# Patient Record
Sex: Female | Born: 1965 | Race: White | Hispanic: No | Marital: Married
Health system: Southern US, Community
[De-identification: ages and names within clinical notes are randomized; demographics above are authoritative.]

## PROBLEM LIST (undated history)

## (undated) DIAGNOSIS — R251 Tremor, unspecified: Secondary | ICD-10-CM

## (undated) DIAGNOSIS — F99 Mental disorder, not otherwise specified: Secondary | ICD-10-CM

## (undated) DIAGNOSIS — IMO0002 Reserved for concepts with insufficient information to code with codable children: Secondary | ICD-10-CM

## (undated) DIAGNOSIS — G894 Chronic pain syndrome: Secondary | ICD-10-CM

## (undated) DIAGNOSIS — F32A Depression, unspecified: Secondary | ICD-10-CM

## (undated) DIAGNOSIS — Z889 Allergy status to unspecified drugs, medicaments and biological substances status: Secondary | ICD-10-CM

## (undated) DIAGNOSIS — R202 Paresthesia of skin: Secondary | ICD-10-CM

## (undated) DIAGNOSIS — M21612 Bunion of left foot: Secondary | ICD-10-CM

## (undated) DIAGNOSIS — IMO0001 Reserved for inherently not codable concepts without codable children: Secondary | ICD-10-CM

## (undated) DIAGNOSIS — N809 Endometriosis, unspecified: Secondary | ICD-10-CM

## (undated) DIAGNOSIS — F419 Anxiety disorder, unspecified: Secondary | ICD-10-CM

## (undated) DIAGNOSIS — F410 Panic disorder [episodic paroxysmal anxiety] without agoraphobia: Secondary | ICD-10-CM

## (undated) DIAGNOSIS — R87619 Unspecified abnormal cytological findings in specimens from cervix uteri: Secondary | ICD-10-CM

## (undated) DIAGNOSIS — R87629 Unspecified abnormal cytological findings in specimens from vagina: Secondary | ICD-10-CM

## (undated) DIAGNOSIS — K219 Gastro-esophageal reflux disease without esophagitis: Secondary | ICD-10-CM

## (undated) DIAGNOSIS — C801 Malignant (primary) neoplasm, unspecified: Secondary | ICD-10-CM

## (undated) DIAGNOSIS — R200 Anesthesia of skin: Secondary | ICD-10-CM

## (undated) DIAGNOSIS — Z22322 Carrier or suspected carrier of Methicillin resistant Staphylococcus aureus: Secondary | ICD-10-CM

## (undated) DIAGNOSIS — F329 Major depressive disorder, single episode, unspecified: Secondary | ICD-10-CM

## (undated) HISTORY — DX: Endometriosis, unspecified: N80.9

## (undated) HISTORY — DX: Unspecified abnormal cytological findings in specimens from cervix uteri: R87.619

## (undated) HISTORY — DX: Chronic pain syndrome: G89.4

## (undated) HISTORY — DX: Carrier or suspected carrier of methicillin resistant Staphylococcus aureus: Z22.322

## (undated) HISTORY — PX: DILATION AND CURETTAGE OF UTERUS: SHX78

## (undated) HISTORY — PX: ROTATOR CUFF REPAIR: SHX139

## (undated) HISTORY — PX: WISDOM TOOTH EXTRACTION: SHX21

---

## 1997-12-29 ENCOUNTER — Inpatient Hospital Stay (HOSPITAL_COMMUNITY): Admission: AD | Admit: 1997-12-29 | Discharge: 1997-12-31 | Payer: Self-pay | Admitting: Obstetrics & Gynecology

## 1998-04-09 HISTORY — PX: NASAL SINUS SURGERY: SHX719

## 1998-11-04 ENCOUNTER — Other Ambulatory Visit: Admission: RE | Admit: 1998-11-04 | Discharge: 1998-11-04 | Payer: Self-pay | Admitting: *Deleted

## 1998-11-04 ENCOUNTER — Encounter (INDEPENDENT_AMBULATORY_CARE_PROVIDER_SITE_OTHER): Payer: Self-pay | Admitting: Specialist

## 1999-06-08 ENCOUNTER — Other Ambulatory Visit: Admission: RE | Admit: 1999-06-08 | Discharge: 1999-06-08 | Payer: Self-pay | Admitting: Obstetrics & Gynecology

## 1999-07-16 ENCOUNTER — Emergency Department (HOSPITAL_COMMUNITY): Admission: EM | Admit: 1999-07-16 | Discharge: 1999-07-16 | Payer: Self-pay | Admitting: Emergency Medicine

## 1999-10-13 ENCOUNTER — Encounter: Payer: Self-pay | Admitting: Neurosurgery

## 1999-10-13 ENCOUNTER — Ambulatory Visit (HOSPITAL_COMMUNITY): Admission: RE | Admit: 1999-10-13 | Discharge: 1999-10-13 | Payer: Self-pay | Admitting: Neurosurgery

## 1999-12-07 ENCOUNTER — Encounter: Payer: Self-pay | Admitting: Neurosurgery

## 1999-12-07 ENCOUNTER — Ambulatory Visit (HOSPITAL_COMMUNITY): Admission: RE | Admit: 1999-12-07 | Discharge: 1999-12-07 | Payer: Self-pay | Admitting: Neurosurgery

## 1999-12-09 HISTORY — PX: NECK SURGERY: SHX720

## 1999-12-22 ENCOUNTER — Inpatient Hospital Stay (HOSPITAL_COMMUNITY): Admission: RE | Admit: 1999-12-22 | Discharge: 1999-12-23 | Payer: Self-pay | Admitting: Neurosurgery

## 1999-12-22 ENCOUNTER — Encounter: Payer: Self-pay | Admitting: Neurosurgery

## 2000-01-12 ENCOUNTER — Encounter: Admission: RE | Admit: 2000-01-12 | Discharge: 2000-01-12 | Payer: Self-pay | Admitting: Neurosurgery

## 2000-01-12 ENCOUNTER — Encounter: Payer: Self-pay | Admitting: Neurosurgery

## 2000-02-22 ENCOUNTER — Other Ambulatory Visit (HOSPITAL_COMMUNITY): Admission: RE | Admit: 2000-02-22 | Discharge: 2000-03-18 | Payer: Self-pay | Admitting: Psychiatry

## 2000-10-03 ENCOUNTER — Other Ambulatory Visit: Admission: RE | Admit: 2000-10-03 | Discharge: 2000-10-03 | Payer: Self-pay | Admitting: Obstetrics & Gynecology

## 2000-10-07 HISTORY — PX: TUBAL LIGATION: SHX77

## 2000-10-31 ENCOUNTER — Ambulatory Visit (HOSPITAL_COMMUNITY): Admission: RE | Admit: 2000-10-31 | Discharge: 2000-10-31 | Payer: Self-pay | Admitting: Obstetrics and Gynecology

## 2000-10-31 ENCOUNTER — Encounter (INDEPENDENT_AMBULATORY_CARE_PROVIDER_SITE_OTHER): Payer: Self-pay | Admitting: *Deleted

## 2002-03-07 ENCOUNTER — Emergency Department (HOSPITAL_COMMUNITY): Admission: EM | Admit: 2002-03-07 | Discharge: 2002-03-08 | Payer: Self-pay | Admitting: Emergency Medicine

## 2002-07-01 ENCOUNTER — Encounter: Payer: Self-pay | Admitting: Emergency Medicine

## 2002-07-01 ENCOUNTER — Emergency Department (HOSPITAL_COMMUNITY): Admission: EM | Admit: 2002-07-01 | Discharge: 2002-07-01 | Payer: Self-pay | Admitting: Emergency Medicine

## 2002-08-26 ENCOUNTER — Emergency Department (HOSPITAL_COMMUNITY): Admission: EM | Admit: 2002-08-26 | Discharge: 2002-08-26 | Payer: Self-pay

## 2003-06-17 ENCOUNTER — Emergency Department (HOSPITAL_COMMUNITY): Admission: EM | Admit: 2003-06-17 | Discharge: 2003-06-17 | Payer: Self-pay | Admitting: *Deleted

## 2003-07-29 ENCOUNTER — Emergency Department (HOSPITAL_COMMUNITY): Admission: EM | Admit: 2003-07-29 | Discharge: 2003-07-29 | Payer: Self-pay | Admitting: Emergency Medicine

## 2003-09-02 ENCOUNTER — Emergency Department (HOSPITAL_COMMUNITY): Admission: EM | Admit: 2003-09-02 | Discharge: 2003-09-02 | Payer: Self-pay | Admitting: Emergency Medicine

## 2003-09-10 ENCOUNTER — Emergency Department (HOSPITAL_COMMUNITY): Admission: EM | Admit: 2003-09-10 | Discharge: 2003-09-10 | Payer: Self-pay | Admitting: Emergency Medicine

## 2004-06-12 ENCOUNTER — Emergency Department (HOSPITAL_COMMUNITY): Admission: EM | Admit: 2004-06-12 | Discharge: 2004-06-12 | Payer: Self-pay | Admitting: Emergency Medicine

## 2004-07-01 ENCOUNTER — Emergency Department (HOSPITAL_COMMUNITY): Admission: EM | Admit: 2004-07-01 | Discharge: 2004-07-01 | Payer: Self-pay | Admitting: Family Medicine

## 2004-07-08 ENCOUNTER — Inpatient Hospital Stay (HOSPITAL_COMMUNITY): Admission: EM | Admit: 2004-07-08 | Discharge: 2004-07-10 | Payer: Self-pay | Admitting: Psychiatry

## 2004-07-08 ENCOUNTER — Ambulatory Visit: Payer: Self-pay | Admitting: Psychiatry

## 2004-10-14 ENCOUNTER — Emergency Department (HOSPITAL_COMMUNITY): Admission: EM | Admit: 2004-10-14 | Discharge: 2004-10-14 | Payer: Self-pay | Admitting: Family Medicine

## 2005-01-16 ENCOUNTER — Emergency Department (HOSPITAL_COMMUNITY): Admission: EM | Admit: 2005-01-16 | Discharge: 2005-01-17 | Payer: Self-pay | Admitting: Emergency Medicine

## 2005-03-24 ENCOUNTER — Emergency Department (HOSPITAL_COMMUNITY): Admission: EM | Admit: 2005-03-24 | Discharge: 2005-03-24 | Payer: Self-pay | Admitting: Emergency Medicine

## 2005-05-03 ENCOUNTER — Emergency Department (HOSPITAL_COMMUNITY): Admission: EM | Admit: 2005-05-03 | Discharge: 2005-05-03 | Payer: Self-pay | Admitting: Emergency Medicine

## 2005-07-12 ENCOUNTER — Emergency Department (HOSPITAL_COMMUNITY): Admission: EM | Admit: 2005-07-12 | Discharge: 2005-07-12 | Payer: Self-pay | Admitting: *Deleted

## 2005-07-13 ENCOUNTER — Emergency Department (HOSPITAL_COMMUNITY): Admission: EM | Admit: 2005-07-13 | Discharge: 2005-07-13 | Payer: Self-pay | Admitting: Emergency Medicine

## 2005-07-26 ENCOUNTER — Inpatient Hospital Stay (HOSPITAL_COMMUNITY): Admission: EM | Admit: 2005-07-26 | Discharge: 2005-07-28 | Payer: Self-pay | Admitting: Emergency Medicine

## 2005-11-01 ENCOUNTER — Emergency Department (HOSPITAL_COMMUNITY): Admission: EM | Admit: 2005-11-01 | Discharge: 2005-11-02 | Payer: Self-pay | Admitting: Emergency Medicine

## 2005-12-21 ENCOUNTER — Emergency Department (HOSPITAL_COMMUNITY): Admission: EM | Admit: 2005-12-21 | Discharge: 2005-12-21 | Payer: Self-pay | Admitting: Family Medicine

## 2006-01-07 ENCOUNTER — Emergency Department (HOSPITAL_COMMUNITY): Admission: EM | Admit: 2006-01-07 | Discharge: 2006-01-07 | Payer: Self-pay | Admitting: Family Medicine

## 2006-02-16 ENCOUNTER — Inpatient Hospital Stay (HOSPITAL_COMMUNITY): Admission: EM | Admit: 2006-02-16 | Discharge: 2006-02-21 | Payer: Self-pay | Admitting: Emergency Medicine

## 2006-04-17 ENCOUNTER — Emergency Department (HOSPITAL_COMMUNITY): Admission: EM | Admit: 2006-04-17 | Discharge: 2006-04-18 | Payer: Self-pay | Admitting: Emergency Medicine

## 2008-04-11 ENCOUNTER — Emergency Department (HOSPITAL_BASED_OUTPATIENT_CLINIC_OR_DEPARTMENT_OTHER): Admission: EM | Admit: 2008-04-11 | Discharge: 2008-04-11 | Payer: Self-pay | Admitting: Emergency Medicine

## 2008-05-03 ENCOUNTER — Emergency Department (HOSPITAL_BASED_OUTPATIENT_CLINIC_OR_DEPARTMENT_OTHER): Admission: EM | Admit: 2008-05-03 | Discharge: 2008-05-03 | Payer: Self-pay | Admitting: Emergency Medicine

## 2008-05-03 ENCOUNTER — Ambulatory Visit: Payer: Self-pay | Admitting: Diagnostic Radiology

## 2008-05-04 ENCOUNTER — Emergency Department (HOSPITAL_BASED_OUTPATIENT_CLINIC_OR_DEPARTMENT_OTHER): Admission: EM | Admit: 2008-05-04 | Discharge: 2008-05-04 | Payer: Self-pay | Admitting: Emergency Medicine

## 2008-06-09 ENCOUNTER — Emergency Department (HOSPITAL_BASED_OUTPATIENT_CLINIC_OR_DEPARTMENT_OTHER): Admission: EM | Admit: 2008-06-09 | Discharge: 2008-06-09 | Payer: Self-pay | Admitting: Emergency Medicine

## 2008-06-09 ENCOUNTER — Ambulatory Visit: Payer: Self-pay | Admitting: Interventional Radiology

## 2008-09-01 ENCOUNTER — Emergency Department (HOSPITAL_COMMUNITY): Admission: EM | Admit: 2008-09-01 | Discharge: 2008-09-02 | Payer: Self-pay | Admitting: Emergency Medicine

## 2008-12-04 ENCOUNTER — Emergency Department (HOSPITAL_COMMUNITY): Admission: EM | Admit: 2008-12-04 | Discharge: 2008-12-04 | Payer: Self-pay | Admitting: Emergency Medicine

## 2009-01-03 ENCOUNTER — Emergency Department (HOSPITAL_COMMUNITY): Admission: EM | Admit: 2009-01-03 | Discharge: 2009-01-03 | Payer: Self-pay | Admitting: Family Medicine

## 2009-01-29 ENCOUNTER — Emergency Department (HOSPITAL_COMMUNITY): Admission: EM | Admit: 2009-01-29 | Discharge: 2009-01-29 | Payer: Self-pay | Admitting: Emergency Medicine

## 2009-07-21 ENCOUNTER — Ambulatory Visit: Payer: Self-pay | Admitting: Internal Medicine

## 2009-08-24 ENCOUNTER — Ambulatory Visit: Payer: Self-pay | Admitting: Family Medicine

## 2009-08-24 ENCOUNTER — Encounter (INDEPENDENT_AMBULATORY_CARE_PROVIDER_SITE_OTHER): Payer: Self-pay | Admitting: Family Medicine

## 2009-08-24 LAB — CONVERTED CEMR LAB
Cholesterol: 160 mg/dL (ref 0–200)
Eosinophils Absolute: 0.2 10*3/uL (ref 0.0–0.7)
Eosinophils Relative: 2 % (ref 0–5)
Lymphocytes Relative: 27 % (ref 12–46)
MCHC: 32.4 g/dL (ref 30.0–36.0)
Monocytes Absolute: 0.8 10*3/uL (ref 0.1–1.0)
Neutrophils Relative %: 63 % (ref 43–77)
Total CHOL/HDL Ratio: 2.9
Triglycerides: 95 mg/dL (ref <150)
VLDL: 19 mg/dL (ref 0–40)

## 2009-09-30 ENCOUNTER — Ambulatory Visit (HOSPITAL_COMMUNITY): Admission: RE | Admit: 2009-09-30 | Discharge: 2009-09-30 | Payer: Self-pay | Admitting: Family Medicine

## 2009-10-11 ENCOUNTER — Encounter (INDEPENDENT_AMBULATORY_CARE_PROVIDER_SITE_OTHER): Payer: Self-pay | Admitting: Family Medicine

## 2009-10-11 ENCOUNTER — Ambulatory Visit: Payer: Self-pay | Admitting: Internal Medicine

## 2009-10-11 LAB — CONVERTED CEMR LAB
Chlamydia, DNA Probe: NEGATIVE
Hep B Core Total Ab: NEGATIVE

## 2009-10-19 ENCOUNTER — Ambulatory Visit (HOSPITAL_COMMUNITY): Admission: RE | Admit: 2009-10-19 | Discharge: 2009-10-19 | Payer: Self-pay | Admitting: Internal Medicine

## 2010-04-30 ENCOUNTER — Encounter: Payer: Self-pay | Admitting: Family Medicine

## 2010-07-13 LAB — SALICYLATE LEVEL: Salicylate Lvl: 7 mg/dL (ref 2.8–20.0)

## 2010-07-13 LAB — URINE MICROSCOPIC-ADD ON

## 2010-07-13 LAB — RAPID URINE DRUG SCREEN, HOSP PERFORMED: Tetrahydrocannabinol: NOT DETECTED

## 2010-07-13 LAB — BASIC METABOLIC PANEL
BUN: 9 mg/dL (ref 6–23)
Calcium: 8.5 mg/dL (ref 8.4–10.5)
GFR calc Af Amer: >60 mL/min (ref >60)
Glucose, Bld: 81 mg/dL (ref 70–99)
Potassium: 3.3 mEq/L — ABNORMAL LOW (ref 3.5–5.1)
Sodium: 135 mEq/L (ref 135–145)

## 2010-07-13 LAB — URINALYSIS, ROUTINE W REFLEX MICROSCOPIC
Specific Gravity, Urine: 1.012 (ref 1.005–1.030)
Urobilinogen, UA: 0.2 mg/dL (ref 0.0–1.0)
pH: 5.5 (ref 5.0–8.0)

## 2010-07-13 LAB — CBC
MCHC: 33.9 g/dL (ref 30.0–36.0)
RDW: 14.1 % (ref 11.5–15.5)
WBC: 12.8 10*3/uL — ABNORMAL HIGH (ref 4.0–10.5)

## 2010-07-13 LAB — TRICYCLICS SCREEN, URINE: TCA Scrn: NOT DETECTED

## 2010-07-13 LAB — ETHANOL: Alcohol, Ethyl (B): <5 mg/dL (ref 0–10)

## 2010-07-21 ENCOUNTER — Emergency Department (HOSPITAL_BASED_OUTPATIENT_CLINIC_OR_DEPARTMENT_OTHER)
Admission: EM | Admit: 2010-07-21 | Discharge: 2010-07-21 | Disposition: A | Discharge status: Home / Self Care | Payer: Self-pay | Attending: Emergency Medicine | Admitting: Emergency Medicine

## 2010-07-21 DIAGNOSIS — M542 Cervicalgia: Secondary | ICD-10-CM | POA: Insufficient documentation

## 2010-07-24 LAB — BASIC METABOLIC PANEL
BUN: 10 mg/dL (ref 6–23)
CO2: 24 mEq/L (ref 19–32)
GFR calc non Af Amer: >60 mL/min (ref >60)
Glucose, Bld: 77 mg/dL (ref 70–99)
Sodium: 135 mEq/L (ref 135–145)

## 2010-07-24 LAB — URINALYSIS, ROUTINE W REFLEX MICROSCOPIC
Bilirubin Urine: NEGATIVE
Ketones, ur: >80 mg/dL — AB
Nitrite: NEGATIVE
Protein, ur: NEGATIVE mg/dL
Urobilinogen, UA: 0.2 mg/dL (ref 0.0–1.0)

## 2010-07-24 LAB — CBC
HCT: 42.8 % (ref 36.0–46.0)
Hemoglobin: 14.5 g/dL (ref 12.0–15.0)
MCV: 90.3 fL (ref 78.0–100.0)
Platelets: 312 10*3/uL (ref 150–400)
RBC: 4.74 MIL/uL (ref 3.87–5.11)
RDW: 12.9 % (ref 11.5–15.5)

## 2010-07-24 LAB — POCT TOXICOLOGY PANEL: TCA Scrn: POSITIVE

## 2010-07-24 LAB — DIFFERENTIAL
Eosinophils Absolute: 0 10*3/uL (ref 0.0–0.7)
Monocytes Relative: 3 % (ref 3–12)
Neutro Abs: 12.3 10*3/uL — ABNORMAL HIGH (ref 1.7–7.7)
Neutrophils Relative %: 91 % — ABNORMAL HIGH (ref 43–77)

## 2010-07-24 LAB — PREGNANCY, URINE: Preg Test, Ur: NEGATIVE

## 2010-07-24 LAB — SALICYLATE LEVEL: Salicylate Lvl: 2 mg/dL — ABNORMAL LOW (ref 2.8–20.0)

## 2010-08-25 NOTE — H&P (Signed)
Stephanie Gregory, Stephanie Gregory NO.:  [id number]   MEDICAL RECORD NO.:  [id number]          PATIENT TYPE:  IPS   LOCATION:  0508                          FACILITY:  [facility]   PHYSICIAN:  Jeanice Lim, M.D. DATE OF BIRTH:  January 09, 1966   DATE OF ADMISSION:  07/08/2004  DATE OF DISCHARGE:                         PSYCHIATRIC ADMISSION ASSESSMENT   IDENTIFYING INFORMATION:  This is a 45 year old divorced white female.  She  is involuntarily admitted to the services of Dr. Kathrynn Running.   HISTORY OF PRESENT ILLNESS:  She is a 45 year old divorced white female.  Apparently she overdosed on some medications.  She sent several text  messages and left voice mails telling friends and family goodbye.  They also  found goodbye notes.  The patient states that she had been up all night as  she had argued with her boyfriend.  Her mother was called who called the  police.  The police went in and found the patient.  The patient was groggy,  could not be understood.  [place] [employer] brought her to [employer].  Apparently she had been assessed earlier at [facility] and had been sent home and then overdosed.  She states that she did  it to get attention.  She states that she needs to get a job, be consistent.  She has no motivation for life at times.  She says that being here is not  being very helpful as she is not allowed to smoke, and smoking really helps  calm her down.  She states she needs to see her 43 year old.  Her 15-year-  old daughter gets a Radio producer and is ready to leave because she  is tired of the patient's behavior.   PAST PSYCHIATRIC HISTORY:  The patient had one admission previously at [facility]  about 2 years ago for alcohol detox.  She is followed at the [facility].  She is currently under care with Dr. Mila Homer, and apparently keeps her  appointments and takes her medications as prescribed.   SOCIAL HISTORY:  She has had some college.   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  She has had difficulty  maintaining employment since then.  Her older 2 children, her daughter who  is 72 and a 79 year old son, their father has died.  They daughter generally  lives with the patient, and the 43 year old is living with his paternal  grandparents.  Apparently that grandfather recently died just leaving the  patient's ex-mother-in-law.  She also has a 62-year-old son who stays with  his father.   FAMILY HISTORY:  She states her father is an alcoholic and also abuses  drugs.   ALCOHOL AND DRUG HISTORY:  She smokes 1 pack of cigarettes per day.  She has  a sponsor for NA but has not been going.   PRIMARY CARE Michale Emmerich:  She recently had gotten Medicaid, and her primary  care Lanard Arguijo is [facility] out on [address].  She is treated for  migraines there.  CURRENT MEDICATIONS:  1.  Xanax 0.5 mg t.i.d.  2.  Effexor XR 150 mg 2 in the morning.  3.  Gabapentin 300 mg q.i.d.  4.  Wellbutrin XL 150 mg q.a.m.  5.  Remeron 15 mg 1/2 to 1 at h.s.  6.  Ambien CR 12.5 mg at h.s.   MEDICAL PROBLEMS:  Migraines, being treated by [facility].   ALLERGIES:  She denies any drug allergies.   PHYSICAL EXAMINATION:  As per the ER.  Basically her urine drug screen was  positive for benzodiazepine and barbiturates.  Her alcohol level was less  than 5.   MENTAL STATUS EXAM:  Today she is alert and oriented x 3.  She is casually  groomed and dressed, adequately nourished.  Her speech is a normal rate,  rhythm and tone.  Her mood is depressed and anxious.  Her affect is  congruent with being depressed and anxious.  Thought processes are clear,  rational and goal-oriented.  She is requesting discharge so she can get a  job and minimize the rest of the damage.  Judgment and insight are fair.  Concentration and memory are intact.  Intelligence is at least average.  Today she is denying  suicidal or homicidal ideation.  She states that she  did that yesterday because she was feeling sorry for herself and wanted  attention.  She denies auditory or visual hallucinations.   ADMISSION DIAGNOSES:   AXIS I:  1.  Major depressive disorder, recurrent, severe, without psychotic      features.  2.  History for opiates and benzodiazepine addiction.  3.  Panic attacks.   AXIS II:  Deferred.   AXIS III:  1.  Migraine headache.  2.  Insomnia.  3.  Status post degenerative disk disease in neck with surgery.   AXIS IV:  Severe, problems with primary support group.  Patient has had  problems legally in the past with going to multiple doctors, medications,  etc.   AXIS V:  28.   PLAN:  The plan is to admit for safety and stabilization.  She denies any  need to adjust her medications today.  She is requesting discharge.  I told  her she would have to take that up with the attending psychiatrist in the  morning.      MD/MEDQ  D:  07/09/2004  T:  07/09/2004  Job:  [id number]

## 2010-08-25 NOTE — Discharge Summary (Signed)
[name], [name] NO.:  [id number]   MEDICAL RECORD NO.:  [id number]          PATIENT TYPE:  INP   LOCATION:  6714                         FACILITY:  [facility]   PHYSICIAN:  Michaelyn Barter, M.D. DATE OF BIRTH:  09/05/1965   DATE OF ADMISSION:  07/26/2005  DATE OF DISCHARGE:  07/28/2005                                 DISCHARGE SUMMARY   PRIMARY CARE PHYSICIAN:  Unassigned.   DISCHARGE DIAGNOSES:  1.  Suicide attempt.  2.  Acute respiratory failure.  3.  Depression.  4.  Headache.   CONSULTATIONS:  Psychiatry, Dr. Jeanie Sewer.   HISTORY OF PRESENT ILLNESS:  Stephanie Gregory is a 45 year old female who was  brought into the ER department by police after she had an altercation with  her landlord.  Earlier in the day, she took an unspecified amount of tablets  and it was noted that she had become less responsive while in the emergency  department.  The patient's boyfriend stated that the patient had threatened  to kill herself over the few days leading up to this admission.  Therefore,  he signed commitment papers on [name].  It is reported that the patient  had taken Xanax along with some street drugs.   For past medical history, please see that dictated by Dr. Lonia Blood on  [phone].   HOSPITAL COURSE:  Problem 1.  RESPIRATORY FAILURE:  Orders were for the  patient to be admitted into the intensive care unit, however, she was  admitted and went to the medicine floor secondary to respiratory failure.  This was believed to be secondary to a drug overdose.  The day following the  patient's admission into the hospital, she showed no obvious signs of  respiratory compromise.  There was no shortness of breath.  She appeared to  be awake and there were no signs of impending respiratory failure.  She was  provided with oxygen and monitored closely.   Problem 2.  SUICIDE ATTEMPT:  The patient was provided with a one-to-one  sitter and psychiatry was consulted.   Dr. Jeanie Sewer saw the patient and  recommended that the patient seek inpatient treatment due to the patient's  history of multiple relapses and destructive consequences despite outpatient  program/12-step treatment.  However, he stated that the patient had declined  and she was not committable at that time.  He went on to state that she had  recovered from her intoxication.  The patient told him that she would lose  her new job and she needed to go back tomorrow.  However, she agreed to 12-  step groups and she would follow up with her own psychiatric physician at  [facility] on Friday, April 27, for the monitoring of her  Effexor.  His final diagnosis was that the patient suffered from an Axis I  Adjustment disorder with MBEC that appeared to be resolved.  She also  suffered from opioid dependence, and she had a history of depression.   Problem 3.  HEADACHE:  The patient was provided with p.r.n. medication for  this.  Later on April21,2007, the patient signed out AMA.      Michaelyn Barter, M.D.  Electronically Signed     OR/MEDQ  D:  09/06/2005  T:  09/06/2005  Job:  [id number]

## 2010-08-25 NOTE — Discharge Summary (Signed)
NAMENAUREEN, Stephanie Gregory NO.:  [id number]   MEDICAL RECORD NO.:  [id number]          PATIENT TYPE:  IPS   LOCATION:  0508                          [facility]ACILITY:  [facility]   PHYSICIAN:  Geoffery Lyons, M.D.      DATE OF BIRTH:  [birth year]   DATE OF ADMISSION:  07/08/2004  DATE OF DISCHARGE:  07/10/2004                                 DISCHARGE SUMMARY   CHIEF COMPLAINT/HISTORY OF PRESENT ILLNESS:  This was the first admission to  [facility] for this 45 year old divorced white  female involuntarily admitted.  She overdosed on some medications, sent  several text messages, left voice mails telling friends and family goodbye.  There was a final goodbye note.  Apparently she had been up all night, she  had argued with her boyfriend.  Her mother was called who called the police.  The police went in and she was found groggy, could not be understood.  She  was brought to the emergency department.  She apparently had been seen  earlier at [facility], had been sent home and then overdosed.  She  claimed she needed to get attention.  She endorsed that she needed to get a  job and needed to be consistent.  Endorsed she had motivation for life at  times.   PAST PSYCHIATRIC HISTORY:  She had been previously in [facility] 2 years prior to  this admission for alcohol detox.  [facility] up at the [facility].   MEDICAL HISTORY:  Migraines.   MEDICATIONS:  1.  Xanax 0.5 mg three times a day.  2.  Effexor XR 150 mg 2 in the morning.  3.  Neurontin 300 mg four times a day.  4.  Wellbutrin XR 150 mg in the morning.  5.  Remeron 15 mg 1/2 to 1 tablet at night.  6.  Ambien CR 12.5 mg at night.   PHYSICAL EXAMINATION:  Performed and failed to show any acute findings.   LABORATORY WORKUP:  CBC within normal limits.  Blood chemistry, potassium  3.4, BUN 4, SGOT 14, SGPT 11, TSH 1.075.   MENTAL STATUS EXAM:  Reveals a very cooperative female casually dressed,  adequately nourished.  Speech was normal rate, rhythm and tone.  Mood was  depressed and anxious.  Affect was congruent with being depressed and  anxious.  Thought processes were clear, rational and goal oriented.  She was  requesting discharge so she can get a job and minimize the rest of the  damage.  Judgment and insight are fair, no delusions, no hallucinations.  Cognition was well preserved.   ADMISSION DIAGNOSES:   AXIS I:  1.  Major depression, recurrent.  2.  Panic attacks.  3.  Opiate and benzodiazepine abuse, rule out.   AXIS II:  No diagnosis.   AXIS III:  Migraine headaches post degenerative disk disease in neck, status  post surgery.   AXIS IV:  Moderate.   AXIS V:  Upon admission 25, highest in the last year 60.   COURSE IN HOSPITAL:  She was admitted.  She was  started in individual and  group psychotherapy.  She was maintained on the Xanax 0.5 twice a day and at  night.  She was given Wellbutrin XR 150 mg in the morning and Effexor XR 150  mg 2 in the morning, Neurontin 300 mg four times a day.  Ambien CR 12.5 mg  daily, Remeron 15 mg 1/2 to 1 at night and Lidoderm patch.  She required  some Demerol for acute migraine. She was also given some Fioricet.  Upon  admission she settled down.  She endorsed that she was not suicidal and she  felt that she needed to be discharged, and she needed to go home to get a  job to take care of multiple things.  She was going to stay with her  grandmother which was a safe environment for her.  She was also going to  pursue outpatient treatment.  There was a family session with her mother.  She denied any suicidal or homicidal ideations.  There was communication  that she had to regain trust from the family members that she was going to  be able to follow up and stay drug free.  She was committed to abstinence,  going back to NA.   DISCHARGE MEDICATIONS:  1.  Xanax 0.5 twice a day and at night as needed for sleep.  2.  Effexor  XR 150 mg 2 daily.  3.  Neurontin 300 mg 1 four times a day.  4.  Ambien CR 12.5 mg at night.  5.  Remeron 15 mg 1 tablet at night.  6.  Fioricet 2 tablets by mouth every 4 hours as needed for pain.   FOLLOW UP:  She will follow up at the [facility].   DISCHARGE DIAGNOSES:   AXIS I:  1.  Major depression, recurrent.  2.  Anxiety disorder, not otherwise specified.   AXIS II:  No diagnosis.   AXIS III:  1.  Migraine headache.  2.  Degenerative disk disease in the neck status post surgery.   AXIS IV:  Moderate.   AXIS V:  Global assessment of function upon discharge 50.      IL/MEDQ  D:  08/01/2004  T:  08/02/2004  Job:  [id number]

## 2010-08-25 NOTE — Op Note (Signed)
Stephanie Gregory, Stephanie Gregory NO.:  [id number]   MEDICAL RECORD NO.:  [id number]          PATIENT TYPE:  INP   LOCATION:  5037                         FACILITY:  [facility]   PHYSICIAN:  Dionne Ano. Gramig III, M.D.DATE OF BIRTH:  22-Oct-1965   DATE OF PROCEDURE:  02/16/2006  DATE OF DISCHARGE:                                 OPERATIVE REPORT   PREOPERATIVE DIAGNOSIS:  Deep abscess, right hand, with ascending  cellulitis.   POSTOPERATIVE DIAGNOSIS:  Deep abscess, right hand, with ascending  cellulitis.   PROCEDURE:  Incision and drainage, right hand, over the proximal phalanx of  the small finger, with noted deep abscess evacuated.   SURGEON:  Dionne Ano. Amanda Pea, M.D.   ASSISTANT:  None.   COMPLICATIONS:  None.   ANESTHESIA:  General.   TOURNIQUET TIME:  Zero.   INDICATIONS FOR PROCEDURE:  This patient is a 45 year old female, who  presents with a deep abscess  with ascending cellulitis and aggressive  infection features about the small finger, right hand.  I have counseled her  in regard to the risks an benefits of surgery, including risks of infection,  bleeding, anesthesia, damage to normal structures and failure of the surgery  to accomplish its intended goals of alleviating symptoms and restoring  function.  With this in mind, she desires to proceed.  All questions have  been encouraged and answered preoperatively.   OPERATIVE PROCEDURE:  The patient was seen by myself and Anesthesia.  The  operative extremity was marked, identified.  Preoperative risks and benefits  were discussed, and she was taken to the operating suite and underwent a  smooth induction of anesthesia, laid supine, appropriately padded and  prepped and draped in the usual sterile fashion with Betadine Scrub and  Paint about the right upper extremity.  Once this was done, incision was  made over the dorsal aspect of the small finger proximal phalanx region.  Dissection was carried down deeply.   A large abscess was evacuated.  This  was cultured for aerobic and anaerobic culture, and following this, copious  irrigation was applied to the wound, greater than a liter.  Once this was  done, I then packed the wound with Iodoform gauze, placed Xeroform about the  skin, and then placed her in a comfort dressing.  She tolerated this well.  Refill was excellent.  There were no complicating features.  This was an I&D  of a deep abscess.  I will begin  vancomycin, as well as Unasyn.  Will plan to have her admitted for IV  antibiotics, pain management, observation, and will monitor her condition  closely.  It has been a pleasure to participate in her care.  All questions  have been encouraged and answered.           ______________________________  Dionne Ano. Everlene Other, M.D.     Nash Mantis  D:  02/16/2006  T:  02/17/2006  Job:  [id number]

## 2010-08-25 NOTE — Discharge Summary (Signed)
Stephanie Gregory, Stephanie Gregory NO.:  [id number]   MEDICAL RECORD NO.:  [id number]          PATIENT TYPE:  INP   LOCATION:  5037                         FACILITY:  [facility]   PHYSICIAN:  Karie Chimera, P.A.-C.DATE OF BIRTH:  [birth year]   DATE OF ADMISSION:  02/16/2006  DATE OF DISCHARGE:  02/21/2006                               DISCHARGE SUMMARY   ADMITTING DIAGNOSIS:  1. Right hand, small finger deep abscess.  2. History of alcoholism.  3. History of migraines.  4. History of depression.   DISCHARGE DIAGNOSES:  1. Right hand, small finger deep abscess.  2. History of alcoholism.  3. History of migraines.  4. History of depression.  5. Noted findings for MRSA (methicillin-resistant Staphylococcus      aureus).   SURGEON:  Dominica Severin, MD.   CONSULTS:  None.   PROCEDURE:  I&D to the right hand and small finger, with cultures  indicative of methicillin-resistant Staphylococcus aureus.   COMPLICATIONS:  None.   BRIEF HISTORY OF THE PRESENT ILLNESS:  Stephanie Gregory is a 45 year old  female who presented to the [facility] Emergency Room on February 16, 2006 for evaluation of her right hand.  She presented with increasing  pain, swelling and redness over the right hand and small finger,  beginning, per her description, the previous Thursday.  Given her  worsening complaints, her symptoms are giving her worsening pains so she  presented to the emergency room for evaluation.  Was noted to have a  significant abscess requiring operative intervention, as evaluated by  hand and upper extremity surgeon, Dionne Ano. Gramig, MD.  The patient  was noted to have a prior MRSA infection per old hospital record, and  was treated in the past for MRSA.  Patient was noted to have signs of  ascending cellulitis.  Preoperative labs were obtained.  Showed her WBC  was 12.9, H&H stable at 13.4 and 13.2, platelets were 270.  Decision was  made to proceed with operative intervention for  definitive treatment.  Patient was admitted on February 16, 2006.   HOSPITAL COURSE:  Upon her admit on February 16, 2006, the patient did  undergo surgical I&D with intraoperative cultures obtained.  Initial  Gram stain showed gram-positive cocci, and subsequent final cultures did  reveal MRSA, and the patient was prophylactically initially started on  vancomycin per pharmacy dosing and watched carefully.  She underwent  multiple bedside I&Ds and daily wound care for therapy.  The patient did  have a fair amount of pain control difficulties, and later in her  hospital course did relate to [place] that she had a prior history of  narcotic abuse.  Her pain was treated appropriately.  She continued  daily wound care, and her wound did progress nicely without continued  ascending cellulitis.  Patient did have an episode of hematuria while in  the hospital, but states that she had a history of hematochezia while in  the hospital this stay, but she did have a history of hemorrhoid.  There  was no gross blood present, and this resolved after one bowel  movement.  Her vital signs remained stable.  She was afebrile.  She was  intermittently slightly tachycardic.  Vancomycin was continued during  her inpatient admit.  Sensitivities were noted to tetracycline per  outpatient antibiotic regimes.  Diligent wound care was performed  throughout her hospital course, and postoperative day #5 she was doing  very well.  Her wound was stable and no signs of active infection.  She  denied any nausea, vomiting, fevers or chills.  She was walking without  difficulty.  Tolerating p.o.'s without difficulty.  She denied any  recurrent bright red blood per rectum.  She was afebrile.  Chest was  clear to auscultation.  Heart:  S1, S2.  Slightly tachycardic.  Her  abdomen was nontender.  Bowel sounds were positive.  Evaluation of the  wound showed upon her dressing change she had only local erythema.  No  purulence  noted.  No ascending cellulitis.  Range of motion intact.  Sensation, refill was intact.   ASSESSMENT/FINAL DIAGNOSIS:  1. Status post right small finger dorsal hand I&D of a deep abscess      with noted cultures positive for Methicillin-resistant      Staphylococcus aureus.  2. History of alcoholism.  3. History of narcotic abuse.  4. History of depression.   CONDITION ON DISCHARGE:  Improved.   DIET:  Regular.   ACTIVITIES:  1. She will keep the wound clean and dry.  2. Elevate.  3. Change her dressing daily.  4. Wet-to-dry as instructed at bedside.  5. Encouraged to move her fingers frequently.   DISCHARGE MEDICATIONS:  Include:  1. Percocet 5/325 one to two p.o. every four to six hours p.r.n. pain.  2. Robaxin 500 mg 1 p.o. every six hours p.r.n. spasm.  3. Doxycycline 100 mg 1 p.o. b.i.d. x14 days.  4. Mupirocin ointment 0.5 g in each nostril b.i.d. x5 days.   PLAN:  She will follow up with Dr. Amanda Pea Monday, February 25, 2006.  Call [phone] for an appointment or questions or concerns.      Karie Chimera, P.A.-C.     BB/MEDQ  D:  03/28/2006  T:  03/29/2006  Job:  [id number]

## 2010-08-25 NOTE — Op Note (Signed)
[facility]  Patient:    Stephanie Gregory, Stephanie Gregory                       MRN: [id number] Proc. Date: 12/22/99 Adm. Date:  [id number] Attending:  Danella Penton                           Operative Report  PREOPERATIVE DIAGNOSIS:  Chronic C5 radiculopathy secondary to C4-C5 herniated disk ______ right C6-7 right herniated disk.  POSTOPERATIVE DIAGNOSIS: Chronic C5 radiculopathy secondary to C4-C5 herniated disk ______ right C6-7 right herniated disk.  PROCEDURE:  Anterior C4-C5 diskectomy, foraminotomy, decompression of both C5 nerve roots, 7 mm graft, Synthes plate, microscope, Midas Rex.  SURGEON:  Tanya Nones. Jeral Fruit, M.D.  ASSISTANT:  Cristi Loron, M.D.  CLINICAL HISTORY:  The patient has been seen by me for almost four months complaining of history of neck and left upper extremity pain.  Patient has failed conservative treatment.  MRI twice showed herniated disk of the L4-5 left and then ______ C6-C7.  Patient wanted to go ahead with surgery because she was not doing any better despite multiple conservative treatments. Patient knew of the risks such as need for further surgery, infection, CSF leak, damage to vocal cord, damage to the esophagus.  PROCEDURE:  The patient was taken to the OR and the left side of the neck was prepped with Betadine.  Transverse incision was made through the skin, down to the cervical spine.  X-ray showed that we were at the level 5-6.  From then on, we identified the C4-C5 space.  The anterior ligament was opened.  We brought the microscope into the area and we did a total gross diskectomy.  To the left side, we found two fragments of disk with spondylosis.  Decompression bilaterally was done.  Patient had a large epidural vein and hemostasis was done with Gelfoam.  Having good decompression, a hook was introduced into the foramen bilaterally and it was wide open.  From then on, the end plates were drilled with  the Midas Rex and a 7 mm graft was inserted.  A Synthes plate using four screws was done.  Lateral cervical spine showed good position of the graft and the plate.  From then on, the area was irrigated and closed with Vicryl and Steri-Strips. DD:  12/22/99 TD:  12/23/99 Job: [id number]

## 2010-08-25 NOTE — H&P (Signed)
[facility]  Patient:    Stephanie Gregory, Stephanie Gregory                       MRN: [id number] Adm. Date:  [id number] Attending:  Danella Penton                         History and Physical  HISTORY OF PRESENT ILLNESS:  This patient is a lady who had been seen in my office since May 2001 because of two-month history of sudden onset of neck pain down to the left shoulder, which is getting progressively worse.  This lady has failed conservative treatment, including epidural injection.  She had been taking so much pain medication that we were highly concerned about a possibility of addiction.  Nevertheless, she continued to have the persistent pain, with good days and bad days, but lately is not any better.  She denies any pain in the right upper extremity.  She denies any problem with bladder or bowel.  She had two MRIs, and because of the clinical progression, she wants to have her surgery.  PAST MEDICAL HISTORY:  Sinus surgery.  MEDICATIONS:  She takes some medication for depression and some other for headache.  SOCIAL HISTORY:  She smokes a pack, and she has been doing so since she was 45 years old.  Patient does not drink.  She is 5 feet 3 inches and she is 89.  FAMILY HISTORY:  Negative.  REVIEW OF SYSTEMS:  Positive for sinus headache, abdominal pain, history of ulcer, and gastritis.  PHYSICAL EXAMINATION:  GENERAL:  Patient came to my office yesterday, and she was having quite a bit of pain down to the left shoulder.  HEENT:  Normal.  NECK:  She was able to flex, but extension and lateralization produces pain going to the left shoulder.  LUNGS:  Rhonchi bilaterally.  CARDIAC:  Heart sounds normal.  ABDOMEN:  Normal.  EXTREMITIES:  Normal pulses.  NEUROLOGIC:  Mental status normal.  Cranial nerve normal.  Strength is normal except in the left upper extremity, where I can break the left deltoid.  There is some mild weakness of the left  biceps.  Triceps is normal.  Hand grip is normal.  Reflexes symmetrical with decrease of the left biceps.  Coordination and gait normal.  Sensation seems to be normal.  LABORATORY DATA:  The MRI which was repeated twice shows a herniated disk central and to the left at the level 4-5, mostly going to the foramen.  There is an incidental disk at the level right C6-C7.  CLINICAL IMPRESSION: 1. Left C5 radiculopathy, chronic, secondary to C4-C5 herniated disk. 2. Incidental right C6-C7 herniated disk.  RECOMMENDATION:  The patient is being admitted for surgery.  The procedure will be anterior cervical diskectomy, using bone ______ and a plate.  She knows about the risks, such as no improvement of the pain, paralysis, damage to vocal cord, damage to esophagus, damage to the trachea, stroke, need of further surgery.  Also, she knows that this problem will not take care of the incidental right C6-C7.  Patient was given the choice of a second opinion, but she declined. DD:  12/22/99 TD:  12/22/99 Job: [id number]

## 2010-08-25 NOTE — H&P (Signed)
NAMEVERNEAL, WIERS NO.:  [id number]   MEDICAL RECORD NO.:  [id number]          PATIENT TYPE:  INP   LOCATION:  1831                         FACILITY:  [facility]   PHYSICIAN:  Lonia Blood, M.D.       DATE OF BIRTH:  1965-07-14   DATE OF ADMISSION:  07/26/2005  DATE OF DISCHARGE:                                HISTORY & PHYSICAL   PRIMARY CARE PHYSICIAN:  Unassigned.   HISTORY OF PRESENT ILLNESS:  [name] is a 45 year old woman with history  of depression and suicide attempts,  who was brought into the emergency room  by the police after she had an altercation with her landlord. Apparently  earlier in the day the patient took an unspecified amount of tablets and as  she was being evaluated in the emergency room she became less and less  responsive. The patient's boyfriend has related the fact that the patient  has threatened everybody that she will kill herself for the past days, so he  was concerned enough as to sign commitment papers on the Stephanie Gregory. Ms.  Stephanie Gregory apparently took a nonspecified amount of Xanax, maybe up to 7  tablets, as well as some street drugs that we do not know the name or  quantity of. The patient's medications were brought in a bag and they are  Neurontin, trazodone, and Effexor, but the bottles seem to have an adequate  amount of pills inside them. Currently the patient is unresponsive, unable  to provide any history.   PAST MEDICAL HISTORY:  Per the old records include depression with suicidal  tendencies and cervical spine surgery.   SOCIAL HISTORY:  The patient lives with boyfriend. Apparently there is a  history of tobacco use, but no alcohol abuse.   REVIEW OF SYSTEMS:  Unobtainable.   ALLERGIES:  Per the old records. No known drug allergies.   MEDICATIONS AT HOME:  1.  Trazodone 100 mg daily.  2.  Effexor 75 mg daily.  3.  Neurontin 300 mg t.i.d.   PHYSICAL EXAMINATION:  GENERAL: Upon admission, presenting with this  comatose patient that does not appear in any respiratory distress. Currently  she is on 100% facemask, breathing about 15 times a minute. She is extremely  difficult to arouse and she cannot even open her eyes spontaneously and does  not follow commands.  HEENT:  Upon forcefully opening the eyes, they are both in midline, they are  not deviated. The pupils are equal and round. They are about 7 mm and  reactive bilaterally to light. The patient's head appears normocephalic and  atraumatic.  LUNGS: Clear to auscultation bilaterally without rhonchi, wheezes, or  crackles.  HEART: Regular rate and rhythm without murmurs, rubs, or gallops.  ABDOMEN: Soft, does not appear to have any rigidity.  EXTREMITIES: No edema.  NEUROLOGIC: Unobtainable.   LABORATORY VALUES:  Sodium 137, potassium 3.7, chloride 105, BUN 7. PCO2 in  the venous blood is 53, bicarbonate 26, creatinine 1.2, and urine pregnancy  is negative. Urine drug screen is positive for opiates and benzodiazepines  as well  as acetaminophen at a level of 11. Salicylate is less than 4,  alcohol is less than 5.   ASSESSMENT/PLAN:  1.  Acute respiratory failure secondary to medication overdose. For the      moment Stephanie Gregory is able to sustain her oxygenation and she seems to      have intact respiratory rate. She has received numerous doses of Narcan,      but she has not received any flumazenil. At this point in time my plan      is to admit the patient to the intensive care unit and give her one dose      of flumazenil, repeat an arterial blood gas to assure that she does not      have extremely elevated levels of PCO2. If I do find that this patient      has severe respiratory acidosis I will proceed straight ahead with      orotracheal intubation and mechanical ventilation until she clears her      overdose. I will also obtain another acetaminophen level and urine drug      screen will be done for tricyclics. Of note, the patient's  EKG does not      show any QT prolongation.  2.  Depression. [name] definitely will need a psychiatric evaluation      after this event and she most likely will require some inpatient      psychiatric treatment.      Lonia Blood, M.D.  Electronically Signed     SL/MEDQ  D:  07/26/2005  T:  07/26/2005  Job:  [id number]

## 2010-08-25 NOTE — Op Note (Signed)
[facility]  Patient:    Stephanie Gregory                         MRN: [id number] Proc. Date: 10/31/00 Attending:  Miguel Aschoff, M.D.                           Operative Report  PREOPERATIVE DIAGNOSES:       1. Desired sterilization.                               2. Elective termination of pregnancy.  POSTOPERATIVE DIAGNOSES:      1. Desired sterilization.                               2. Elective termination of pregnancy.  PROCEDURE:                    1. Laparoscopy with tubal sterilization using cautery division.                               2. Suction curettage.  SURGEON:                      Miguel Aschoff, M.D.  ANESTHESIA:                   General.  COMPLICATIONS:                None.  JUSTIFICATION:                The patient is a 45 year old white female approximately six to seven weeks gestation who desires an elective termination of pregnancy.  In addition, she desires permanent sterilization. The risks and benefits of the procedure have been discussed with the patient. Informed consent has been obtained.  She presents now to undergo suction curettage and laparoscopic tubal sterilization.  DESCRIPTION OF PROCEDURE:     The patient was taken to the operating room and placed in a supine position and general anesthesia was administered without difficulty.  She was then placed in the dorsal lithotomy position, prepped and draped in the usual sterile fashion.  The bladder were catheterized.  A speculum was placed in the vaginal vault.  The anterior cervical lip was grasped by the tenaculum and then using serial Pratt dilators, the endocervical canal was dilated until a #25 Pratt dilator could be passed. Then using a #8 suction curette, the contents of the uterus were evacuated without difficulty.  Following the suction curettage, sharp curettage was carried out with only a small amount of additional tissue being obtained and then a final pass  was made with the suction curette.  Once the cavity was felt to be empty, the single-tooth tenaculum was removed and a Hulka tenaculum was applied.  At this point, attention was directed to the umbilicus where a small infraumbilical incision was made.  A Verres needle was then inserted and the abdomen was insufflated with three liters of CO2.  Following the insufflation, a trocar ______ was placed followed by the laparoscope itself.  Then under direct visualization, a 5-mm suprapubic port was established.  The uterus appeared to be normal size and shape at this point.  The anterior bladder  appeared to ______ as unremarkable.  The round ligaments were unremarkable with a 2- to 3-mm ______ .  The fimbria were normal bilaterally.  ______ were also noted to be within normal limits.  No adhesions were noted.  The cul-de-sac was inspected and was unremarkable.  Intestinal surfaces appeared to be within normal limits as was the liver.  At this point, bipolar cautery forceps were introduced and a portion of each tube was identified and cauterized for approximately 3 cm in the midline.  After the cauterization, scissors were used to divide the tubes.  This was done with good hemostasis and good separation.  At this point with no other abnormalities being noted, the procedure was completed.  The CO2 was allowed to escape.  All instruments were removed and then the small incisions were closed using a subcuticular 4-0 Vicryl.  The estimated blood loss for both procedures was less than 50 cc. The patient tolerated the procedure well.  The patient is to be discharged home.  Medications for home include Toradol 10 mg every four hours as needed for pain, doxycycline 100 mg twice a day x three days.  The patient is to call if there are any problems such as fever, pain, or heavy bleeding.  The patients blood type is O positive.  She will be seen back in four weeks for a follow-up examination and was  instructed to place nothing in the vagina for two weeks. DD:  10/31/00 TD:  10/31/00 Job: [id number]

## 2011-02-26 ENCOUNTER — Other Ambulatory Visit (HOSPITAL_COMMUNITY): Payer: Self-pay | Admitting: Family Medicine

## 2011-02-26 DIAGNOSIS — R140 Abdominal distension (gaseous): Secondary | ICD-10-CM

## 2011-02-26 DIAGNOSIS — J3489 Other specified disorders of nose and nasal sinuses: Secondary | ICD-10-CM

## 2011-03-02 ENCOUNTER — Inpatient Hospital Stay (HOSPITAL_COMMUNITY): Admission: RE | Admit: 2011-03-02 | Payer: Self-pay | Source: Ambulatory Visit

## 2011-03-05 ENCOUNTER — Ambulatory Visit (HOSPITAL_COMMUNITY)
Admission: RE | Admit: 2011-03-05 | Discharge: 2011-03-05 | Disposition: A | Discharge status: Home / Self Care | Payer: Self-pay | Source: Ambulatory Visit | Attending: Family Medicine | Admitting: Family Medicine

## 2011-03-05 ENCOUNTER — Other Ambulatory Visit (HOSPITAL_COMMUNITY): Payer: Self-pay | Admitting: Family Medicine

## 2011-03-05 DIAGNOSIS — R109 Unspecified abdominal pain: Secondary | ICD-10-CM | POA: Insufficient documentation

## 2011-03-05 DIAGNOSIS — N83209 Unspecified ovarian cyst, unspecified side: Secondary | ICD-10-CM | POA: Insufficient documentation

## 2011-03-05 DIAGNOSIS — R079 Chest pain, unspecified: Secondary | ICD-10-CM | POA: Insufficient documentation

## 2011-03-05 DIAGNOSIS — R141 Gas pain: Secondary | ICD-10-CM | POA: Insufficient documentation

## 2011-03-05 DIAGNOSIS — R140 Abdominal distension (gaseous): Secondary | ICD-10-CM

## 2011-03-05 DIAGNOSIS — R52 Pain, unspecified: Secondary | ICD-10-CM

## 2011-03-05 DIAGNOSIS — R142 Eructation: Secondary | ICD-10-CM | POA: Insufficient documentation

## 2011-03-05 DIAGNOSIS — R143 Flatulence: Secondary | ICD-10-CM | POA: Insufficient documentation

## 2011-03-06 ENCOUNTER — Ambulatory Visit (HOSPITAL_COMMUNITY)
Admission: RE | Admit: 2011-03-06 | Discharge: 2011-03-06 | Disposition: A | Discharge status: Home / Self Care | Payer: Self-pay | Source: Ambulatory Visit | Attending: Family Medicine | Admitting: Family Medicine

## 2011-03-06 ENCOUNTER — Other Ambulatory Visit (HOSPITAL_COMMUNITY): Payer: Self-pay | Admitting: Family Medicine

## 2011-03-06 DIAGNOSIS — J3489 Other specified disorders of nose and nasal sinuses: Secondary | ICD-10-CM

## 2011-03-06 DIAGNOSIS — R51 Headache: Secondary | ICD-10-CM | POA: Insufficient documentation

## 2011-03-07 ENCOUNTER — Other Ambulatory Visit (HOSPITAL_COMMUNITY): Payer: Self-pay

## 2011-06-02 ENCOUNTER — Emergency Department (HOSPITAL_BASED_OUTPATIENT_CLINIC_OR_DEPARTMENT_OTHER)
Admission: EM | Admit: 2011-06-02 | Discharge: 2011-06-02 | Disposition: A | Discharge status: Home / Self Care | Payer: Self-pay | Attending: Emergency Medicine | Admitting: Emergency Medicine

## 2011-06-02 ENCOUNTER — Encounter (HOSPITAL_BASED_OUTPATIENT_CLINIC_OR_DEPARTMENT_OTHER): Payer: Self-pay | Admitting: Emergency Medicine

## 2011-06-02 DIAGNOSIS — K219 Gastro-esophageal reflux disease without esophagitis: Secondary | ICD-10-CM | POA: Insufficient documentation

## 2011-06-02 DIAGNOSIS — F419 Anxiety disorder, unspecified: Secondary | ICD-10-CM

## 2011-06-02 DIAGNOSIS — F411 Generalized anxiety disorder: Secondary | ICD-10-CM | POA: Insufficient documentation

## 2011-06-02 HISTORY — DX: Reserved for concepts with insufficient information to code with codable children: IMO0002

## 2011-06-02 HISTORY — DX: Anxiety disorder, unspecified: F41.9

## 2011-06-02 HISTORY — DX: Gastro-esophageal reflux disease without esophagitis: K21.9

## 2011-06-02 MED ORDER — LORAZEPAM 1 MG PO TABS
1.0000 mg | ORAL_TABLET | Freq: Once | ORAL | Status: AC
  Administered 2011-06-02: 1 mg via ORAL
  Filled 2011-06-02: qty 1

## 2011-06-02 MED ORDER — CITALOPRAM HYDROBROMIDE 10 MG PO TABS: 10.0000 mg | ORAL_TABLET | Freq: Every day | ORAL | Status: DC

## 2011-06-02 MED ORDER — ALPRAZOLAM 1 MG PO TABS: 1.0000 mg | ORAL_TABLET | Freq: Every evening | ORAL | Status: DC | PRN

## 2011-06-02 NOTE — ED Provider Notes (Signed)
History     CSN: [id number]  Arrival date & time 06/02/11  1057   First MD Initiated Contact with Patient 06/02/11 1137      Chief Complaint  Patient presents with  . Anxiety    (Consider location/radiation/quality/duration/timing/severity/associated sxs/prior treatment) HPI Comments: Patient presents with symptoms of anxiety and panic attack.  Patient notes that she watched her husband she himself in the head approximately 2 years ago and since that time she's had increasing panic attacks and anxiety.  Her roommates son also committed suicide several months ago and that has also been an increased stressor.  Patient denies any suicidal or homicidal thoughts but while she was driving today had onset of an anxiety attack.  She felt her heart racing and had the sensation that she was dying.  No significant shortness of breath.  Patient does appear anxious in the room.  She is currently not taking any anti-anxiety medications but previously had been on Xanax.  Patient is attempting to try and followup with [facility] in with a new primary care physician but states that they're telling her they are not taking new patients until April and May.  Patient is a 46 y.o. female presenting with anxiety. The history is provided by the patient. No language interpreter was used.  Anxiety This is a recurrent problem. The current episode started less than 1 hour ago. The problem occurs every several days. The problem has been gradually worsening. Pertinent negatives include no chest pain, no abdominal pain, no headaches and no shortness of breath.    Past Medical History  Diagnosis Date  . Anxiety   . GERD (gastroesophageal reflux disease)   . Migraine   . DDD (degenerative disc disease)     Past Surgical History  Procedure Date  . Neck surgery     History reviewed. No pertinent family history.  History  Substance Use Topics  . Smoking status: Current Everyday Smoker  . Smokeless tobacco: Not on  file  . Alcohol Use: No    OB History    Grav Para Term Preterm Abortions TAB SAB Ect Mult Living                  Review of Systems  Constitutional: Negative.  Negative for fever and chills.  HENT: Negative.   Eyes: Negative.  Negative for discharge and redness.  Respiratory: Negative.  Negative for cough and shortness of breath.   Cardiovascular: Negative.  Negative for chest pain.  Gastrointestinal: Negative.  Negative for nausea, vomiting, abdominal pain and diarrhea.  Genitourinary: Negative.  Negative for dysuria and vaginal discharge.  Musculoskeletal: Negative.  Negative for back pain.  Skin: Negative.  Negative for color change and rash.  Neurological: Negative.  Negative for syncope and headaches.  Hematological: Negative.  Negative for adenopathy.  Psychiatric/Behavioral: Negative for suicidal ideas and confusion. The patient is nervous/anxious.   All other systems reviewed and are negative.    Allergies  Review of patient's allergies indicates no known allergies.  Home Medications   Current Outpatient Rx  Name Route Sig Dispense Refill  . NEURONTIN PO Oral Take by mouth.    Marland Kitchen PRILOSEC PO Oral Take by mouth.    . TOPAMAX PO Oral Take by mouth.    . TRAZODONE HCL PO Oral Take by mouth.    . ALPRAZOLAM 1 MG PO TABS Oral Take 1 tablet (1 mg total) by mouth at bedtime as needed for sleep. 20 tablet 0  . CITALOPRAM HYDROBROMIDE 10  MG PO TABS Oral Take 1 tablet (10 mg total) by mouth daily. 30 tablet 2    BP 138/81  Pulse 87  Temp(Src) 97.9 F (36.6 C) (Oral)  Resp 20  SpO2 100%  Physical Exam  Nursing note and vitals reviewed. Constitutional: She is oriented to person, place, and time. She appears well-developed and well-nourished.  Non-toxic appearance. She does not have a sickly appearance.  HENT:  Head: Normocephalic and atraumatic.  Eyes: Conjunctivae, EOM and lids are normal. Pupils are equal, round, and reactive to light. No scleral icterus.  Neck:  Trachea normal and normal range of motion. Neck supple.  Cardiovascular: Normal rate, regular rhythm and normal heart sounds.   Pulmonary/Chest: Effort normal and breath sounds normal.  Abdominal: Soft. Normal appearance. There is no tenderness. There is no rebound, no guarding and no CVA tenderness.  Musculoskeletal: Normal range of motion.  Neurological: She is alert and oriented to person, place, and time. She has normal strength.  Skin: Skin is warm, dry and intact. No rash noted.  Psychiatric: Her behavior is normal. Judgment and thought content normal.       Patient appears anxious but otherwise the appropriate    ED Course  Procedures (including critical care time)  Labs Reviewed - No data to display No results found.   No diagnosis found.    MDM  Patient with likely generalized anxiety disorder and acute panic attack today.  I had a discussion about starting the patient on Celexa and she states she's been on that as well as Paxil and other SSRIs and they have not worked for her in the past.  Therefore the patient would prefer not to start on Celexa at this time.  I will write her a small prescription of Xanax to assist her with when necessary symptoms and I have urged the patient to continue with attempting to followup with [facility] in a new primary care physician which she is arranging for April and May when those places are accepting new patients.  Patient is now much calmer after having received her initial dose of Ativan as well.  She is not showing any signs of suicidal or homicidal thoughts at this time.        Nat Christen, MD 06/02/11 [phone]

## 2011-06-02 NOTE — ED Notes (Signed)
Pt reports having panic attacks x 2 days (no precipitating event, but anxiety problems began 2 yrs ago when her husband committed suicide in front of her)

## 2011-06-02 NOTE — Discharge Instructions (Signed)

## 2011-06-03 ENCOUNTER — Encounter (HOSPITAL_COMMUNITY): Payer: Self-pay | Admitting: *Deleted

## 2011-06-03 ENCOUNTER — Emergency Department (HOSPITAL_COMMUNITY)
Admission: EM | Admit: 2011-06-03 | Discharge: 2011-06-04 | Disposition: A | Discharge status: Other Acute Inpatient Hospital | Payer: Self-pay | Attending: Surgery | Admitting: Surgery

## 2011-06-03 DIAGNOSIS — T50902A Poisoning by unspecified drugs, medicaments and biological substances, intentional self-harm, initial encounter: Secondary | ICD-10-CM | POA: Insufficient documentation

## 2011-06-03 DIAGNOSIS — R45851 Suicidal ideations: Secondary | ICD-10-CM | POA: Insufficient documentation

## 2011-06-03 DIAGNOSIS — T50901A Poisoning by unspecified drugs, medicaments and biological substances, accidental (unintentional), initial encounter: Secondary | ICD-10-CM | POA: Insufficient documentation

## 2011-06-03 DIAGNOSIS — R4789 Other speech disturbances: Secondary | ICD-10-CM | POA: Insufficient documentation

## 2011-06-03 LAB — CBC
HCT: 38.6 % (ref 36.0–46.0)
MCH: 29.9 pg (ref 26.0–34.0)
MCV: 86.7 fL (ref 78.0–100.0)
Platelets: 242 10*3/uL (ref 150–400)
RDW: 13.4 % (ref 11.5–15.5)
WBC: 19.4 10*3/uL — ABNORMAL HIGH (ref 4.0–10.5)

## 2011-06-03 LAB — BASIC METABOLIC PANEL
BUN: 9 mg/dL (ref 6–23)
CO2: 23 mEq/L (ref 19–32)
Calcium: 8.5 mg/dL (ref 8.4–10.5)
Chloride: 104 mEq/L (ref 96–112)
Creatinine, Ser: 0.86 mg/dL (ref 0.50–1.10)
GFR calc Af Amer: >90 mL/min (ref >90)

## 2011-06-03 NOTE — ED Notes (Addendum)
Patient c/o sudden onset anxiety attack at approx 1130 this AM. Patient reports hx of husband and more recently friend committing suicide. Patient states she was helping clean out her friend's house and just started to feel overwhelmed. Denies SI/HI currently, but reports thinking about sometimes. Reports having her adult children around as her support system. Currently, patient c/o palpitations. Respirations even and unlabored and patient has eyes closed and resting by the end of assessment.

## 2011-06-03 NOTE — ED Notes (Addendum)
Pt in c/o anxiety, states she has a history of anxiety and started to have a panic attack while driving today, pt denies having medication to take for anxiety, computer show pt was written for xanax yesterday, pt confused when asked about medication, pt speech is slurred, denies taking medication

## 2011-06-03 NOTE — ED Provider Notes (Signed)
History     CSN: [id number]  Arrival date & time 06/03/11  2023   First MD Initiated Contact with Patient 06/03/11 2205      Chief Complaint  Patient presents with  . Anxiety    (Consider location/radiation/quality/duration/timing/severity/associated sxs/prior treatment) The history is limited by the absence of a caregiver.    Pt to ED with complaints of panic attack. Pt was seen yesterday and prescribed Xanax. Today she presents complaining of anxiety but appears to be oversedated. She denies any other medications and says that she has only take 2 mg Xanax and also "maybe took her Trazodone". Pt is arrousible with verbal stimuli but falls asleep mid conversation. It does not appear that patient was given a psych consult at her last visit. Pt does express feelings of feeling hopeless and recurrent anxiety as she expresses not having a steady place to stay. Does admit to some suicidal thoughts but denies plan. Denies being homicidal.   Past Medical History  Diagnosis Date  . Anxiety   . GERD (gastroesophageal reflux disease)   . Migraine   . DDD (degenerative disc disease)     Past Surgical History  Procedure Date  . Neck surgery     History reviewed. No pertinent family history.  History  Substance Use Topics  . Smoking status: Current Everyday Smoker  . Smokeless tobacco: Not on file  . Alcohol Use: No    OB History    Grav Para Term Preterm Abortions TAB SAB Ect Mult Living                  Review of Systems  Unable to perform ROS   Allergies  Review of patient's allergies indicates no known allergies.  Home Medications   Current Outpatient Rx  Name Route Sig Dispense Refill  . ALPRAZOLAM 1 MG PO TABS Oral Take 1 tablet (1 mg total) by mouth at bedtime as needed for sleep. 20 tablet 0  . NEURONTIN PO Oral Take by mouth.    Marland Kitchen PRILOSEC PO Oral Take by mouth.    . TOPAMAX PO Oral Take by mouth.    . TRAZODONE HCL PO Oral Take by mouth.      BP 107/66   Pulse 87  Temp(Src) 98.5 F (36.9 C) (Oral)  Resp 14  SpO2 96%  Physical Exam  Nursing note and vitals reviewed. Constitutional: She appears well-developed and well-nourished. No distress.  HENT:  Head: Normocephalic and atraumatic.  Eyes: Pupils are equal, round, and reactive to light.  Neck: Trachea normal, normal range of motion and full passive range of motion without pain. Neck supple.  Cardiovascular: Normal rate, regular rhythm and normal pulses.   Pulmonary/Chest: Effort normal and breath sounds normal. No apnea. No respiratory distress. Chest wall is not dull to percussion. She exhibits no crepitus, no edema, no deformity and no retraction.  Abdominal: Soft. Normal appearance.  Musculoskeletal: Normal range of motion.  Neurological: She is alert. She has normal strength.  Skin: Skin is warm, dry and intact. She is not diaphoretic.  Psychiatric: Her speech is slurred (pt appears sedated). Cognition and memory are normal.    ED Course  Procedures (including critical care time)  Labs Reviewed  CBC - Abnormal; Notable for the following:    WBC 19.4 (*)    All other components within normal limits  BASIC METABOLIC PANEL - Abnormal; Notable for the following:    Potassium 3.2 (*)    GFR calc non Af Amer 82 (*)  All other components within normal limits  URINE RAPID DRUG SCREEN (HOSP PERFORMED) - Abnormal; Notable for the following:    Benzodiazepines POSITIVE (*)    All other components within normal limits  ETHANOL   No results found.   1. Overdose       MDM  Pt is currently acting sedated but is easily arrousible to verbal stimuli, appropriate thought content however she dozes off during sentences.   I have spoken with Idalia Needle from [facility] who has agreed to consult on patient once patient less sedated.        Dorthula Matas, PA 06/04/11 0102  Medical screening examination/treatment/procedure(s) were conducted as a shared visit with non-physician  practitioner(s) and myself.  I personally evaluated the patient during the encounter. 2 AM patient is awake and complains of difficulty sleeping.  [facility] team evaluated and plan admit for suicidal ideation, questionable overdose Xanax. She is medically cleared for psych disposition. She is voluntary at this time.   Sunnie Nielsen, MD 06/04/11 [phone]

## 2011-06-04 ENCOUNTER — Inpatient Hospital Stay (HOSPITAL_COMMUNITY)
Admission: AD | Admit: 2011-06-04 | Discharge: 2011-06-07 | DRG: 897 | Disposition: A | Discharge status: Home / Self Care | Payer: PRIVATE HEALTH INSURANCE | Source: Ambulatory Visit | Attending: Psychiatry | Admitting: Psychiatry

## 2011-06-04 ENCOUNTER — Encounter (HOSPITAL_COMMUNITY): Payer: Self-pay | Admitting: Emergency Medicine

## 2011-06-04 ENCOUNTER — Encounter (HOSPITAL_COMMUNITY): Payer: Self-pay | Admitting: *Deleted

## 2011-06-04 DIAGNOSIS — F1320 Sedative, hypnotic or anxiolytic dependence, uncomplicated: Principal | ICD-10-CM

## 2011-06-04 DIAGNOSIS — F1310 Sedative, hypnotic or anxiolytic abuse, uncomplicated: Secondary | ICD-10-CM

## 2011-06-04 DIAGNOSIS — K219 Gastro-esophageal reflux disease without esophagitis: Secondary | ICD-10-CM

## 2011-06-04 DIAGNOSIS — F329 Major depressive disorder, single episode, unspecified: Secondary | ICD-10-CM

## 2011-06-04 DIAGNOSIS — F411 Generalized anxiety disorder: Secondary | ICD-10-CM

## 2011-06-04 DIAGNOSIS — G43909 Migraine, unspecified, not intractable, without status migrainosus: Secondary | ICD-10-CM

## 2011-06-04 DIAGNOSIS — IMO0002 Reserved for concepts with insufficient information to code with codable children: Secondary | ICD-10-CM

## 2011-06-04 DIAGNOSIS — F17200 Nicotine dependence, unspecified, uncomplicated: Secondary | ICD-10-CM

## 2011-06-04 DIAGNOSIS — Z79899 Other long term (current) drug therapy: Secondary | ICD-10-CM

## 2011-06-04 LAB — RAPID URINE DRUG SCREEN, HOSP PERFORMED
Amphetamines: NOT DETECTED
Opiates: NOT DETECTED
Tetrahydrocannabinol: NOT DETECTED

## 2011-06-04 MED ORDER — ZOLPIDEM TARTRATE 5 MG PO TABS: 5.0000 mg | ORAL_TABLET | Freq: Every evening | ORAL | Status: DC | PRN

## 2011-06-04 MED ORDER — ALUM & MAG HYDROXIDE-SIMETH 200-200-20 MG/5ML PO SUSP: 30.0000 mL | ORAL | Status: DC | PRN

## 2011-06-04 MED ORDER — ACETAMINOPHEN 325 MG PO TABS
650.0000 mg | ORAL_TABLET | Freq: Once | ORAL | Status: AC
  Administered 2011-06-04: 650 mg via ORAL
  Filled 2011-06-04: qty 2

## 2011-06-04 MED ORDER — ACETAMINOPHEN 325 MG PO TABS
650.0000 mg | ORAL_TABLET | ORAL | Status: DC | PRN
  Filled 2011-06-04: qty 1

## 2011-06-04 MED ORDER — SODIUM CHLORIDE 0.9 % IV BOLUS (SEPSIS)
500.0000 mL | Freq: Once | INTRAVENOUS | Status: AC
  Administered 2011-06-04: 500 mL via INTRAVENOUS

## 2011-06-04 MED ORDER — HYDROXYZINE HCL 25 MG PO TABS
25.0000 mg | ORAL_TABLET | Freq: Four times a day (QID) | ORAL | Status: DC | PRN
  Administered 2011-06-05 – 2011-06-06 (×3): 25 mg via ORAL

## 2011-06-04 MED ORDER — NICOTINE 21 MG/24HR TD PT24
21.0000 mg | MEDICATED_PATCH | Freq: Every day | TRANSDERMAL | Status: DC
  Administered 2011-06-04: 21 mg via TRANSDERMAL
  Filled 2011-06-04: qty 1

## 2011-06-04 MED ORDER — HYDROXYZINE PAMOATE 25 MG PO CAPS: 25.0000 mg | ORAL_CAPSULE | Freq: Every evening | ORAL | Status: DC | PRN

## 2011-06-04 MED ORDER — PANTOPRAZOLE SODIUM 40 MG PO TBEC
40.0000 mg | DELAYED_RELEASE_TABLET | Freq: Every day | ORAL | Status: DC
  Administered 2011-06-04: 40 mg via ORAL
  Filled 2011-06-04: qty 1

## 2011-06-04 MED ORDER — ONDANSETRON HCL 4 MG PO TABS: 4.0000 mg | ORAL_TABLET | Freq: Three times a day (TID) | ORAL | Status: DC | PRN

## 2011-06-04 MED ORDER — TOPIRAMATE 25 MG PO TABS
25.0000 mg | ORAL_TABLET | Freq: Every day | ORAL | Status: DC
  Administered 2011-06-04: 25 mg via ORAL
  Filled 2011-06-04: qty 1

## 2011-06-04 MED ORDER — ACETAMINOPHEN 325 MG PO TABS
650.0000 mg | ORAL_TABLET | Freq: Four times a day (QID) | ORAL | Status: DC | PRN
  Administered 2011-06-05 (×2): 650 mg via ORAL

## 2011-06-04 MED ORDER — NICOTINE 21 MG/24HR TD PT24
21.0000 mg | MEDICATED_PATCH | Freq: Every day | TRANSDERMAL | Status: DC
  Administered 2011-06-05 – 2011-06-07 (×3): 21 mg via TRANSDERMAL
  Filled 2011-06-04 (×4): qty 1

## 2011-06-04 MED ORDER — IBUPROFEN 600 MG PO TABS
600.0000 mg | ORAL_TABLET | Freq: Three times a day (TID) | ORAL | Status: DC | PRN
  Administered 2011-06-04: 600 mg via ORAL
  Filled 2011-06-04: qty 1
  Filled 2011-06-04: qty 3

## 2011-06-04 MED ORDER — MAGNESIUM HYDROXIDE 400 MG/5ML PO SUSP: 30.0000 mL | Freq: Every day | ORAL | Status: DC | PRN

## 2011-06-04 MED ORDER — TRAZODONE HCL 100 MG PO TABS: 100.0000 mg | ORAL_TABLET | Freq: Every evening | ORAL | Status: DC | PRN
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
## 2011-06-04 NOTE — Tx Team (Signed)
Initial Interdisciplinary Treatment Plan  PATIENT STRENGTHS: (choose at least two) Communication skills General fund of knowledge Motivation for treatment/growth Physical Health Supportive family/friends  PATIENT STRESSORS: Legal issue Substance abuse   PROBLEM LIST: Problem List/Patient Goals Date to be addressed Date deferred Reason deferred Estimated date of resolution  Substance Abuse: benzos 06/04/11     Risk for suicide 06/04/11                                                DISCHARGE CRITERIA:  Ability to meet basic life and health needs Improved stabilization in mood, thinking, and/or behavior Motivation to continue treatment in a less acute level of care Verbal commitment to aftercare and medication compliance Withdrawal symptoms are absent or subacute and managed without 24-hour nursing intervention  PRELIMINARY DISCHARGE PLAN: Attend aftercare/continuing care group Attend 12-step recovery group Outpatient therapy Placement in alternative living arrangements  PATIENT/FAMIILY INVOLVEMENT: This treatment plan has been presented to and reviewed with the patient, Stephanie Gregory.  The patient and family have been given the opportunity to ask questions and make suggestions.  Dyke Brackett 06/04/2011, 10:35 PM

## 2011-06-04 NOTE — ED Notes (Signed)
Pt departs facility with staff and transporting staff en route for admission to [facility]. Pt safe and in stable condition.

## 2011-06-04 NOTE — Progress Notes (Signed)
Patient ID: Stephanie Gregory, female   DOB: 1965/06/26, 46 y.o.   MRN: [id number] Voluntary. Patient requesting detox from benzos, Xanax. Patient reports using off/on for past 2 months. States taking 18 Xanax along with 10 klonopin on Sunday. Does not state this is suicide attempt, states taking this because she was stressed. After taking medications, patient states that she regretted making this decision. Was brought to ED by a friend. Patient was living at [facility] and was told that she needed to get help, detox before she was to come back. Patient plans to discharge to friend's, Stephanie Gregory, home. Patient has history of MRSA in 2009. On skin assessment: blood draw right AC, scar on neck from surgery, scratches on fingers, tattoos: upper middle back, lower back. Patient currently on probation for breaking and entering and credit card fraud 2 years ago, will be off probation on May 1st. Patient denies SI/HI/AV upon admission.  Emergency Contact: Stephanie Gregory (friend) [phone] (cell-main) or [phone]

## 2011-06-04 NOTE — ED Notes (Signed)
Pt has been accepted to [facility] by Lynann Bologna NP to Dr. Koren Shiver. EDP notified and is in agreement with disposition. RN made aware to call report. All support paper work has been completed and faxed to [facility] for review. No further needs identified at this time.

## 2011-06-04 NOTE — ED Notes (Signed)
Report called to RN at [facility]. Pt to be sent at 2100 per instruction of AC. Pt verb understanding, no complaints, no distress.

## 2011-06-04 NOTE — ED Notes (Signed)
Pt now questioning her neurontin & effexor; informed pt will check on it; called pharmacy & have attempted to reach pharm tech, awaiting return call.

## 2011-06-04 NOTE — ED Notes (Signed)
Patient wanded; placed in blue paper pants and scrub top. Patient belongings locked in cabinet in room 23 and one large [employer] bag under nurses' station.

## 2011-06-04 NOTE — ED Notes (Signed)
Pt now @ desk & requesting ibuprofen, informed pt will check if time, pt verbalized understanding.

## 2011-06-04 NOTE — ED Provider Notes (Signed)
Pt resting, easily aroused. No resp dep. No new c/o. Act placement pending.   Suzi Roots, MD 06/04/11 [phone]

## 2011-06-05 DIAGNOSIS — F1310 Sedative, hypnotic or anxiolytic abuse, uncomplicated: Secondary | ICD-10-CM | POA: Diagnosis present

## 2011-06-05 DIAGNOSIS — F1910 Other psychoactive substance abuse, uncomplicated: Secondary | ICD-10-CM

## 2011-06-05 LAB — COMPREHENSIVE METABOLIC PANEL
AST: 8 U/L (ref 0–37)
Albumin: 3.4 g/dL — ABNORMAL LOW (ref 3.5–5.2)
Calcium: 9 mg/dL (ref 8.4–10.5)
Creatinine, Ser: 0.71 mg/dL (ref 0.50–1.10)
Total Protein: 6.9 g/dL (ref 6.0–8.3)

## 2011-06-05 LAB — CBC
MCH: 29.6 pg (ref 26.0–34.0)
MCV: 86.6 fL (ref 78.0–100.0)
Platelets: 232 10*3/uL (ref 150–400)
RDW: 13.6 % (ref 11.5–15.5)

## 2011-06-05 MED ORDER — TOPIRAMATE 25 MG PO TABS
50.0000 mg | ORAL_TABLET | Freq: Every day | ORAL | Status: DC
  Administered 2011-06-05 – 2011-06-07 (×3): 50 mg via ORAL
  Filled 2011-06-05 (×3): qty 2
  Filled 2011-06-05: qty 6
  Filled 2011-06-05: qty 2

## 2011-06-05 MED ORDER — CHLORDIAZEPOXIDE HCL 25 MG PO CAPS: 25.0000 mg | ORAL_CAPSULE | ORAL | Status: DC

## 2011-06-05 MED ORDER — VENLAFAXINE HCL 75 MG PO TABS: 300.0000 mg | ORAL_TABLET | Freq: Every day | ORAL | Status: DC

## 2011-06-05 MED ORDER — VENLAFAXINE HCL ER 150 MG PO CP24
300.0000 mg | ORAL_CAPSULE | Freq: Every day | ORAL | Status: DC
  Administered 2011-06-05 – 2011-06-07 (×3): 300 mg via ORAL
  Filled 2011-06-05 (×5): qty 2
  Filled 2011-06-05: qty 14

## 2011-06-05 MED ORDER — TRAZODONE HCL 100 MG PO TABS
300.0000 mg | ORAL_TABLET | Freq: Every day | ORAL | Status: DC
  Administered 2011-06-05 – 2011-06-06 (×2): 300 mg via ORAL
  Filled 2011-06-05 (×3): qty 3
  Filled 2011-06-05: qty 21

## 2011-06-05 MED ORDER — GABAPENTIN 600 MG PO TABS
300.0000 mg | ORAL_TABLET | Freq: Four times a day (QID) | ORAL | Status: DC
  Administered 2011-06-05 – 2011-06-06 (×5): 300 mg via ORAL
  Filled 2011-06-05 (×12): qty 0.5

## 2011-06-05 MED ORDER — CHLORDIAZEPOXIDE HCL 25 MG PO CAPS
25.0000 mg | ORAL_CAPSULE | Freq: Three times a day (TID) | ORAL | Status: DC
  Administered 2011-06-07 (×2): 25 mg via ORAL
  Filled 2011-06-05 (×2): qty 1

## 2011-06-05 MED ORDER — CHLORDIAZEPOXIDE HCL 25 MG PO CAPS
25.0000 mg | ORAL_CAPSULE | Freq: Four times a day (QID) | ORAL | Status: AC
  Administered 2011-06-05 – 2011-06-06 (×6): 25 mg via ORAL
  Filled 2011-06-05 (×6): qty 1

## 2011-06-05 MED ORDER — ADULT MULTIVITAMIN W/MINERALS CH
1.0000 | ORAL_TABLET | Freq: Every day | ORAL | Status: DC
  Administered 2011-06-05 – 2011-06-07 (×3): 1 via ORAL
  Filled 2011-06-05 (×3): qty 1

## 2011-06-05 MED ORDER — VENLAFAXINE HCL ER 150 MG PO CP24
300.0000 mg | ORAL_CAPSULE | Freq: Every day | ORAL | Status: DC
  Filled 2011-06-05 (×2): qty 2

## 2011-06-05 MED ORDER — CHLORDIAZEPOXIDE HCL 25 MG PO CAPS: 25.0000 mg | ORAL_CAPSULE | Freq: Every day | ORAL | Status: DC

## 2011-06-05 NOTE — Progress Notes (Signed)
Pt states she slept poor last night and needs medication to help her asleep. Appetite is poor, energy level is low, focus is improving. Pt rates her depression as a 9, hopelessness as a 6. Pt c/o pain, nausea and headaches from detox. Pt denies SI/HI. Pt rates her pain at a 9. Pt is working on a goal of staying off drugs. Pt requesting that she be put back on Effexor, neurontin, topamax, pt restarted on those drugs today.
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
## 2011-06-05 NOTE — Progress Notes (Signed)
[facility] Group Notes:  (Counselor/Nursing/MHT/Case Management/Adjunct)  06/05/2011 1:22 PM  Type of Therapy:  Group Therapy  Participation Level:  Active  Participation Quality:  Attentive and Sharing  Affect:  Depressed  Cognitive:  Alert and Oriented  Insight:  Good  Engagement in Group:  Good  Modes of Intervention:  Clarification, Socialization and Support  Summary of Progress/Problems:  Inga shared with group that she was admitted in order to detox from xanax "this time"; patient confirmed that she had issues with multiple addictive substances over the last several years.  Elese also shared that she has felt like she has been in a "lifeless state, void of all energy and light; and I hate it here; enough to accept there is no drug I can safely use"   [name] 06/05/2011, 1:22 PM

## 2011-06-05 NOTE — [facility] Suicide Risk Assessment (Signed)
Suicide Risk Assessment  Admission Assessment     Demographic factors:  See chart.   Current Mental Status:  Patient seen and evaluated. Chart reviewed. Patient stated that her mood was "not good". Her affect was mood congruent and anxious.  Sig w/d s/s noted. She denied any current thoughts of self injurious behavior, suicidal ideation or homicidal ideation. There were no auditory or visual hallucinations, paranoia, delusional thought processes, or mania noted.  Thought process was linear and goal directed.  No psychomotor agitation or retardation was noted. Speech was normal rate, tone and volume. Eye contact was good. Judgment and insight are fair.  Patient has been up and engaged on the unit.  No acute safety concerns reported from team.  Pt denied hx SIB, hx OD several yrs ago per her report.    Loss Factors:  Loss Factors: Legal issues;Decrease in vocational status (on probation)  Historical Factors:  Historical Factors: Family history of mental illness or substance abuse;Impulsivity (dad-etoh); Hx opioid dependence; Hx residential Tx  Risk Reduction Factors:  Risk Reduction Factors: Responsible for children under 18 years of age;Sense of responsibility to family;Living with another person, especially a relative;Positive social support;Positive therapeutic relationship; spirituality  CLINICAL FACTORS: Benzodiazepine Dependence & W/D; Opioid Dependence, in reported remission; Migraines; SIMD  COGNITIVE FEATURES THAT CONTRIBUTE TO RISK: none.  SUICIDE RISK: Pt viewed as a chronic moderate increased risk of harm to self in light of her past hx and risk factors.  No acute safety concerns on the unit.  Pt contracting for safety and in need of crisis stabilization & Tx.  PLAN OF CARE: Pt admitted for crisis stabilization, detox and treatment.  Please see orders.   Medications reviewed with pt and medication education provided.  Will continue q15 minute checks per unit protocol.  No clinical  indication for one on one level of observation at this time.  Pt contracting for safety.  Mental health treatment, medication management and continued sobriety will mitigate against the increased risk of harm to self and/or others.  Discussed the importance of recovery with pt, as well as, tools to move forward in a healthy & safe manner.  Pt agreeable with the plan.  Discussed with the team.   Lupe Carney 06/05/2011, 3:15 PM

## 2011-06-05 NOTE — Progress Notes (Signed)
Patient resting quietly with eyes closed. Respirations even and unlabored. No distress noted. Q 15 minute check continues to maintain safety   

## 2011-06-05 NOTE — Discharge Planning (Signed)
Makaley came to AM group at my insistence,  But was pulled out for H&P.  When I caught up with her this PM, she stated she will likely go stay with a friend in [place] at D/C.  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  Plans to re-commit herself to regular Bible study and church attendance as her sobriety plan.

## 2011-06-05 NOTE — Progress Notes (Signed)
Recreation Therapy Notes  06/05/2011         Time: 1415      Group Topic/Focus: The focus of this group is on discussing various styles of communication and communicating assertively using 'I' (feeling) statements.   Participation Level: Active  Participation Quality: Attentive  Affect: Appropriate  Cognitive: Oriented   Additional Comments: None.   Stephanie Gregory 06/05/2011 3:44 PM

## 2011-06-06 LAB — URINALYSIS, ROUTINE W REFLEX MICROSCOPIC
Glucose, UA: NEGATIVE mg/dL
Hgb urine dipstick: NEGATIVE
Specific Gravity, Urine: 1.009 (ref 1.005–1.030)
Urobilinogen, UA: 0.2 mg/dL (ref 0.0–1.0)

## 2011-06-06 MED ORDER — POTASSIUM CHLORIDE CRYS ER 20 MEQ PO TBCR
20.0000 meq | EXTENDED_RELEASE_TABLET | Freq: Two times a day (BID) | ORAL | Status: DC
  Administered 2011-06-06 – 2011-06-07 (×3): 20 meq via ORAL
  Filled 2011-06-06 (×7): qty 1

## 2011-06-06 MED ORDER — IBUPROFEN 600 MG PO TABS
600.0000 mg | ORAL_TABLET | Freq: Four times a day (QID) | ORAL | Status: DC | PRN
  Administered 2011-06-06 (×2): 600 mg via ORAL
  Filled 2011-06-06 (×2): qty 1

## 2011-06-06 MED ORDER — GABAPENTIN 300 MG PO CAPS
300.0000 mg | ORAL_CAPSULE | Freq: Four times a day (QID) | ORAL | Status: DC
  Administered 2011-06-06 – 2011-06-07 (×4): 300 mg via ORAL
  Filled 2011-06-06: qty 1
  Filled 2011-06-06 (×2): qty 28
  Filled 2011-06-06: qty 1
  Filled 2011-06-06: qty 28
  Filled 2011-06-06 (×2): qty 1
  Filled 2011-06-06: qty 28

## 2011-06-06 NOTE — Progress Notes (Addendum)
[facility] Group Notes:  (Counselor/Nursing/MHT/Case Management/Adjunct)  06/06/2011 3:15 PM  Type of Therapy:  Group Therapy at 11AM  Participation Level:  Minimal  Participation Quality:  Attentive  Affect:  Depressed and Flat  Cognitive:  Oriented  Insight:  Limited  Engagement in Group:  Limited  Engagement in Therapy:  Limited  Modes of Intervention:  Clarification, Socialization and Support  Summary of Progress/Problems: Stephanie Gregory was attentive yet left group room at several points, perhaps in effort to avoid being part of discussion   Stephanie Gregory 06/06/2011, 3:15 PM  [facility] Group Notes:  (Counselor/Nursing/MHT/Case Management/Adjunct)  06/06/2011 3:17 PM  Type of Therapy:  Group Therapy at 1:15  Participation Level:  Minimal  Participation Quality:  Attentive and Sharing  Affect:  Anxious  Cognitive:  Oriented  Insight:  Limited  Engagement in Group:  Limited  Engagement in Therapy:  Limited  Modes of Intervention:  Activity, Socialization and Support  Summary of Progress/Problems:  Patient left group to speak with nurse yet did rejoin to share that she is proud of her children.  Stephanie Gregory participated in group activity on mindfulness yet at one point was so restless she needed to walk around day room during session.    Stephanie Gregory 06/06/2011, 3:17 PM

## 2011-06-06 NOTE — Treatment Plan (Signed)
Interdisciplinary Treatment Plan Update (Adult)  Date: 06/06/2011  Time Reviewed: 10:00 AM   Progress in Treatment: Attending groups: Yes Participating in groups: Yes Taking medication as prescribed: Yes Tolerating medication: Yes   Family/Significant other contact made: Yes by counselor for suicide prevention information  Patient understands diagnosis:  Yes  As evidenced by asking for help with benzo withdrawal Discussing patient identified problems/goals with staff:  Yes  See below Medical problems stabilized or resolved:  Yes Denies suicidal/homicidal ideation: Yes  In tx team Issues/concerns per patient self-inventory:  None noted Other:  New problem(s) identified: N/A  Reason for Continuation of Hospitalization: Medication stabilization Withdrawal symptoms  Interventions implemented related to continuation of hospitalization: Librium taper  Encourage group attendance and participation  Additional comments:  Estimated length of stay: 2-3 days  Discharge Plan:  Likely [place] stay [place] friend in [place] goal(s): N/A  Review of initial/current patient goals per problem list:   1.  Goal(s): safely detox from benzos  Met:  No  Target date:3/1  As evidenced ZO:[id number] in CIWA score from current to 0  2.  Goal (s):Identify comprehensive sobriety plan  Met:  No  Target date:3/1  As evidenced by:So far, [name] is saying she will attend church regularly and possibly NA mtgs.  She will call [employer] today to finalize plans with them  3.  Goal(s):  Met:  No  Target date:  As evidenced by:  4.  Goal(s):  Met:  No  Target date:  As evidenced by:  Attendees: Patient: Stephanie Gregory  06/06/2011 10:00 AM  Family:     Physician:  Lupe Carney 06/06/2011 10:00 AM   Nursing: Carolynn Comment   06/06/2011 10:00 AM   Case Manager:  Richelle Ito, LCSW 06/06/2011 10:00 AM   Counselor:  Ronda Fairly, LCSWA 06/06/2011 10:00 AM   Other:     Other:       Other:     Other:      Scribe for Treatment Team:   Daryel Gerald B, 06/06/2011 10:00 AM

## 2011-06-06 NOTE — Progress Notes (Signed)
Pt up and out in milieu this shift.  Reports good sleep,improving appetite, and normal energy level. Rates depression and hopelessness at 6 but denies SI and contracts for safety.  C/O HA on pt inventory but did not report to RN at am med pass. Support offered for ongoing withdrawal symptoms of chilling,tremors, and cravings.  15' checks cont for safety.

## 2011-06-06 NOTE — Progress Notes (Addendum)
Pt attended morning group. Good participation. Reports better sleep last night. States that she plans to regularly attend church and possibly attend NA meetings. She discussed her ambivalence about attending NA meetings based upon things she has heard about them, but is willing to go and form her own opinion. Pt states that at this time she will not return to the [employer] [facility] as a resident and plans to call the director with the news today. Pt will follow up with [facility] for meds and therapy on March 15th.

## 2011-06-06 NOTE — Progress Notes (Signed)
Patient interacting well with staff and peers. Denied any withdrawal symptoms. Her mood and affect still flat and depressed and seem to be somewhat anxious. Received all her medications without problem. Denied SI/Hi and denied hallucinations. Patient encouraged and supported. Q 15 minute check continues to maintain safety.

## 2011-06-06 NOTE — Progress Notes (Signed)
[facility] MD Progress Note  06/06/2011 10:39 AM  S/O: Patient seen and evaluated in team. Chart reviewed. Patient stated that her mood was "better". Her affect was mood congruent and euthymic. She denied any current thoughts of self injurious behavior, suicidal ideation or homicidal ideation. There were no auditory or visual hallucinations, paranoia, delusional thought processes, or mania noted.  Thought process was linear and goal directed.  No psychomotor agitation or retardation was noted. Speech was normal rate, tone and volume. Eye contact was good. Judgment and insight are fair.  Patient has been up and engaged on the unit.  No acute safety concerns reported from team.  Continued w/d noted, see VS.  Sleep:  Number of Hours: 5.75    Vital Signs:Blood pressure 88/66, pulse 120, temperature 97.6 F (36.4 C), temperature source Oral, resp. rate 16, height 5\' 3"  (1.6 m), weight 47.628 kg (105 lb), last menstrual period 05/14/2011.  Current Medications:    . chlordiazePOXIDE  25 mg Oral QID   Followed by  . chlordiazePOXIDE  25 mg Oral TID   Followed by  . chlordiazePOXIDE  25 mg Oral BH-qamhs   Followed by  . chlordiazePOXIDE  25 mg Oral Daily  . gabapentin  300 mg Oral QID  . mulitivitamin with minerals  1 tablet Oral Daily  . nicotine  21 mg Transdermal Q0600  . potassium chloride  20 mEq Oral BID  . topiramate  50 mg Oral Daily  . traZODone  300 mg Oral QHS  . venlafaxine  300 mg Oral Q breakfast  . DISCONTD: gabapentin  300 mg Oral QID    Lab Results:  Results for orders placed during the hospital encounter of 06/04/11 (from the past 48 hour(s))  CBC     Status: Abnormal   Collection Time   06/05/11  8:00 PM      Component Value Range Comment   WBC 13.9 (*) 4.0 - 10.5 (K/uL)    RBC 4.26  3.87 - 5.11 (MIL/uL)    Hemoglobin 12.6  12.0 - 15.0 (g/dL)    HCT 16.1  09.6 - 04.5 (%)    MCV 86.6  78.0 - 100.0 (fL)    MCH 29.6  26.0 - 34.0 (pg)    MCHC 34.1  30.0 - 36.0 (g/dL)    RDW  40.9  81.1 - 91.4 (%)    Platelets 232  150 - 400 (K/uL)   COMPREHENSIVE METABOLIC PANEL     Status: Abnormal   Collection Time   06/05/11  8:00 PM      Component Value Range Comment   Sodium 136  135 - 145 (mEq/L)    Potassium 2.9 (*) 3.5 - 5.1 (mEq/L)    Chloride 103  96 - 112 (mEq/L)    CO2 23  19 - 32 (mEq/L)    Glucose, Bld 101 (*) 70 - 99 (mg/dL)    BUN 7  6 - 23 (mg/dL)    Creatinine, Ser 7.82  0.50 - 1.10 (mg/dL)    Calcium 9.0  8.4 - 10.5 (mg/dL)    Total Protein 6.9  6.0 - 8.3 (g/dL)    Albumin 3.4 (*) 3.5 - 5.2 (g/dL)    AST 8  0 - 37 (U/L)    ALT 7  0 - 35 (U/L)    Alkaline Phosphatase 78  39 - 117 (U/L)    Total Bilirubin 0.2 (*) 0.3 - 1.2 (mg/dL)    GFR calc non Af Amer >90  >90 (mL/min)  GFR calc Af Amer >90  >90 (mL/min)   URINALYSIS, ROUTINE W REFLEX MICROSCOPIC     Status: Normal   Collection Time   06/05/11  8:49 PM      Component Value Range Comment   Color, Urine YELLOW  YELLOW     APPearance CLEAR  CLEAR     Specific Gravity, Urine 1.009  1.005 - 1.030     pH 6.5  5.0 - 8.0     Glucose, UA NEGATIVE  NEGATIVE (mg/dL)    Hgb urine dipstick NEGATIVE  NEGATIVE     Bilirubin Urine NEGATIVE  NEGATIVE     Ketones, ur NEGATIVE  NEGATIVE (mg/dL)    Protein, ur NEGATIVE  NEGATIVE (mg/dL)    Urobilinogen, UA 0.2  0.0 - 1.0 (mg/dL)    Nitrite NEGATIVE  NEGATIVE     Leukocytes, UA NEGATIVE  NEGATIVE  MICROSCOPIC NOT DONE ON URINES WITH NEGATIVE PROTEIN, BLOOD, LEUKOCYTES, NITRITE, OR GLUCOSE <1000 mg/dL.    Physical Findings: CIWA:  CIWA-Ar Total: 7   A/P: Treatment goals and medication management reviewed with patient.  Continued w/d noted. Hypokalemia.  Continue current treatment plan and medications with addition of Ibuprofen and KDur.  See labs and orders. No SEs reported at current dosages. Pt agreeable with plan.  Discussed with team.   Stephanie Gregory 06/06/2011, 10:39 AM

## 2011-06-07 DIAGNOSIS — F1310 Sedative, hypnotic or anxiolytic abuse, uncomplicated: Secondary | ICD-10-CM

## 2011-06-07 LAB — BASIC METABOLIC PANEL
CO2: 25 mEq/L (ref 19–32)
GFR calc non Af Amer: >90 mL/min (ref >90)
Glucose, Bld: 91 mg/dL (ref 70–99)
Potassium: 3.3 mEq/L — ABNORMAL LOW (ref 3.5–5.1)
Sodium: 142 mEq/L (ref 135–145)

## 2011-06-07 MED ORDER — TOPIRAMATE 25 MG PO TABS: 50.0000 mg | ORAL_TABLET | Freq: Every day | ORAL | Status: DC

## 2011-06-07 MED ORDER — TRAZODONE HCL 100 MG PO TABS: 300.0000 mg | ORAL_TABLET | Freq: Every day | ORAL | Status: DC

## 2011-06-07 MED ORDER — OMEPRAZOLE 20 MG PO CPDR: 20.0000 mg | DELAYED_RELEASE_CAPSULE | Freq: Every day | ORAL | Status: DC

## 2011-06-07 MED ORDER — VENLAFAXINE HCL ER 150 MG PO CP24: 300.0000 mg | ORAL_CAPSULE | Freq: Every day | ORAL | Status: DC

## 2011-06-07 MED ORDER — GABAPENTIN 300 MG PO CAPS: 300.0000 mg | ORAL_CAPSULE | Freq: Four times a day (QID) | ORAL | Status: DC

## 2011-06-07 NOTE — Progress Notes (Signed)
Lying quietly in bed with eyes closed.  Q 15 min safety checks in progress to maintain safety.

## 2011-06-07 NOTE — Progress Notes (Signed)
Pt discharged at this time, pt denies SI/HI, discharge instructions provided and pt verbalized understanding, prescription and supply of medications provided, all belongings returned

## 2011-06-07 NOTE — Progress Notes (Signed)
[facility] Group Notes:  (Counselor/Nursing/MHT/Case Management/Adjunct)  06/07/2011 3:41 PM  Type of Therapy:  Group Therapy at 11  Participation Level:  Active  Participation Quality:  Appropriate  Affect:  Appropriate  Cognitive:  Alert and Oriented  Insight:  Good  Engagement in Group:  Good  Engagement in Therapy:  Good  Modes of Intervention:  Clarification, Socialization and Support  Summary of Progress/Problems:  Patient contributed to discussion on a balanced life and shared that her life has been mostly out of balance due to "my tendency to try to convince everyone that I am okay, that they don't have to worry about me, meantime 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 [employer] when she returns   [name] 06/07/2011, 3:41 PM  [facility] Group Notes:  (Counselor/Nursing/MHT/Case Management/Adjunct)  06/07/2011 3:49 PM  Type of Therapy:  Group Therapy  Participation Level:  Minimal  Participation Quality:  Sharing  Affect:  Flat and distant  Cognitive:  Oriented yet not present  Insight:  Good  Engagement in Group:  Limited  Engagement in Therapy:  Limited  Modes of Intervention:  Activity and Clarification  Summary of Progress/Problems:patient shared that she" felt immobile when things were out of balance, broken and unable to move. When I think of balance I think of the world in harmony, being at the coast watching the sun rise; what could be more balanced?"  Patient seemed distant yet very intentional when she spoke and others responded to her   [name] 06/07/2011, 3:49 PM

## 2011-06-07 NOTE — Progress Notes (Signed)
06/07/2011         Time: 1415      Group Topic/Focus: The focus of this group is on enhancing patients' problem solving skills, which involves identifying the problem, brainstorming solutions and choosing and trying a solution.   Participation Level: Minimal  Participation Quality: Redirectable  Affect: Appropriate  Cognitive: Oriented  Additional Comments: Patient focused on discharge, engaging in side conversations with a female peer much of group. When redirected patient was able to participate appropriately with the rest of her peers.   Joyanne Eddinger 06/07/2011 3:53 PM

## 2011-06-07 NOTE — Treatment Plan (Signed)
Interdisciplinary Treatment Plan Update (Adult)  Date: 06/07/2011  Time Reviewed: 9:54 AM   Progress in Treatment: Attending groups: Yes Participating in groups: Yes Taking medication as prescribed: Yes Tolerating medication: Yes   Family/Significant other contact made:   Patient understands diagnosis:  Yes Discussing patient identified problems/goals with staff:  Yes Medical problems stabilized or resolved:  Yes Denies suicidal/homicidal ideation: Yes  In tx team Issues/concerns per patient self-inventory:  All positive comments Other:  New problem(s) identified: N/A  Reason for Continuation of Hospitalization: Other; describe D/C today  Interventions implemented related to continuation of hospitalization:   Additional comments:  Estimated length of stay:D/C today  Discharge Plan:Go stay with friend,  Screening for admission at [facility] on Monday  New goal(s): N/A  Review of initial/current patient goals per problem list:   1.  Goal(s): Safely detox from benzos  Met:  Yes  Target date:2/28  As evidenced by:No withdrawal symptoms  2.  Goal (s):  Met:  Yes  Target date:  As evidenced by:  3.  Goal(s):  Met:  Yes  Target date:  As evidenced by:  4.  Goal(s):  Met:  Yes  Target date:  As evidenced by:  Attendees: Patient: Stephanie Gregory  06/07/2011 9:54 AM  Family:     Physician:  Lupe Carney 06/07/2011 9:54 AM   Nursing: Carolynn Comment   06/07/2011 9:54 AM   Case Manager:  Richelle Ito, LCSW 06/07/2011 9:54 AM   Counselor:  Ronda Fairly, LCSWA 06/07/2011 9:54 AM   Other:     Other:     Other:     Other:      Scribe for Treatment Team:   Ida Rogue, 06/07/2011 9:54 AM

## 2011-06-07 NOTE — Progress Notes (Signed)
Pt states she slept fair/denies SI/HI/pt to be discharged today.

## 2011-06-07 NOTE — [facility] Suicide Risk Assessment (Signed)
Suicide Risk Assessment  Discharge Assessment      Demographic factors:  See chart.   Current Mental Status: Patient seen and evaluated. Chart reviewed. Patient stated that her mood was "good". Slept "really good" last night.  Looking forward to going to [facility] on the 4th and will be staying with a friend until that time.  Her affect was mood congruent and euthymic. She denied any current thoughts of self injurious behavior, suicidal ideation or homicidal ideation. There were no auditory or visual hallucinations, paranoia, delusional thought processes, or mania noted.  Thought process was linear and goal directed.  No psychomotor agitation or retardation was noted. Speech was normal rate, tone and volume. Eye contact was good. Judgment and insight are fair.  Patient has been up and engaged on the unit.  No acute safety concerns reported from team.  No sig withdrawal s/s noted at this time.  VS:  Filed Vitals:   06/07/11 0611  BP: 101/69  Pulse: 96  Temp:   Resp:      Loss Factors:  Loss Factors: Legal issues;Decrease in vocational status (on probation)  Historical Factors:  Historical Factors: Family history of mental illness or substance abuse;Impulsivity (dad-etoh); Hx opioid dependence; Hx residential Tx  Risk Reduction Factors:  Risk Reduction Factors: Responsible for children under 64 years of age;Sense of responsibility to family;Living with another person, especially a relative;Positive social support;Positive therapeutic relationship; spirituality  CLINICAL FACTORS: Benzodiazepine Dependence; Opioid Dependence, in reported remission; Migraines; SIMD  COGNITIVE FEATURES THAT CONTRIBUTE TO RISK: none.  SUICIDE RISK: Pt viewed as a chronic moderate increased risk of harm to self in light of her past hx and risk factors.  No acute safety concerns since on the unit.  Pt contracting for safety and requesting discharge.  PLAN OF CARE: Pt stable for and requesting discharge. Pt  contracting for safety and does not currently meet [facility] involuntary commitment criteria for continued hospitalization.  Mental health treatment, medication management and continued sobriety will mitigate against the increased risk of harm to self and/or others.  Discussed the importance of recovery further with pt, as well as, tools to move forward in a healthy & safe manner.  Pt agreeable with the plan.  Discussed with the team.  Please see orders, follow up plans per team and full discharge summary completed by physician extender.   Lupe Carney 06/07/2011, 11:29 AM

## 2011-06-07 NOTE — Progress Notes (Signed)
[facility] Adult Inpatient Family/Significant Other Suicide Prevention Education  Suicide Prevention Education:  Education Completed; Stephanie Gregory, family friend, at [phone] has been identified by the patient as the family member/significant other with whom the patient will be residing, and identified as the person(s) who will aid the patient in the event of a mental health crisis (suicidal ideations/suicide attempt).  With written consent from the patient, the family member/significant other has been provided the following suicide prevention education, prior to the and/or following the discharge of the patient.  The suicide prevention education provided includes the following:  Suicide risk factors  Suicide prevention and interventions  National Suicide Hotline telephone number  [facility] assessment telephone number  [facility] Emergency Assistance 911  [facility] and/or Residential Mobile Crisis Unit telephone number  Request made of family/significant other to:  Remove weapons (e.g., guns, rifles, knives), all items previously/currently identified as safety concern.    Remove drugs/medications (over-the-counter, prescriptions, illicit drugs), all items previously/currently identified as a safety concern.  Stephanie Gregory reports he will secure his narcotic medications and cash on hand in lock box in his home.   The family member/significant other verbalizes understanding of the suicide prevention education information provided.  The family member/significant other agrees to remove the items of safety concern listed above.  [name] 06/07/2011, 12:44 PM

## 2011-06-07 NOTE — Progress Notes (Signed)
[facility] Case Management Discharge Plan:  Will you be returning to the same living situation after discharge: No. At discharge, do you have transportation home?:Yes,  friend Do you have the ability to pay for your medications:Yes,  mental health  Interagency Information:     Release of information consent forms completed and in the chart;  Patient's signature needed at discharge.  Patient to Follow up at:  Follow-up Information    Follow up with [facility] on 06/21/2011. (9:15)    Contact information:   [address] [address] [phone]      Follow up with [facility] on 06/11/2011. (8:00AM sharp fro screening for admission)    Contact information:   [address] [facility]  [phone]         Patient denies SI/HI:   Yes,  yes    Safety Planning and Suicide Prevention discussed:  Yes,  yes  Barrier to discharge identified:No.  Summary and Recommendations:   Stephanie Gregory 06/07/2011, 10:57 AM

## 2011-06-12 NOTE — Discharge Summary (Signed)
Physician Discharge Summary Note  Patient:  Stephanie Gregory is an 46 y.o., female MRN:  [id number] DOB:  1966/01/07 Patient phone:  [phone] (home)  Patient address:   [address] [place] [address],   Date of Admission:  06/04/2011 Date of Discharge: 06/07/2011  Discharge Diagnoses: Principal Problem:  *Benzodiazepine abuse   Axis Diagnosis:   AXIS I:  Benzodiazepine dependence; Major Depression AXIS II:  deferred AXIS III:  Migraine Headaches; Hypokalemia, repleted Past Medical History  Diagnosis Date  . Anxiety   . GERD (gastroesophageal reflux disease)   . Migraine   . DDD (degenerative disc disease)     has metal plate   AXIS IV:  deferred AXIS V:  61-70 mild symptoms  Level of Care:  OP  Hospital Course:  Shawntee presented requesting detox from benzodiazepines. Reported that she had been using 3-6 mg of alprazolam daily and then taking additional benzodiazepines off the street, sometimes up to 21 doses in the week of varying types of doses of benzodiazepines. She was feeling that her anxiety and panic were increasing and benzodiazepines were not helping and she wanted detox. She also had been staying at [facility] and they were encouraging her to live a clean life, abstinent from substances.  She was admitted for detox unit and detoxed with Librium protocol. She tolerated detox well. Gabapentin was continued to help with anxiety and her home medication of venlafaxine was also continued. She was given potassium supplements to replete a low potassium. Her detox was uneventful she remained in full contact with reality, and denied suicidal thoughts; she was ready for outpatient treatment by February 28.  Consults:  None  Significant Diagnostic Studies:  Urine Drug Screen positive for benzodiazepines.  Discharge Vitals:   Blood pressure 101/69, pulse 96, temperature 98.2 F (36.8 C), temperature source Oral, resp. rate 16, height 5\' 3"  (1.6 m), weight  47.628 kg (105 lb), last menstrual period 05/14/2011.  Mental Status Exam: See Mental Status Examination and Suicide Risk Assessment completed by Attending Physician prior to discharge.  Discharge destination:  Home  Is patient on multiple antipsychotic therapies at discharge:  No   Has Patient had three or more failed trials of antipsychotic monotherapy by history:  No  Recommended Plan for Multiple Antipsychotic Therapies: N/A   Medication List  As of 06/12/2011 12:02 PM   STOP taking these medications         ALPRAZolam 1 MG tablet         TAKE these medications      Indication    gabapentin 300 MG capsule   Commonly known as: NEURONTIN   Take 1 capsule (300 mg total) by mouth 4 (four) times daily. For anxiety and mood stability.       omeprazole 20 MG capsule   Commonly known as: PRILOSEC   Take 1 capsule (20 mg total) by mouth daily. For acid reflux.       rizatriptan 10 MG disintegrating tablet   Commonly known as: MAXALT-MLT   Take 10 mg by mouth as needed. For migraines. May repeat in 2 hours if needed       topiramate 25 MG tablet   Commonly known as: TOPAMAX   Take 2 tablets (50 mg total) by mouth daily. For migraine prevention.       traZODone 100 MG tablet   Commonly known as: DESYREL   Take 3 tablets (300 mg total) by mouth at bedtime. For sleep.  venlafaxine 150 MG 24 hr capsule   Commonly known as: EFFEXOR-XR   Take 2 capsules (300 mg total) by mouth daily. For depression and anxiety.            Follow-up Information    Follow up with [facility] on 06/21/2011. (9:15)    [address]:   [address] [place] [address] [phone]      Follow up with [facility] on 06/11/2011. (8:00AM sharp fro screening for admission)    Contact information:   [address]  [facility]  [phone]         Follow-up recommendations:  Activity:  unrestricted, diety regular    Signed: Engelbert Sevin A 06/12/2011, 12:02 PM

## 2011-06-13 NOTE — Progress Notes (Signed)
Patient Discharge Instructions:  Psychiatric Admission Assessment Note Faxed,  06/12/2011 Discharge Summary Note Faxed,   06/12/2011 After Visit Summary (AVS) Faxed,  06/12/2011 Face Sheet Faxed, 06/12/2011 Faxed to the Next Level Care provider:  06/12/2011  Faxed to [facility] @ [phone] Fax unable to transmit after multiple attempt to [facility], sent via certified mail to : [address]     [place], [place], 06/13/2011, 12:21 PM

## 2011-08-04 ENCOUNTER — Encounter (HOSPITAL_COMMUNITY): Payer: Self-pay | Admitting: *Deleted

## 2011-08-04 ENCOUNTER — Emergency Department (HOSPITAL_COMMUNITY)
Admission: EM | Admit: 2011-08-04 | Discharge: 2011-08-04 | Disposition: A | Discharge status: Home / Self Care | Payer: Self-pay | Attending: Emergency Medicine | Admitting: Emergency Medicine

## 2011-08-04 DIAGNOSIS — T148XXA Other injury of unspecified body region, initial encounter: Secondary | ICD-10-CM | POA: Insufficient documentation

## 2011-08-04 DIAGNOSIS — Z79899 Other long term (current) drug therapy: Secondary | ICD-10-CM | POA: Insufficient documentation

## 2011-08-04 DIAGNOSIS — J329 Chronic sinusitis, unspecified: Secondary | ICD-10-CM | POA: Insufficient documentation

## 2011-08-04 DIAGNOSIS — IMO0002 Reserved for concepts with insufficient information to code with codable children: Secondary | ICD-10-CM | POA: Insufficient documentation

## 2011-08-04 DIAGNOSIS — X58XXXA Exposure to other specified factors, initial encounter: Secondary | ICD-10-CM | POA: Insufficient documentation

## 2011-08-04 DIAGNOSIS — K219 Gastro-esophageal reflux disease without esophagitis: Secondary | ICD-10-CM | POA: Insufficient documentation

## 2011-08-04 DIAGNOSIS — R51 Headache: Secondary | ICD-10-CM

## 2011-08-04 DIAGNOSIS — F411 Generalized anxiety disorder: Secondary | ICD-10-CM | POA: Insufficient documentation

## 2011-08-04 MED ORDER — DEXAMETHASONE SODIUM PHOSPHATE 10 MG/ML IJ SOLN
10.0000 mg | Freq: Once | INTRAMUSCULAR | Status: AC
  Administered 2011-08-04: 10 mg via INTRAMUSCULAR
  Filled 2011-08-04: qty 1

## 2011-08-04 MED ORDER — DIPHENHYDRAMINE HCL 50 MG/ML IJ SOLN
25.0000 mg | Freq: Once | INTRAMUSCULAR | Status: AC
  Administered 2011-08-04: 25 mg via INTRAMUSCULAR
  Filled 2011-08-04: qty 1

## 2011-08-04 MED ORDER — AMOXICILLIN 500 MG PO CAPS: 500.0000 mg | ORAL_CAPSULE | Freq: Three times a day (TID) | ORAL | Status: AC

## 2011-08-04 MED ORDER — CARISOPRODOL 350 MG PO TABS: 350.0000 mg | ORAL_TABLET | Freq: Three times a day (TID) | ORAL | Status: AC

## 2011-08-04 MED ORDER — METOCLOPRAMIDE HCL 5 MG/ML IJ SOLN
10.0000 mg | Freq: Once | INTRAMUSCULAR | Status: AC
  Administered 2011-08-04: 10 mg via INTRAMUSCULAR
  Filled 2011-08-04: qty 2

## 2011-08-04 NOTE — ED Provider Notes (Signed)
History     CSN: [id number]  Arrival date & time 08/04/11  0002   First MD Initiated Contact with Patient 08/04/11 0051      Chief Complaint  Patient presents with  . Headache    HPI  History provided by the patient. Patient is a 46 year old female with history of migraine headaches, anxiety and degenerative disc disease who presents with complaints of general headache for the last 3 days. Patient also complains of pain in left shoulder and neck area with concerns for slight swelling. She denies any injury or strenuous activity. She states she has noticed some soreness and swelling to the area off-and-on for several weeks. She also complains of slight bilateral low back and hip pains. She has been taking over-the-counter pain medications without significant relief. Patient also states that when she takes Tylenol symptoms seem worse. Patient reports slight nausea with headache symptoms were at their most. She denies any episodes of vomiting. She denies any fever, chills, sweats. Patient also mentions that she may have a sinus infection. She reports history of sinus surgeries and occasionally has chronic sinus infections. She reports green mucus discharge from her right nostril. She also reports pain and pressure to the right side of the face. She states this could be adding to her headache symptoms. She has no other complaints   Past Medical History  Diagnosis Date  . Anxiety   . GERD (gastroesophageal reflux disease)   . Migraine   . DDD (degenerative disc disease)     has metal plate    Past Surgical History  Procedure Date  . Neck surgery   . Abdominal hysterectomy 1999    No family history on file.  History  Substance Use Topics  . Smoking status: Current Everyday Smoker -- 1.0 packs/day for 20 years    Types: Cigarettes  . Smokeless tobacco: Never Used  . Alcohol Use: No    OB History    Grav Para Term Preterm Abortions TAB SAB Ect Mult Living                   Review of Systems  Constitutional: Negative for fever and chills.  HENT: Positive for neck pain.   Eyes: Positive for photophobia.  Respiratory: Negative for shortness of breath.   Cardiovascular: Negative for chest pain.  Gastrointestinal: Negative for nausea and vomiting.  Musculoskeletal: Negative for back pain and joint swelling.  Neurological: Positive for headaches. Negative for dizziness, weakness, light-headedness and numbness.    Allergies  Review of patient's allergies indicates no known allergies.  Home Medications   Current Outpatient Rx  Name Route Sig Dispense Refill  . ASPIRIN-ACETAMINOPHEN-CAFFEINE 250-250-65 MG PO TABS Oral Take 1 tablet by mouth every 6 (six) hours as needed. For pain    . GOODY HEADACHE PO Oral Take by mouth 2 (two) times daily as needed. For pain    . GABAPENTIN 300 MG PO CAPS Oral Take 1 capsule (300 mg total) by mouth 4 (four) times daily. For anxiety and mood stability. 120 capsule 0  . IBUPROFEN 200 MG PO TABS Oral Take 400 mg by mouth every 8 (eight) hours as needed. For pain    . NAPROXEN SODIUM 220 MG PO TABS Oral Take 220 mg by mouth 2 (two) times daily with a meal.    . OMEPRAZOLE 20 MG PO CPDR Oral Take 1 capsule (20 mg total) by mouth daily. For acid reflux.    Marland Kitchen PSEUDOEPHEDRINE HCL 30 MG PO TABS  Oral Take 30 mg by mouth every 6 (six) hours as needed. For congestion    . RIZATRIPTAN BENZOATE 10 MG PO TBDP Oral Take 10 mg by mouth as needed. For migraines. May repeat in 2 hours if needed    . TOPIRAMATE 25 MG PO TABS Oral Take 2 tablets (50 mg total) by mouth daily. For migraine prevention.    . TRAZODONE HCL 100 MG PO TABS Oral Take 3 tablets (300 mg total) by mouth at bedtime. For sleep.    . VENLAFAXINE HCL ER 150 MG PO CP24 Oral Take 2 capsules (300 mg total) by mouth daily. For depression and anxiety.      BP 130/80  Pulse 77  Temp(Src) 97.8 F (36.6 C) (Oral)  Resp 18  SpO2 100%  Physical Exam  Nursing note and vitals  reviewed. Constitutional: She is oriented to person, place, and time. She appears well-developed and well-nourished. No distress.  HENT:  Head: Normocephalic and atraumatic.  Eyes: Conjunctivae and EOM are normal. Pupils are equal, round, and reactive to light.  Neck: Normal range of motion. Neck supple. No thyromegaly present.  Cardiovascular: Normal rate and regular rhythm.   Pulmonary/Chest: Effort normal and breath sounds normal. No stridor. No respiratory distress. She has no wheezes. She has no rales.  Abdominal: Soft.  Musculoskeletal:       Mild swelling of left trapezius area tenderness to palpation. No bruising over the skin. Total strength with shoulder shrug. No other deformities. No masses. Normal distal sensations and pulses and bilateral hands.  Neurological: She is alert and oriented to person, place, and time. She has normal strength. No cranial nerve deficit or sensory deficit. Coordination and gait normal.  Skin: Skin is warm and dry. No rash noted.  Psychiatric: She has a normal mood and affect. Her behavior is normal.    ED Course  Procedures       1. Headache   2. Sinusitis   3. Muscle strain       MDM  1:45 AM patient seen and evaluated. Patient no acute distress.        Angus Seller, [place] 08/04/11 [phone]

## 2011-08-04 NOTE — ED Notes (Signed)
The pt has had a migraine headache for3 days.  She also has pain in her back and neck and she feels bad

## 2011-08-04 NOTE — ED Notes (Signed)
Pt to ED c/o chronic lower back and L neck pain and migraine.  Pt states percocet usually helps, but [facility] and she does not have an appointment until June.

## 2011-08-04 NOTE — ED Provider Notes (Signed)
Medical screening examination/treatment/procedure(s) were performed by non-physician practitioner and as supervising physician I was immediately available for consultation/collaboration.  Rahkim Rabalais K Makeila Yamaguchi-Rasch, MD 08/04/11 0641 

## 2011-08-04 NOTE — Discharge Instructions (Signed)
Please followup with your primary care providers for your next appointment as scheduled. Take the medications you have been prescribed as instructed for the full length of time. Return to emergency room for any worsening symptoms, fever, chills, persistent nausea vomiting.   General Headache, Without Cause A general headache has no specific cause. These headaches are not life-threatening. They will not lead to other types of headaches. HOME CARE   Make and keep follow-up visits with your doctor.   Only take medicine as told by your doctor.   Try to relax, get a massage, or use your thoughts to control your body (biofeedback).   Apply cold or heat to the head and neck. Apply 3 or 4 times a day or as needed.  Finding out the results of your test Ask when your test results will be ready. Make sure you get your test results. GET HELP RIGHT AWAY IF:   You have problems with medicine.   Your medicine does not help relieve pain.   Your headache changes or becomes worse.   You feel sick to your stomach (nauseous) or throw up (vomit).   You have a temperature by mouth above 102 F (38.9 C), not controlled by medicine.   Your have a stiff neck.   You have vision loss.   You have muscle weakness.   You lose control of your muscles.   You lose balance or have trouble walking.   You feel like you are going to pass out (faint).  MAKE SURE YOU:   Understand these instructions.   Will watch this condition.   Will get help right away if you are not doing well or get worse.  Document Released: 01/03/2008 Document Revised: 03/15/2011 Document Reviewed: 01/03/2008 [facility] Patient Information 2012 [place], [place].   Muscle Strain A muscle strain (pulled muscle) happens when a muscle is over-stretched. Recovery usually takes 5 to 6 weeks.  HOME CARE   Put ice on the injured area.   Put ice in a plastic bag.   Place a towel between your skin and the bag.   Leave the ice on for  15 to 20 minutes at a time, every hour for the first 2 days.   Do not use the muscle for several days or until your doctor says you can. Do not use the muscle if you have pain.   Wrap the injured area with an elastic bandage for comfort. Do not put it on too tightly.   Only take medicine as told by your doctor.   Warm up before exercise. This helps prevent muscle strains.  GET HELP RIGHT AWAY IF:  There is increased pain or puffiness (swelling) in the affected area. MAKE SURE YOU:   Understand these instructions.   Will watch your condition.   Will get help right away if you are not doing well or get worse.  Document Released: 01/03/2008 Document Revised: 03/15/2011 Document Reviewed: 01/03/2008 [facility] Patient Information 2012 [place], [place].   Sinusitis Sinuses are air pockets within the bones of your face. The growth of bacteria within a sinus leads to infection. The infection prevents the sinuses from draining. This infection is called sinusitis. SYMPTOMS  There will be different areas of pain depending on which sinuses have become infected.  The maxillary sinuses often produce pain beneath the eyes.   Frontal sinusitis may cause pain in the middle of the forehead and above the eyes.  Other problems (symptoms) include:  Toothaches.   Colored, pus-like (purulent) drainage  from the nose.   Swelling, warmth, and tenderness over the sinus areas may be signs of infection.  TREATMENT  Sinusitis is most often determined by an exam.X-rays may be taken. If x-rays have been taken, make sure you obtain your results or find out how you are to obtain them. Your caregiver may give you medications (antibiotics). These are medications that will help kill the bacteria causing the infection. You may also be given a medication (decongestant) that helps to reduce sinus swelling.  HOME CARE INSTRUCTIONS   Only take over-the-counter or prescription medicines for pain, discomfort, or fever as  directed by your caregiver.   Drink extra fluids. Fluids help thin the mucus so your sinuses can drain more easily.   Applying either moist heat or ice packs to the sinus areas may help relieve discomfort.   Use saline nasal sprays to help moisten your sinuses. The sprays can be found at your local drugstore.  SEEK IMMEDIATE MEDICAL CARE IF:  You have a fever.   You have increasing pain, severe headaches, or toothache.   You have nausea, vomiting, or drowsiness.   You develop unusual swelling around the face or trouble seeing.  MAKE SURE YOU:   Understand these instructions.   Will watch your condition.   Will get help right away if you are not doing well or get worse.  Document Released: 03/26/2005 Document Revised: 03/15/2011 Document Reviewed: 10/23/2006 [facility] Patient Information 2012 [place], [place].   RESOURCE GUIDE  Dental Problems  Patients with Medicaid: [facility] [phone] [address]                                           [phone] [address] Phone:  [phone]                                                  Phone:  [phone]  If unable to pay or uninsured, contact:  [facility] or [facility]. to become qualified for the adult dental clinic.  Chronic Pain Problems Contact [facility]  [phone] Patients need to be referred by their primary care doctor.  Insufficient Money for Medicine Contact [employer] Way:  call "211" or [facility] 480-212-5441.  No Primary Care Doctor Call [facility]  [phone] Other agencies that provide inexpensive medical care    [facility]  [phone]    [facility]  [phone]    [facility]  [phone]    [facility]  [phone]    [facility]  781-734-1750    [facility]  [phone]  Psychological Services [facility]  [phone] [facility]  [phone] [facility]   [phone] (emergency services [phone])  Substance Abuse Resources [facility]  ([phone] [facility] (206)696-5417 [employer] 2171861966 Floydene Flock [phone] Residential & Outpatient Substance Abuse Program  6132756057  Abuse/Neglect [facility] Child Abuse Hotline 432-412-5888 [facility] Child Abuse Hotline 765-758-3506 (After Hours)  Emergency Shelter [facility] Ministries 405-525-1000  Maternity Homes Room  at the [place] [facility] [place] 262-207-2480 [facility] [phone]  MRSA Hotline #:   (319) 842-9822    [facility] Resources  [facility]     [employer]                          [facility] Dept. [address] [place]                       [address]      [address]  Blondell Reveal Phone:  [phone]                                   Phone:  [phone]                 Phone:  [phone]  [facility] Phone:  [phone]  [facility] Child Abuse Hotline [phone] (After Hours)

## 2011-08-04 NOTE — ED Notes (Signed)
Rx x 2, pt voiced understanding to f/u with [facility] as scheduled

## 2012-06-02 ENCOUNTER — Encounter (HOSPITAL_COMMUNITY): Payer: Self-pay | Admitting: Emergency Medicine

## 2012-06-02 ENCOUNTER — Emergency Department (INDEPENDENT_AMBULATORY_CARE_PROVIDER_SITE_OTHER)
Admission: EM | Admit: 2012-06-02 | Discharge: 2012-06-02 | Disposition: A | Discharge status: Home / Self Care | Payer: Self-pay | Source: Home / Self Care | Attending: Emergency Medicine | Admitting: Emergency Medicine

## 2012-06-02 DIAGNOSIS — G8929 Other chronic pain: Secondary | ICD-10-CM

## 2012-06-02 DIAGNOSIS — M549 Dorsalgia, unspecified: Secondary | ICD-10-CM

## 2012-06-02 DIAGNOSIS — M542 Cervicalgia: Secondary | ICD-10-CM

## 2012-06-02 DIAGNOSIS — M545 Low back pain: Secondary | ICD-10-CM

## 2012-06-02 LAB — POCT URINALYSIS DIP (DEVICE)
Bilirubin Urine: NEGATIVE
Glucose, UA: NEGATIVE mg/dL
Ketones, ur: NEGATIVE mg/dL
Specific Gravity, Urine: <=1.005 (ref 1.005–1.030)
Urobilinogen, UA: 0.2 mg/dL (ref 0.0–1.0)

## 2012-06-02 MED ORDER — MELOXICAM 7.5 MG PO TABS: 7.5000 mg | ORAL_TABLET | Freq: Every day | ORAL | Status: DC

## 2012-06-02 NOTE — ED Notes (Addendum)
Pt is here c/o lower back and upper back/neck pain x 1.5 week Used to go to [facility]; unable to find new PCP Hx of neck surg/metal plate and chronic back pain Reports being allergic to Flexeril and Robaxin (tongue swells up) Reports pain being constant and increases w/activity; valium helps ease the pain Denies: any new inj/trauma to site Currently see "[facility]"; on Effexor, Neurotin and Topomax  She is alert w/no signs of acute distress.

## 2012-06-02 NOTE — ED Provider Notes (Signed)
History     CSN: [id number]  Arrival date & time 06/02/12  1019   First MD Initiated Contact with Patient 06/02/12 1043      Chief Complaint  Patient presents with  . Back Pain    (Consider location/radiation/quality/duration/timing/severity/associated sxs/prior treatment) HPI Comments: Patient presents to urgent care today describing that she's been having a flareup of his lower back and neck pain for the last few days. She has been taking ibuprofen and Septra and migraine and Aleve but it doesn't work for her. She describes no recent trauma injury or variation in her physical activities. Patient describes that her pain is on both sides of her lower back radiates somewhat upwards. As well as predominantly on the left upper portion of her left shoulder ( points to the trapezium region), denies any weakness of her lower extremities, paresthesias, changes in urinary or bowel habits. No systemic symptoms such as fevers, unintentional weight loss abdominal pain or changes in appetite. Patient is mainly concerned about pain management, former patient of [facility], she has recently traveled to [place] where she is prescribed Percocet and Valium for her back pain. He is requesting this medicines for her back pain today.  Patient is a 47 y.o. female presenting with back pain. The history is provided by the patient.  Back Pain Location:  Lumbar spine Quality:  Aching Radiates to:  Does not radiate Pain severity:  Moderate Pain is:  Same all the time Timing:  Constant Progression:  Unchanged Chronicity:  Recurrent Context: not emotional stress   Worsened by:  Movement (Patient has taken Motrin, Aleve taking trazodone nothing works for her back pain and neck pain) Associated symptoms: no abdominal pain, no fever, no leg pain, no numbness, no paresthesias, no tingling and no weakness     Past Medical History  Diagnosis Date  . Anxiety   . GERD (gastroesophageal reflux disease)   . Migraine    . DDD (degenerative disc disease)     has metal plate    Past Surgical History  Procedure Laterality Date  . Neck surgery    . Tubal ligation      History reviewed. No pertinent family history.  History  Substance Use Topics  . Smoking status: Current Every Day Smoker -- 1.00 packs/day for 20 years    Types: Cigarettes  . Smokeless tobacco: Never Used  . Alcohol Use: No    OB History   Grav Para Term Preterm Abortions TAB SAB Ect Mult Living                  Review of Systems  Constitutional: Negative for fever, chills, diaphoresis, activity change and appetite change.  Gastrointestinal: Negative for abdominal pain and anal bleeding.  Musculoskeletal: Positive for back pain. Negative for myalgias, joint swelling and arthralgias.  Neurological: Negative for tingling, weakness, numbness and paresthesias.    Allergies  Review of patient's allergies indicates no known allergies.  Home Medications   Current Outpatient Rx  Name  Route  Sig  Dispense  Refill  . gabapentin (NEURONTIN) 300 MG capsule   Oral   Take 1 capsule (300 mg total) by mouth 4 (four) times daily. For anxiety and mood stability.   120 capsule   0   . rizatriptan (MAXALT-MLT) 10 MG disintegrating tablet   Oral   Take 10 mg by mouth as needed. For migraines. May repeat in 2 hours if needed         . venlafaxine (EFFEXOR-XR) 150 MG  24 hr capsule   Oral   Take 2 capsules (300 mg total) by mouth daily. For depression and anxiety.         Marland Kitchen aspirin-acetaminophen-caffeine (EXCEDRIN MIGRAINE) 250-250-65 MG per tablet   Oral   Take 1 tablet by mouth every 6 (six) hours as needed. For pain         . Aspirin-Acetaminophen-Caffeine (GOODY HEADACHE PO)   Oral   Take by mouth 2 (two) times daily as needed. For pain         . ibuprofen (ADVIL,MOTRIN) 200 MG tablet   Oral   Take 400 mg by mouth every 8 (eight) hours as needed. For pain         . naproxen sodium (ANAPROX) 220 MG tablet    Oral   Take 220 mg by mouth 2 (two) times daily with a meal.         . omeprazole (PRILOSEC) 20 MG capsule   Oral   Take 1 capsule (20 mg total) by mouth daily. For acid reflux.         . pseudoephedrine (SUDAFED) 30 MG tablet   Oral   Take 30 mg by mouth every 6 (six) hours as needed. For congestion         . topiramate (TOPAMAX) 25 MG tablet   Oral   Take 2 tablets (50 mg total) by mouth daily. For migraine prevention.         . traZODone (DESYREL) 100 MG tablet   Oral   Take 3 tablets (300 mg total) by mouth at bedtime. For sleep.           BP 120/74  Pulse 73  Temp(Src) 98 F (36.7 C) (Oral)  Resp 18  SpO2 99%  LMP 05/19/2012  Physical Exam  Nursing note and vitals reviewed. Constitutional: She appears well-developed and well-nourished.  Musculoskeletal: She exhibits tenderness.       Cervical back: She exhibits tenderness, pain and spasm. She exhibits no bony tenderness, no swelling, no edema, no deformity, no laceration and normal pulse.       Back:  Neurological: She is alert.  Skin: Skin is warm. No erythema.    ED Course  Procedures (including critical care time)  Labs Reviewed  POCT URINALYSIS DIP (DEVICE) - Abnormal; Notable for the following:    Hgb urine dipstick SMALL (*)    All other components within normal limits   No results found.   No diagnosis found.    MDM  None trauma / injury Exacerbation Chronic neck and lumbar pain-( without symptoms or signs of neurological or vascular deficits)  have discussed the alternative of using meloxicam for pain control. She has not tried that in the past. Was offered a Toradol shot as patient is not accompanied by anybody. She has been given resources in. At the adult Center perception area to inquire about her eligibility criteria to be established there is a patient. ( Former [facility] patient)       Jimmie Molly, MD 06/02/12 [phone]

## 2013-05-04 ENCOUNTER — Other Ambulatory Visit: Payer: Self-pay | Admitting: Obstetrics & Gynecology

## 2013-05-29 ENCOUNTER — Encounter (HOSPITAL_COMMUNITY): Payer: Self-pay | Admitting: Pharmacist

## 2013-06-12 ENCOUNTER — Encounter (HOSPITAL_COMMUNITY): Payer: Self-pay

## 2013-06-12 ENCOUNTER — Encounter (HOSPITAL_COMMUNITY)
Admission: RE | Admit: 2013-06-12 | Discharge: 2013-06-12 | Disposition: A | Discharge status: Home / Self Care | Payer: BC Managed Care – PPO | Source: Ambulatory Visit | Attending: Obstetrics & Gynecology | Admitting: Obstetrics & Gynecology

## 2013-06-12 DIAGNOSIS — Z01812 Encounter for preprocedural laboratory examination: Secondary | ICD-10-CM | POA: Insufficient documentation

## 2013-06-12 LAB — CBC
HEMATOCRIT: 39.2 % (ref 36.0–46.0)
HEMOGLOBIN: 13.1 g/dL (ref 12.0–15.0)
MCH: 29.6 pg (ref 26.0–34.0)
MCHC: 33.4 g/dL (ref 30.0–36.0)
MCV: 88.7 fL (ref 78.0–100.0)
Platelets: 275 10*3/uL (ref 150–400)
RBC: 4.42 MIL/uL (ref 3.87–5.11)
RDW: 14.3 % (ref 11.5–15.5)
WBC: 13.9 10*3/uL — ABNORMAL HIGH (ref 4.0–10.5)

## 2013-06-12 NOTE — Patient Instructions (Signed)
[place]  06/12/2013   Your procedure is scheduled on:  06/16/13  Enter through the Main Entrance of [facility] up the phone at the desk and dial [phone].   Call this number if you have problems the morning of surgery: [phone]   Remember:   Do not eat food:After Midnight.  Do not drink clear liquids: After Midnight.  Take these medicines the morning of surgery with A SIP OF WATER: Effexor, Topamax   Do not wear jewelry, make-up or nail polish.  Do not wear lotions, powders, or perfumes. You may wear deodorant.  Do not shave 48 hours prior to surgery.  Do not bring valuables to the hospital.  [facility] is not   responsible for any belongings or valuables brought to the hospital.  Contacts, dentures or bridgework may not be worn into surgery.  Leave suitcase in the car. After surgery it may be brought to your room.  For patients admitted to the hospital, checkout time is 11:00 AM the day of              discharge.   Patients discharged the day of surgery will not be allowed to drive             home.  Name and phone number of your driver: NA  Special Instructions:      Please read over the following fact sheets that you were given:   Surgical Site Infection Prevention

## 2013-06-16 ENCOUNTER — Ambulatory Visit (HOSPITAL_COMMUNITY)
Admission: RE | Admit: 2013-06-16 | Discharge: 2013-06-16 | Disposition: A | Discharge status: Home / Self Care | Payer: BC Managed Care – PPO | Source: Ambulatory Visit | Attending: Obstetrics & Gynecology | Admitting: Obstetrics & Gynecology

## 2013-06-16 ENCOUNTER — Encounter (HOSPITAL_COMMUNITY)
Admission: RE | Disposition: A | Discharge status: Home / Self Care | Payer: Self-pay | Source: Ambulatory Visit | Attending: Obstetrics & Gynecology

## 2013-06-16 ENCOUNTER — Encounter (HOSPITAL_COMMUNITY): Payer: BC Managed Care – PPO | Admitting: Anesthesiology

## 2013-06-16 ENCOUNTER — Ambulatory Visit (HOSPITAL_COMMUNITY): Payer: BC Managed Care – PPO | Admitting: Anesthesiology

## 2013-06-16 ENCOUNTER — Encounter (HOSPITAL_COMMUNITY): Payer: Self-pay | Admitting: Obstetrics & Gynecology

## 2013-06-16 DIAGNOSIS — N840 Polyp of corpus uteri: Secondary | ICD-10-CM | POA: Insufficient documentation

## 2013-06-16 DIAGNOSIS — N802 Endometriosis of fallopian tube: Secondary | ICD-10-CM | POA: Insufficient documentation

## 2013-06-16 DIAGNOSIS — F17200 Nicotine dependence, unspecified, uncomplicated: Secondary | ICD-10-CM | POA: Insufficient documentation

## 2013-06-16 DIAGNOSIS — N946 Dysmenorrhea, unspecified: Secondary | ICD-10-CM | POA: Insufficient documentation

## 2013-06-16 DIAGNOSIS — N831 Corpus luteum cyst of ovary, unspecified side: Secondary | ICD-10-CM | POA: Insufficient documentation

## 2013-06-16 DIAGNOSIS — K219 Gastro-esophageal reflux disease without esophagitis: Secondary | ICD-10-CM | POA: Insufficient documentation

## 2013-06-16 DIAGNOSIS — D279 Benign neoplasm of unspecified ovary: Secondary | ICD-10-CM | POA: Insufficient documentation

## 2013-06-16 DIAGNOSIS — N80209 Endometriosis of unspecified fallopian tube, unspecified depth: Secondary | ICD-10-CM | POA: Insufficient documentation

## 2013-06-16 DIAGNOSIS — N938 Other specified abnormal uterine and vaginal bleeding: Secondary | ICD-10-CM | POA: Insufficient documentation

## 2013-06-16 DIAGNOSIS — N949 Unspecified condition associated with female genital organs and menstrual cycle: Secondary | ICD-10-CM | POA: Insufficient documentation

## 2013-06-16 HISTORY — DX: Unspecified abnormal cytological findings in specimens from vagina: R87.629

## 2013-06-16 HISTORY — PX: DILATATION & CURRETTAGE/HYSTEROSCOPY WITH RESECTOCOPE: SHX5572

## 2013-06-16 HISTORY — PX: LAPAROSCOPY: SHX197

## 2013-06-16 HISTORY — DX: Mental disorder, not otherwise specified: F99

## 2013-06-16 LAB — SURGICAL PCR SCREEN
MRSA, PCR: POSITIVE — AB
STAPHYLOCOCCUS AUREUS: POSITIVE — AB

## 2013-06-16 LAB — PREGNANCY, URINE: Preg Test, Ur: NEGATIVE

## 2013-06-16 SURGERY — DILATATION & CURETTAGE/HYSTEROSCOPY WITH RESECTOCOPE
Anesthesia: General | Site: Vagina

## 2013-06-16 MED ORDER — ONDANSETRON HCL 4 MG/2ML IJ SOLN
INTRAMUSCULAR | Status: DC | PRN
  Administered 2013-06-16: 4 mg via INTRAVENOUS

## 2013-06-16 MED ORDER — GLYCOPYRROLATE 0.2 MG/ML IJ SOLN
INTRAMUSCULAR | Status: AC
  Filled 2013-06-16: qty 4

## 2013-06-16 MED ORDER — MUPIROCIN 2 % EX OINT
TOPICAL_OINTMENT | CUTANEOUS | Status: AC
  Filled 2013-06-16: qty 22

## 2013-06-16 MED ORDER — PROPOFOL 10 MG/ML IV BOLUS
INTRAVENOUS | Status: DC | PRN
  Administered 2013-06-16: 150 mg via INTRAVENOUS

## 2013-06-16 MED ORDER — SILVER NITRATE-POT NITRATE 75-25 % EX MISC
CUTANEOUS | Status: AC
Start: 2013-06-16 — End: 2013-06-16
  Filled 2013-06-16: qty 1

## 2013-06-16 MED ORDER — ACETAMINOPHEN 160 MG/5ML PO SOLN
ORAL | Status: AC
  Filled 2013-06-16: qty 20.3

## 2013-06-16 MED ORDER — LACTATED RINGERS IV SOLN: INTRAVENOUS | Status: DC

## 2013-06-16 MED ORDER — ONDANSETRON HCL 4 MG/2ML IJ SOLN
INTRAMUSCULAR | Status: AC
  Filled 2013-06-16: qty 2

## 2013-06-16 MED ORDER — LIDOCAINE HCL 1 % IJ SOLN
INTRAMUSCULAR | Status: DC | PRN
  Administered 2013-06-16: 10 mL

## 2013-06-16 MED ORDER — MIDAZOLAM HCL 5 MG/5ML IJ SOLN
INTRAMUSCULAR | Status: DC | PRN
  Administered 2013-06-16: 2 mg via INTRAVENOUS

## 2013-06-16 MED ORDER — MIDAZOLAM HCL 2 MG/2ML IJ SOLN
INTRAMUSCULAR | Status: AC
  Filled 2013-06-16: qty 2

## 2013-06-16 MED ORDER — GLYCOPYRROLATE 0.2 MG/ML IJ SOLN
INTRAMUSCULAR | Status: DC | PRN
  Administered 2013-06-16: 0.6 mg via INTRAVENOUS

## 2013-06-16 MED ORDER — ACETAMINOPHEN 160 MG/5ML PO SOLN: 975.0000 mg | Freq: Once | ORAL | Status: DC

## 2013-06-16 MED ORDER — FENTANYL CITRATE 0.05 MG/ML IJ SOLN
INTRAMUSCULAR | Status: AC
  Filled 2013-06-16: qty 2

## 2013-06-16 MED ORDER — BUPIVACAINE HCL 0.25 % IJ SOLN
INTRAMUSCULAR | Status: DC | PRN
  Administered 2013-06-16: 20 mL

## 2013-06-16 MED ORDER — LIDOCAINE HCL (CARDIAC) 20 MG/ML IV SOLN
INTRAVENOUS | Status: AC
  Filled 2013-06-16: qty 5

## 2013-06-16 MED ORDER — FENTANYL CITRATE 0.05 MG/ML IJ SOLN
25.0000 ug | INTRAMUSCULAR | Status: DC | PRN
  Administered 2013-06-16 (×2): 50 ug via INTRAVENOUS

## 2013-06-16 MED ORDER — KETOROLAC TROMETHAMINE 30 MG/ML IJ SOLN: 15.0000 mg | Freq: Once | INTRAMUSCULAR | Status: DC | PRN

## 2013-06-16 MED ORDER — MUPIROCIN 2 % EX OINT
TOPICAL_OINTMENT | Freq: Two times a day (BID) | CUTANEOUS | Status: DC
  Administered 2013-06-16: 10:00:00 via NASAL

## 2013-06-16 MED ORDER — OXYCODONE-ACETAMINOPHEN 5-325 MG PO TABS
ORAL_TABLET | ORAL | Status: AC
  Filled 2013-06-16: qty 1

## 2013-06-16 MED ORDER — ROCURONIUM BROMIDE 100 MG/10ML IV SOLN
INTRAVENOUS | Status: DC | PRN
Start: 2013-06-16 — End: 2013-06-16
  Administered 2013-06-16: 30 mg via INTRAVENOUS
  Administered 2013-06-16: 20 mg via INTRAVENOUS

## 2013-06-16 MED ORDER — ACETAMINOPHEN 160 MG/5ML PO SOLN
ORAL | Status: AC
  Filled 2013-06-16: qty 40.6

## 2013-06-16 MED ORDER — PROPOFOL 10 MG/ML IV EMUL
INTRAVENOUS | Status: AC
  Filled 2013-06-16: qty 20

## 2013-06-16 MED ORDER — NEOSTIGMINE METHYLSULFATE 1 MG/ML IJ SOLN
INTRAMUSCULAR | Status: AC
  Filled 2013-06-16: qty 1

## 2013-06-16 MED ORDER — FENTANYL CITRATE 0.05 MG/ML IJ SOLN
INTRAMUSCULAR | Status: AC
  Filled 2013-06-16: qty 5

## 2013-06-16 MED ORDER — NEOSTIGMINE METHYLSULFATE 1 MG/ML IJ SOLN
INTRAMUSCULAR | Status: DC | PRN
  Administered 2013-06-16: 4 mg via INTRAVENOUS

## 2013-06-16 MED ORDER — KETOROLAC TROMETHAMINE 30 MG/ML IJ SOLN
INTRAMUSCULAR | Status: AC
  Filled 2013-06-16: qty 1

## 2013-06-16 MED ORDER — DEXAMETHASONE SODIUM PHOSPHATE 10 MG/ML IJ SOLN
INTRAMUSCULAR | Status: AC
  Filled 2013-06-16: qty 1

## 2013-06-16 MED ORDER — MEPERIDINE HCL 25 MG/ML IJ SOLN: 6.2500 mg | INTRAMUSCULAR | Status: DC | PRN

## 2013-06-16 MED ORDER — LIDOCAINE HCL 1 % IJ SOLN
INTRAMUSCULAR | Status: AC
  Filled 2013-06-16: qty 20

## 2013-06-16 MED ORDER — LACTATED RINGERS IV SOLN
INTRAVENOUS | Status: DC
  Administered 2013-06-16 (×3): via INTRAVENOUS

## 2013-06-16 MED ORDER — HEPARIN SODIUM (PORCINE) 5000 UNIT/ML IJ SOLN
INTRAMUSCULAR | Status: AC
  Filled 2013-06-16: qty 1

## 2013-06-16 MED ORDER — BUPIVACAINE HCL (PF) 0.25 % IJ SOLN
INTRAMUSCULAR | Status: AC
  Filled 2013-06-16: qty 30

## 2013-06-16 MED ORDER — DEXAMETHASONE SODIUM PHOSPHATE 10 MG/ML IJ SOLN
INTRAMUSCULAR | Status: DC | PRN
  Administered 2013-06-16: 10 mg via INTRAVENOUS

## 2013-06-16 MED ORDER — METOCLOPRAMIDE HCL 5 MG/ML IJ SOLN: 10.0000 mg | Freq: Once | INTRAMUSCULAR | Status: DC | PRN

## 2013-06-16 MED ORDER — FENTANYL CITRATE 0.05 MG/ML IJ SOLN
INTRAMUSCULAR | Status: DC | PRN
  Administered 2013-06-16 (×3): 50 ug via INTRAVENOUS
  Administered 2013-06-16: 100 ug via INTRAVENOUS
  Administered 2013-06-16 (×2): 50 ug via INTRAVENOUS

## 2013-06-16 MED ORDER — KETOROLAC TROMETHAMINE 30 MG/ML IJ SOLN
INTRAMUSCULAR | Status: DC | PRN
  Administered 2013-06-16: 30 mg via INTRAVENOUS

## 2013-06-16 MED ORDER — ACETAMINOPHEN 160 MG/5ML PO SOLN: 975.0000 mg | Freq: Four times a day (QID) | ORAL | Status: DC | PRN

## 2013-06-16 MED ORDER — METHYLENE BLUE 1 % INJ SOLN
INTRAMUSCULAR | Status: AC
  Filled 2013-06-16: qty 10

## 2013-06-16 MED ORDER — LACTATED RINGERS IR SOLN
Status: DC | PRN
  Administered 2013-06-16: 1500 mL

## 2013-06-16 MED ORDER — OXYCODONE-ACETAMINOPHEN 5-325 MG PO TABS
1.0000 | ORAL_TABLET | ORAL | Status: DC | PRN
  Administered 2013-06-16: 1 via ORAL

## 2013-06-16 SURGICAL SUPPLY — 38 items
ADH SKN CLS APL DERMABOND .7 (GAUZE/BANDAGES/DRESSINGS) ×2
BAG SPEC RTRVL LRG 6X4 10 (ENDOMECHANICALS)
BNDG ADH 5X3 H2O RPLNT NS (GAUZE/BANDAGES/DRESSINGS) ×4
BNDG COHESIVE 3X5 WHT NS (GAUZE/BANDAGES/DRESSINGS) ×4 IMPLANT
CABLE HIGH FREQUENCY MONO STRZ (ELECTRODE) IMPLANT
CATH ROBINSON RED A/P 16FR (CATHETERS) ×4 IMPLANT
CHLORAPREP W/TINT 26ML (MISCELLANEOUS) ×4 IMPLANT
CLOSURE WOUND 1/2 X4 (GAUZE/BANDAGES/DRESSINGS) ×1
CLOTH BEACON ORANGE TIMEOUT ST (SAFETY) ×4 IMPLANT
DERMABOND ADVANCED (GAUZE/BANDAGES/DRESSINGS) ×2
DERMABOND ADVANCED .7 DNX12 (GAUZE/BANDAGES/DRESSINGS) ×2 IMPLANT
DRSG COVADERM PLUS 2X2 (GAUZE/BANDAGES/DRESSINGS) ×2 IMPLANT
GLOVE BIO SURGEON STRL SZ 6.5 (GLOVE) ×3 IMPLANT
GLOVE BIO SURGEONS STRL SZ 6.5 (GLOVE) ×1
GLOVE BIOGEL PI IND STRL 7.0 (GLOVE) ×2 IMPLANT
GLOVE BIOGEL PI INDICATOR 7.0 (GLOVE) ×2
GOWN STRL REUS W/TWL LRG LVL3 (GOWN DISPOSABLE) ×8 IMPLANT
LIGASURE 5MM LAPAROSCOPIC (INSTRUMENTS) ×2 IMPLANT
NEEDLE INSUFFLATION 120MM (ENDOMECHANICALS) IMPLANT
NS IRRIG 1000ML POUR BTL (IV SOLUTION) ×4 IMPLANT
PACK LAPAROSCOPY BASIN (CUSTOM PROCEDURE TRAY) ×4 IMPLANT
POUCH SPECIMEN RETRIEVAL 10MM (ENDOMECHANICALS) IMPLANT
PROTECTOR NERVE ULNAR (MISCELLANEOUS) ×4 IMPLANT
SEALER TISSUE G2 CVD JAW 35 (ENDOMECHANICALS) IMPLANT
SEALER TISSUE G2 CVD JAW 45CM (ENDOMECHANICALS)
SET IRRIG TUBING LAPAROSCOPIC (IRRIGATION / IRRIGATOR) IMPLANT
SOLUTION ELECTROLUBE (MISCELLANEOUS) ×2 IMPLANT
STRIP CLOSURE SKIN 1/2X4 (GAUZE/BANDAGES/DRESSINGS) ×1 IMPLANT
SUT MNCRL AB 4-0 PS2 18 (SUTURE) ×2 IMPLANT
SUT MON AB 4-0 PS1 27 (SUTURE) ×4 IMPLANT
SUT VICRYL 0 UR6 27IN ABS (SUTURE) ×4 IMPLANT
SYR 5ML LL (SYRINGE) IMPLANT
TOWEL OR 17X24 6PK STRL BLUE (TOWEL DISPOSABLE) ×8 IMPLANT
TROCAR XCEL DIL TIP R 11M (ENDOMECHANICALS) IMPLANT
TROCAR XCEL NON-BLD 11X100MML (ENDOMECHANICALS) ×2 IMPLANT
TROCAR XCEL NON-BLD 5MMX100MML (ENDOMECHANICALS) ×4 IMPLANT
WARMER LAPAROSCOPE (MISCELLANEOUS) ×4 IMPLANT
WATER STERILE IRR 1000ML POUR (IV SOLUTION) ×4 IMPLANT

## 2013-06-16 NOTE — Transfer of Care (Signed)
Immediate Anesthesia Transfer of Care Note  Patient: Stephanie Gregory  Procedure(s) Performed: Procedure(s): DILATATION & CURETTAGE/HYSTEROSCOPY WITH RESECTOCOPE/CERVICAL POLYPECTOMY (N/A) LAPAROSCOPY OPERATIVE SALPINGO OOPHORECTOMY (N/A)  Patient Location: PACU  Anesthesia Type:General  Level of Consciousness: awake, alert  and oriented  Airway & Oxygen Therapy: Patient Spontanous Breathing and Patient connected to nasal cannula oxygen  Post-op Assessment: Report given to PACU RN and Post -op Vital signs reviewed and stable  Post vital signs: Reviewed and stable  Complications: No apparent anesthesia complications

## 2013-06-16 NOTE — Op Note (Signed)
OPERATIVE NOTE  KAMIRYN BEZANSON  DOB:    1966/01/04  MRN:    [id number]  CSN:    [id number]  Date of Surgery:  06/16/2013  Preoperative Diagnosis: Left Ovarian Cyst Abnormal uterine bleeding Endometrial polyp  Postoperative Diagnosis:  Same as above plus pelvic adhesive disease  Procedure: 1. Dilatation and curettage 2. Diagnostic hysteroscopy 3. Operative laparoscopy / left salpingo-opherectomy 4. Adhesiolysis   Surgeon: Mady Haagensen. Alwyn Pea, M.D.  Assistant: Dr. Barbaraann Rondo, M.D.   Anesthetic: General endotracheal  Fluids: 1247mL LR  UOP: 75 mL  Catheter: Foley during case  Indication:  [name] is a 48 y.o. year old female,presents for removal of her left fallopian tube and ovary and endometrial polypectomy which was found on pelvic ultrasound.  Patient with a clinical history of pelvic pain, and abnormal uterine bleeding. Risks, benefits, alternatives and indication was explained to patient prior to the procedure.  Findings: Exam under anesthesia: Normal uterus, mobile anteverted, no palpable masses, Fullness palpable left adnexa  Vagina: normal ruggae, cervix grossly normal Hysteroscopy: polypoid tissue in endometrium, both ostia visualized Laparoscopy: Left pelvic adhesive disease, endometriotic disease , normal appearing uterus and right tube and ovary. Appendix visualized and normal in appearance Bowel and liver appeared normal  Procedure:  The patient was taken to the operating room where general anesthesia was found to be adequate. She was placed in dorsal lithotomy position and examined under anesthesia was performed with findings noted above.  The patient's abdomen, perineum, and vagina were prepped with sterile solution. The bladder was drained of urine with a foley catheter.  A sterile operative graves speculum was placed and the anterior lip of the cervix held with a single-toothed tenaculum. The uterus was sounded to 7cm. The cervix was  dilated to 15 Fr with Hanks dilators. A 62mm hysteroscope was introduced into the uterine cavity under direct video visualization and the cavity distended with sterile normal saline. A complete survey was performed with findings noted above. The hysteroscope was removed and a sharp curettage was performed with moderate amount of polypoid tissue evacuated and sent off to Pathology.  A Hulka tenaculum was then placed inside the uterus. Surgeon gloves were changed and attention was turned to the abdomen.  The subumbilical area was injected with half percent Marcaine with epinephrine. A subumbilical incision was made using an 11 blade scalpel and the peritoneum was entered directly using a 11 mm Excel Visaport with 0 degree 90mm laparoscope attached. Confirmation of entry into the peritoneum was confirmed with opening pressure of CO2 and direct visualization. Pneumoperitoneum was obtained using approximately 2 L of CO2 gas.   The pelvic organs were inspected with findings as mentioned above. Half percent Marcaine with epinephrine was injected in the left lower quadrant. A 5 mm trocar was placed and the abdominal cavity under direct visualization. Another 66mm trocar site was obtained on the right lower abdomen. There was no noted injury with placement of any of the trocars. Pelvic washings were performed and sent off to Cytology. The left infundibulopelvic ligament was identified and retroperitoneal dissection elucidated the ureter coursing well below the operative field. The Left IP was coagulated and transected using the 88mm Ligasure. Hemostasis was adequate. The left ovary was adherent to the pelvic side wall and bowel and adhesiolysis was performed using endoshears with monopolar cautery.  The Uteroovarian ligament was then transected using the Ligasure as well as the Cornual end of the salpinx. The specimen was free from the uterus and  placed in the posterior cul de sac. At the operative site there was slight oozing  from peritoneal edge which was alleviated using cautery.  The specimen was removed through the umbilicus with an endocatch. The pelvis was inspected again and noted to be hemostatic.  The pneumoperitoneum was allowed to escape. All instruments were removed. The subumbilical fascia was closed using 0 Vicryl on a UR-6 needle.  The skin was reapproximated using 3-0 Monocryl. Steri strips, and op sites placed on incisions. All instrumentation was removed from the vagina and the cervix noted to be hemostatic. Sponge, needle, instrument counts  were correct. The estimated blood loss was less than 50 cc. The patient tolerated her procedure well. She was awakened from her anesthetic without difficulty. She was transported to the recovery room in stable condition. The left ovary and fallopian tube with endometrial curettings were sent to pathology.    Rogelio Seen S. Alwyn Pea, M.D.

## 2013-06-16 NOTE — H&P (Signed)
Stephanie Gregory is an 48 y.o. female G15P3 with long history of dysmenorrhea, heavy menstrual cycles and left sided pelvic pain that is intermittent in nature. Pelvic sonogram demonstrated 1.0cm endometrial polyp, with small left dermoid on left ovary. She is admitted to GYN for scheduled D&C polypectomy, hysteroscopy, laparoscopic Left salpingoopherectomy.   Pertinent Gynecological History: Menses: flow is excessive with use of 3 super pads or tampons on heaviest days Bleeding: intermenstrual bleeding Contraception: tubal ligation DES exposure: denies Blood transfusions: none Sexually transmitted diseases: no past history Previous GYN Procedures: BTL  Last mammogram: normal Date: 05/2013 Last pap: normal Date: 05/03/2013 OB History: G5, P3   Menstrual History: Menarche age: 29  No LMP recorded.    Past Medical History  Diagnosis Date  . Anxiety   . GERD (gastroesophageal reflux disease)   . Migraine   . DDD (degenerative disc disease)     has metal plate    Past Surgical History  Procedure Laterality Date  . Neck surgery    . Tubal ligation    . Nasal sinus surgery      Family history -no h/o breast, uterine, ovarian or colon cancer  Social History:  reports that she has been smoking Cigarettes.  She has a 20 pack-year smoking history. She has never used smokeless tobacco. She reports that she uses illicit drugs (Benzodiazepines) about 21 times per week. She reports that she does not drink alcohol.  Allergies: No Known Allergies  MEDS:  Effexor 75 mg tablet  Take 1 tablet(s) twice a day by oral route.   hydrocodone 7.5 mg-acetaminophen 325 mg tablet  one tablet every 4-5 hours for severe pain with period.  Topamax 25 mg tablet  Take 2 tablet(s) twice a day by oral route.  trazodone 150 mg tablet  Take by oral route.      Review of Systems  Constitutional: Positive for malaise/fatigue.  HENT: Negative.   Eyes: Negative.   Respiratory: Negative.    Gastrointestinal: Positive for abdominal pain.  Musculoskeletal: Positive for back pain.  Skin: Negative.   Endo/Heme/Allergies: Negative.   Psychiatric/Behavioral: Positive for depression.    There were no vitals taken for this visit. Physical Exam  Constitutional: She is oriented to person, place, and time.  Genitourinary:  Vulva: no masses, atrophy, or lesions. Vagina: no tenderness, erythema, cystocele, rectocele, abnormal vaginal discharge, or vesicle(s) or ulcers. Cervix: no discharge or cervical motion tenderness and grossly normal and sample taken for a Pap smear. Uterus: normal size and shape and midline, mobile, non-tender, and no uterine prolapse. Bladder/Urethra: no urethral discharge or mass and normal meatus, bladder non distended, and Urethra well supported. Adnexa/Parametria: no parametrial tenderness or mass and no adnexal tenderness or ovarian mass.  Musculoskeletal: Normal range of motion.  Neurological: She is alert and oriented to person, place, and time.  Psychiatric: She has a normal mood and affect.    ULTRASOUND:  1.0cm endometrial polyp 2.5m left ovarian dermoid cyst, with normal doppler flow  Assessment/Plan: 48 y/o P3with left ovarian cyst, appears like a dermoid on u/s with low suspicion for malignancy.  Patient also with notable abnormal bleeding and polyps seen on pelvic US.  Patient was counseled on the risks, benefits, indications and alternatives of a laparoscopic left ovarian cystectomy vs LSO. Patient desires removal of  the tube and ovary due to her age. She understands that she will not undergo menopausal symptoms as she has one functional ovary. Patient also counseled that if there is evidence of an  injury at the time of laparoscopy, there may be a need to convert to an open laparotomy procedure. The patient understands that if pathology shows malignancy she may need further staging with [facility]. Risks of surgery were explained to include, but  are not limited to that of infection or blood loss, injury to bowel, bladder, ureters, nerves arteries or veins. She understands the risks and benefits and desires definitive surgical management of her L adnexal mass.  We also discussed R/B/A/I of D&C polypectomy to include uterine perforation. She understands that the endometrial curettings will be sent also to Pathology.   1. Patient on-call to OR June 16, 2013 for Lapaorscopic salpingoopherectomy / D&C polypectomy  2. Consent will be signed and on the chart day of surgery  3. Pre-op labs complete    Keison Glendinning, [place] 06/16/2013, 8:33 AM

## 2013-06-16 NOTE — Anesthesia Postprocedure Evaluation (Signed)
  Anesthesia Post-op Note  Patient: Stephanie Gregory  Procedure(s) Performed: Procedure(s): DILATATION & CURETTAGE/HYSTEROSCOPY WITH RESECTOCOPE/CERVICAL POLYPECTOMY (N/A) LAPAROSCOPY OPERATIVE SALPINGO OOPHORECTOMY (N/A) Patient is awake and responsive. Pain and nausea are reasonably well controlled. Vital signs are stable and clinically acceptable. Oxygen saturation is clinically acceptable. There are no apparent anesthetic complications at this time. Patient is ready for discharge.

## 2013-06-16 NOTE — Discharge Instructions (Signed)
Take percocet every 4 hours for pain don't drive while taking. Don't take regular tylenol in addition to the percocet for risk of tylenol toxicity Motrin take every 8 hours on full stomach with lots of water for pain

## 2013-06-16 NOTE — Anesthesia Procedure Notes (Signed)
Procedure Name: Intubation Date/Time: 06/16/2013 12:09 PM Performed by: [employer], Sheron Nightingale Pre-anesthesia Checklist: Patient identified, Timeout performed, Emergency Drugs available, Suction available and Patient being monitored Patient Re-evaluated:Patient Re-evaluated prior to inductionOxygen Delivery Method: Circle system utilized Preoxygenation: Pre-oxygenation with 100% oxygen Intubation Type: IV induction Ventilation: Mask ventilation without difficulty Laryngoscope Size: Miller and 3 Grade View: Grade I Tube size: 7.0 mm Number of attempts: 1 Placement Confirmation: ETT inserted through vocal cords under direct vision,  breath sounds checked- equal and bilateral and positive ETCO2 Secured at: 19 cm Dental Injury: Teeth and Oropharynx as per pre-operative assessment

## 2013-06-16 NOTE — Discharge Summary (Signed)
Physician Discharge Summary  Patient ID: Stephanie Gregory MRN: [id number] DOB/AGE: 05/01/1965 48 y.o.  Admit date: 06/16/2013 Discharge date: 06/16/2013  Admission Diagnoses:  Discharge Diagnoses:  Left ovarian cyst   Discharged Condition: good  Hospital Course: Patient taken to the OR where a D&C hysteroscopy and laparoscopic left salpingoopherectomy Was performed without complication. Patient tolerated procedure well, was progressing well in PACU and met discharge criteria and was discharged home  Consults: None  Significant Diagnostic Studies: none3  Treatments: surgery: D&C hysteroscopy / Laparoscopic LSO  Discharge Exam: Blood pressure 103/85, pulse 95, temperature 98.2 F (36.8 C), temperature source Oral, resp. rate 20, last menstrual period 06/09/2013.  Indication:  Stephanie Gregory is a 71 y.o. year old female,presents for removal of her left fallopian tube and ovary and endometrial polypectomy which was found on pelvic ultrasound.  Patient with a clinical history of pelvic pain, and abnormal uterine bleeding. Risks, benefits, alternatives and indication was explained to patient prior to the procedure.   Findings:  Exam under anesthesia: Normal uterus, mobile anteverted, no palpable masses, Fullness palpable left adnexa  Vagina: normal ruggae, cervix grossly normal  Hysteroscopy: polypoid tissue in endometrium, both ostia visualized  Laparoscopy: Left pelvic adhesive disease, endometriotic disease , normal appearing uterus and right tube and ovary. Appendix visualized and normal in appearance  Bowel and liver appeared normal  Disposition: 01-Home      Medication List         oxyCODONE-acetaminophen 7.5-325 MG per tablet  Commonly known as:  PERCOCET  Take 1 tablet by mouth every 4 (four) hours as needed for pain.     topiramate 25 MG tablet  Commonly known as:  TOPAMAX  Take 25 mg by mouth 2 (two) times daily.     traZODone 100 MG tablet  Commonly known  as:  DESYREL  Take 3 tablets (300 mg total) by mouth at bedtime. For sleep.     venlafaxine XR 150 MG 24 hr capsule  Commonly known as:  EFFEXOR-XR  Take 2 capsules (300 mg total) by mouth daily. For depression and anxiety.           Follow-up Information   Follow up with [facility], Andrez Grime, MD. Schedule an appointment as soon as possible for a visit in 2 weeks.   Specialty:  Obstetrics and Gynecology   Contact information:   [address] [place] [address] [phone]       Signed: Caffie Damme 06/16/2013, 2:29 PM

## 2013-06-16 NOTE — Anesthesia Preprocedure Evaluation (Signed)
Anesthesia Evaluation  Patient identified by MRN, date of birth, ID band Patient awake    Reviewed: Allergy & Precautions, H&P , NPO status , Patient's Chart, lab work & pertinent test results, reviewed documented beta blocker date and time   History of Anesthesia Complications Negative for: history of anesthetic complications  Airway Mallampati: I TM Distance: >3 FB Neck ROM: limited   Comment: Pain in neck with extension Dental  (+) Teeth Intact, Edentulous Upper, Upper Dentures, Partial Lower   Pulmonary Current Smoker (1/2 ppd),  breath sounds clear to auscultation  Pulmonary exam normal       Cardiovascular Exercise Tolerance: Good negative cardio ROS  Rhythm:regular Rate:Normal     Neuro/Psych  Headaches (migraines - has one currently), PSYCHIATRIC DISORDERS Anxiety Depression    GI/Hepatic Neg liver ROS, GERD- (no meds)  ,  Endo/Other  negative endocrine ROS  Renal/GU negative Renal ROS  Female GU complaint Takes hydrocodone for periods - none in three days    Musculoskeletal   Abdominal   Peds  Hematology negative hematology ROS (+)   Anesthesia Other Findings   Reproductive/Obstetrics negative OB ROS                           Anesthesia Physical Anesthesia Plan  ASA: II  Anesthesia Plan: General ETT   Post-op Pain Management:    Induction:   Airway Management Planned:   Additional Equipment:   Intra-op Plan:   Post-operative Plan:   Informed Consent: I have reviewed the patients History and Physical, chart, labs and discussed the procedure including the risks, benefits and alternatives for the proposed anesthesia with the patient or authorized representative who has indicated his/her understanding and acceptance.   Dental Advisory Given  Plan Discussed with: CRNA and Surgeon  Anesthesia Plan Comments:         Anesthesia Quick Evaluation

## 2013-06-16 NOTE — OR Nursing (Signed)
Husband wants to be called by PACU RN when patient is ready to be discharged. Left phone # with PACU RN

## 2013-06-17 ENCOUNTER — Encounter (HOSPITAL_COMMUNITY): Payer: Self-pay | Admitting: Obstetrics & Gynecology

## 2013-06-17 MED FILL — Heparin Sodium (Porcine) Inj 5000 Unit/ML: INTRAMUSCULAR | Qty: 1 | Status: AC

## 2013-06-17 NOTE — Addendum Note (Signed)
Addendum created 06/17/13 1812 by Assunta Gambles, MD   Modules edited: Anesthesia Attestations

## 2013-09-08 NOTE — Patient Instructions (Addendum)
   Your procedure is scheduled on:  Tuesday, June 9  Enter through the [employer] [facility] at:  [place] up the phone at the desk and dial [phone] and inform us of your arrival.  Please call this number if you have any problems the morning of surgery: [phone]  Remember: Do not eat food after midnight: Monday  Do not drink clear liquids after: 8 AM Tuesday, day of surgery Take these medicines the morning of surgery with a SIP OF WATER: topamax, effexor  Do not wear jewelry, make-up, or FINGER nail polish No metal in your hair or on your body. Do not wear lotions, powders, perfumes.  You may wear deodorant.  Do not bring valuables to the hospital. Contacts, dentures or bridgework may not be worn into surgery.  Leave suitcase in the car. After Surgery it may be brought to your room. For patients being admitted to the hospital, checkout time is 11:00am the day of discharge.  Home with Stephanie Gregory

## 2013-09-09 ENCOUNTER — Other Ambulatory Visit: Payer: Self-pay | Admitting: Obstetrics & Gynecology

## 2013-09-10 ENCOUNTER — Encounter (HOSPITAL_COMMUNITY)
Admission: RE | Admit: 2013-09-10 | Discharge: 2013-09-10 | Disposition: A | Discharge status: Home / Self Care | Payer: BC Managed Care – PPO | Source: Ambulatory Visit | Attending: Obstetrics & Gynecology | Admitting: Obstetrics & Gynecology

## 2013-09-10 ENCOUNTER — Encounter (HOSPITAL_COMMUNITY): Payer: Self-pay

## 2013-09-10 DIAGNOSIS — Z01812 Encounter for preprocedural laboratory examination: Secondary | ICD-10-CM | POA: Insufficient documentation

## 2013-09-10 HISTORY — DX: Depression, unspecified: F32.A

## 2013-09-10 HISTORY — DX: Major depressive disorder, single episode, unspecified: F32.9

## 2013-09-10 LAB — ABO/RH: ABO/RH(D): O POS

## 2013-09-10 LAB — CBC
HCT: 39.6 % (ref 36.0–46.0)
Hemoglobin: 12.8 g/dL (ref 12.0–15.0)
MCH: 29.7 pg (ref 26.0–34.0)
MCHC: 32.3 g/dL (ref 30.0–36.0)
MCV: 91.9 fL (ref 78.0–100.0)
PLATELETS: 241 10*3/uL (ref 150–400)
RBC: 4.31 MIL/uL (ref 3.87–5.11)
RDW: 14.1 % (ref 11.5–15.5)
WBC: 7.3 10*3/uL (ref 4.0–10.5)

## 2013-09-15 ENCOUNTER — Encounter (HOSPITAL_COMMUNITY): Payer: BC Managed Care – PPO | Admitting: Anesthesiology

## 2013-09-15 ENCOUNTER — Encounter (HOSPITAL_COMMUNITY)
Admission: RE | Disposition: A | Discharge status: Home / Self Care | Payer: Self-pay | Source: Ambulatory Visit | Attending: Obstetrics & Gynecology

## 2013-09-15 ENCOUNTER — Encounter (HOSPITAL_COMMUNITY): Payer: Self-pay | Admitting: Anesthesiology

## 2013-09-15 ENCOUNTER — Ambulatory Visit (HOSPITAL_COMMUNITY): Payer: BC Managed Care – PPO | Admitting: Anesthesiology

## 2013-09-15 ENCOUNTER — Inpatient Hospital Stay (HOSPITAL_COMMUNITY)
Admission: RE | Admit: 2013-09-15 | Discharge: 2013-09-16 | DRG: 983 | Disposition: A | Discharge status: Home / Self Care | Payer: BC Managed Care – PPO | Source: Ambulatory Visit | Attending: Obstetrics & Gynecology | Admitting: Obstetrics & Gynecology

## 2013-09-15 DIAGNOSIS — Z90710 Acquired absence of both cervix and uterus: Secondary | ICD-10-CM

## 2013-09-15 DIAGNOSIS — N949 Unspecified condition associated with female genital organs and menstrual cycle: Secondary | ICD-10-CM | POA: Diagnosis present

## 2013-09-15 DIAGNOSIS — K219 Gastro-esophageal reflux disease without esophagitis: Secondary | ICD-10-CM | POA: Diagnosis present

## 2013-09-15 DIAGNOSIS — N831 Corpus luteum cyst of ovary, unspecified side: Secondary | ICD-10-CM | POA: Diagnosis present

## 2013-09-15 DIAGNOSIS — G8929 Other chronic pain: Principal | ICD-10-CM | POA: Diagnosis present

## 2013-09-15 DIAGNOSIS — N946 Dysmenorrhea, unspecified: Secondary | ICD-10-CM | POA: Diagnosis present

## 2013-09-15 DIAGNOSIS — Z8742 Personal history of other diseases of the female genital tract: Secondary | ICD-10-CM

## 2013-09-15 DIAGNOSIS — F341 Dysthymic disorder: Secondary | ICD-10-CM | POA: Diagnosis present

## 2013-09-15 DIAGNOSIS — IMO0002 Reserved for concepts with insufficient information to code with codable children: Secondary | ICD-10-CM | POA: Diagnosis present

## 2013-09-15 DIAGNOSIS — Z87410 Personal history of cervical dysplasia: Secondary | ICD-10-CM

## 2013-09-15 DIAGNOSIS — N938 Other specified abnormal uterine and vaginal bleeding: Secondary | ICD-10-CM | POA: Diagnosis present

## 2013-09-15 DIAGNOSIS — N8000 Endometriosis of the uterus, unspecified: Secondary | ICD-10-CM | POA: Diagnosis present

## 2013-09-15 DIAGNOSIS — Z90721 Acquired absence of ovaries, unilateral: Secondary | ICD-10-CM

## 2013-09-15 DIAGNOSIS — R21 Rash and other nonspecific skin eruption: Secondary | ICD-10-CM | POA: Diagnosis present

## 2013-09-15 DIAGNOSIS — F17200 Nicotine dependence, unspecified, uncomplicated: Secondary | ICD-10-CM | POA: Diagnosis present

## 2013-09-15 HISTORY — PX: LAPAROSCOPIC ASSISTED VAGINAL HYSTERECTOMY: SHX5398

## 2013-09-15 HISTORY — PX: CYSTOSCOPY: SHX5120

## 2013-09-15 LAB — PREGNANCY, URINE: Preg Test, Ur: NEGATIVE

## 2013-09-15 LAB — SURGICAL PCR SCREEN
MRSA, PCR: NEGATIVE
Staphylococcus aureus: NEGATIVE

## 2013-09-15 SURGERY — CYSTOSCOPY
Anesthesia: General | Site: Vagina | Laterality: Right

## 2013-09-15 MED ORDER — ONDANSETRON HCL 4 MG/2ML IJ SOLN
INTRAMUSCULAR | Status: AC
  Filled 2013-09-15: qty 2

## 2013-09-15 MED ORDER — ALPRAZOLAM 1 MG PO TABS
1.0000 mg | ORAL_TABLET | Freq: Once | ORAL | Status: AC
  Administered 2013-09-15: 1 mg via ORAL
  Filled 2013-09-15: qty 1

## 2013-09-15 MED ORDER — ROCURONIUM BROMIDE 100 MG/10ML IV SOLN
INTRAVENOUS | Status: DC | PRN
  Administered 2013-09-15 (×2): 10 mg via INTRAVENOUS
  Administered 2013-09-15: 5 mg via INTRAVENOUS
  Administered 2013-09-15 (×2): 10 mg via INTRAVENOUS
  Administered 2013-09-15: 35 mg via INTRAVENOUS
  Administered 2013-09-15: 10 mg via INTRAVENOUS

## 2013-09-15 MED ORDER — CEFAZOLIN SODIUM-DEXTROSE 2-3 GM-% IV SOLR
INTRAVENOUS | Status: AC
  Administered 2013-09-15: 2 g via INTRAVENOUS
  Filled 2013-09-15: qty 50

## 2013-09-15 MED ORDER — CEFAZOLIN SODIUM-DEXTROSE 2-3 GM-% IV SOLR: 2.0000 g | INTRAVENOUS | Status: DC

## 2013-09-15 MED ORDER — HYDROCORTISONE 1 % EX CREA
TOPICAL_CREAM | Freq: Three times a day (TID) | CUTANEOUS | Status: DC
  Administered 2013-09-15: 22:00:00 via TOPICAL
  Filled 2013-09-15: qty 28

## 2013-09-15 MED ORDER — LIDOCAINE HCL (CARDIAC) 20 MG/ML IV SOLN
INTRAVENOUS | Status: AC
  Filled 2013-09-15: qty 5

## 2013-09-15 MED ORDER — ONDANSETRON HCL 4 MG/2ML IJ SOLN
INTRAMUSCULAR | Status: DC | PRN
  Administered 2013-09-15: 4 mg via INTRAVENOUS

## 2013-09-15 MED ORDER — NALOXONE HCL 0.4 MG/ML IJ SOLN: 0.4000 mg | INTRAMUSCULAR | Status: DC | PRN

## 2013-09-15 MED ORDER — BUPIVACAINE HCL (PF) 0.25 % IJ SOLN
INTRAMUSCULAR | Status: AC
  Filled 2013-09-15: qty 30

## 2013-09-15 MED ORDER — HYDROMORPHONE HCL PF 1 MG/ML IJ SOLN
0.2500 mg | INTRAMUSCULAR | Status: DC | PRN
  Administered 2013-09-15 (×6): 0.5 mg via INTRAVENOUS

## 2013-09-15 MED ORDER — ZOLPIDEM TARTRATE 5 MG PO TABS: 5.0000 mg | ORAL_TABLET | Freq: Every evening | ORAL | Status: DC | PRN

## 2013-09-15 MED ORDER — HYDROMORPHONE HCL PF 1 MG/ML IJ SOLN
INTRAMUSCULAR | Status: AC
  Administered 2013-09-15: 0.5 mg via INTRAVENOUS
  Filled 2013-09-15: qty 1

## 2013-09-15 MED ORDER — HYDROMORPHONE HCL PF 1 MG/ML IJ SOLN
0.5000 mg | INTRAMUSCULAR | Status: DC | PRN
  Administered 2013-09-15: 0.5 mg via INTRAVENOUS

## 2013-09-15 MED ORDER — GLYCOPYRROLATE 0.2 MG/ML IJ SOLN
INTRAMUSCULAR | Status: AC
  Filled 2013-09-15: qty 2

## 2013-09-15 MED ORDER — MIDAZOLAM HCL 2 MG/2ML IJ SOLN
INTRAMUSCULAR | Status: AC
  Filled 2013-09-15: qty 2

## 2013-09-15 MED ORDER — DIPHENHYDRAMINE HCL 50 MG/ML IJ SOLN
50.0000 mg | Freq: Four times a day (QID) | INTRAMUSCULAR | Status: DC | PRN
  Administered 2013-09-16: 50 mg via INTRAVENOUS
  Filled 2013-09-15: qty 1

## 2013-09-15 MED ORDER — LACTATED RINGERS IV SOLN
INTRAVENOUS | Status: DC | PRN
  Administered 2013-09-15 (×3): via INTRAVENOUS

## 2013-09-15 MED ORDER — PANTOPRAZOLE SODIUM 40 MG IV SOLR
40.0000 mg | Freq: Every day | INTRAVENOUS | Status: DC
  Administered 2013-09-15: 40 mg via INTRAVENOUS
  Filled 2013-09-15: qty 40

## 2013-09-15 MED ORDER — DEXAMETHASONE SODIUM PHOSPHATE 10 MG/ML IJ SOLN
INTRAMUSCULAR | Status: DC | PRN
  Administered 2013-09-15: 10 mg via INTRAVENOUS

## 2013-09-15 MED ORDER — NEOSTIGMINE METHYLSULFATE 10 MG/10ML IV SOLN
INTRAVENOUS | Status: DC | PRN
  Administered 2013-09-15: 3 mg via INTRAVENOUS

## 2013-09-15 MED ORDER — PROPOFOL 10 MG/ML IV EMUL
INTRAVENOUS | Status: AC
  Filled 2013-09-15: qty 20

## 2013-09-15 MED ORDER — FENTANYL CITRATE 0.05 MG/ML IJ SOLN
INTRAMUSCULAR | Status: AC
  Filled 2013-09-15: qty 5

## 2013-09-15 MED ORDER — KETOROLAC TROMETHAMINE 30 MG/ML IJ SOLN
30.0000 mg | Freq: Four times a day (QID) | INTRAMUSCULAR | Status: DC
Start: 2013-09-15 — End: 2013-09-16

## 2013-09-15 MED ORDER — HYDROMORPHONE HCL PF 1 MG/ML IJ SOLN
INTRAMUSCULAR | Status: DC | PRN
  Administered 2013-09-15: 1 mg via INTRAVENOUS

## 2013-09-15 MED ORDER — 0.9 % SODIUM CHLORIDE (POUR BTL) OPTIME
TOPICAL | Status: DC | PRN
  Administered 2013-09-15: 2000 mL

## 2013-09-15 MED ORDER — ONDANSETRON HCL 4 MG PO TABS: 4.0000 mg | ORAL_TABLET | Freq: Four times a day (QID) | ORAL | Status: DC | PRN

## 2013-09-15 MED ORDER — GLYCOPYRROLATE 0.2 MG/ML IJ SOLN
INTRAMUSCULAR | Status: DC | PRN
  Administered 2013-09-15: 0.2 mg via INTRAVENOUS
  Administered 2013-09-15: 0.6 mg via INTRAVENOUS

## 2013-09-15 MED ORDER — METHYLENE BLUE 1 % INJ SOLN
INTRAMUSCULAR | Status: AC
  Filled 2013-09-15: qty 10

## 2013-09-15 MED ORDER — ROCURONIUM BROMIDE 100 MG/10ML IV SOLN
INTRAVENOUS | Status: AC
  Filled 2013-09-15: qty 1

## 2013-09-15 MED ORDER — HYDROMORPHONE HCL PF 1 MG/ML IJ SOLN
INTRAMUSCULAR | Status: AC
  Filled 2013-09-15: qty 1

## 2013-09-15 MED ORDER — LACTATED RINGERS IV SOLN: INTRAVENOUS | Status: DC

## 2013-09-15 MED ORDER — MUPIROCIN 2 % EX OINT
TOPICAL_OINTMENT | CUTANEOUS | Status: AC
  Filled 2013-09-15: qty 22

## 2013-09-15 MED ORDER — SIMETHICONE 80 MG PO CHEW: 80.0000 mg | CHEWABLE_TABLET | Freq: Four times a day (QID) | ORAL | Status: DC | PRN

## 2013-09-15 MED ORDER — MEPERIDINE HCL 25 MG/ML IJ SOLN: 6.2500 mg | INTRAMUSCULAR | Status: DC | PRN

## 2013-09-15 MED ORDER — LACTATED RINGERS IV SOLN
INTRAVENOUS | Status: DC
  Administered 2013-09-15: 23:00:00 via INTRAVENOUS

## 2013-09-15 MED ORDER — ALPRAZOLAM 0.5 MG PO TABS
1.0000 mg | ORAL_TABLET | Freq: Three times a day (TID) | ORAL | Status: DC
  Administered 2013-09-15 – 2013-09-16 (×2): 1 mg via ORAL
  Filled 2013-09-15 (×2): qty 2

## 2013-09-15 MED ORDER — KETOROLAC TROMETHAMINE 30 MG/ML IJ SOLN
30.0000 mg | Freq: Four times a day (QID) | INTRAMUSCULAR | Status: DC
  Administered 2013-09-15 – 2013-09-16 (×2): 30 mg via INTRAVENOUS
  Filled 2013-09-15 (×2): qty 1

## 2013-09-15 MED ORDER — FENTANYL CITRATE 0.05 MG/ML IJ SOLN
INTRAMUSCULAR | Status: AC
  Filled 2013-09-15: qty 2

## 2013-09-15 MED ORDER — KETOROLAC TROMETHAMINE 30 MG/ML IJ SOLN
INTRAMUSCULAR | Status: AC
  Administered 2013-09-15: 30 mg via INTRAVENOUS
  Filled 2013-09-15: qty 1

## 2013-09-15 MED ORDER — BUPIVACAINE HCL (PF) 0.25 % IJ SOLN
INTRAMUSCULAR | Status: DC | PRN
  Administered 2013-09-15: 10 mL

## 2013-09-15 MED ORDER — SODIUM CHLORIDE 0.9 % IJ SOLN: 9.0000 mL | INTRAMUSCULAR | Status: DC | PRN

## 2013-09-15 MED ORDER — DEXAMETHASONE SODIUM PHOSPHATE 10 MG/ML IJ SOLN
INTRAMUSCULAR | Status: AC
  Filled 2013-09-15: qty 1

## 2013-09-15 MED ORDER — FENTANYL CITRATE 0.05 MG/ML IJ SOLN
INTRAMUSCULAR | Status: DC | PRN
  Administered 2013-09-15: 100 ug via INTRAVENOUS
  Administered 2013-09-15: 50 ug via INTRAVENOUS
  Administered 2013-09-15 (×2): 100 ug via INTRAVENOUS

## 2013-09-15 MED ORDER — ONDANSETRON HCL 4 MG/2ML IJ SOLN: 4.0000 mg | Freq: Four times a day (QID) | INTRAMUSCULAR | Status: DC | PRN

## 2013-09-15 MED ORDER — PROPOFOL 10 MG/ML IV BOLUS
INTRAVENOUS | Status: DC | PRN
  Administered 2013-09-15: 170 mg via INTRAVENOUS
  Administered 2013-09-15: 30 mg via INTRAVENOUS

## 2013-09-15 MED ORDER — KETOROLAC TROMETHAMINE 30 MG/ML IJ SOLN
30.0000 mg | Freq: Once | INTRAMUSCULAR | Status: AC
  Administered 2013-09-15: 30 mg via INTRAVENOUS

## 2013-09-15 MED ORDER — LIDOCAINE HCL (CARDIAC) 20 MG/ML IV SOLN
INTRAVENOUS | Status: DC | PRN
  Administered 2013-09-15: 100 mg via INTRAVENOUS

## 2013-09-15 MED ORDER — MIDAZOLAM HCL 2 MG/2ML IJ SOLN
INTRAMUSCULAR | Status: DC | PRN
  Administered 2013-09-15: 2 mg via INTRAVENOUS

## 2013-09-15 MED ORDER — MENTHOL 3 MG MT LOZG: 1.0000 | LOZENGE | OROMUCOSAL | Status: DC | PRN

## 2013-09-15 MED ORDER — NEOSTIGMINE METHYLSULFATE 10 MG/10ML IV SOLN
INTRAVENOUS | Status: AC
  Filled 2013-09-15: qty 1

## 2013-09-15 MED ORDER — GLYCOPYRROLATE 0.2 MG/ML IJ SOLN
INTRAMUSCULAR | Status: AC
  Filled 2013-09-15: qty 3

## 2013-09-15 MED ORDER — METHYLENE BLUE 1 % INJ SOLN
INTRAMUSCULAR | Status: DC | PRN
  Administered 2013-09-15 (×2): 5 mL via INTRAVENOUS

## 2013-09-15 MED ORDER — IBUPROFEN 800 MG PO TABS: 800.0000 mg | ORAL_TABLET | Freq: Three times a day (TID) | ORAL | Status: DC | PRN

## 2013-09-15 MED ORDER — MORPHINE SULFATE (PF) 1 MG/ML IV SOLN
INTRAVENOUS | Status: DC
  Administered 2013-09-15: 4 mg via INTRAVENOUS
  Administered 2013-09-15: 19:00:00 via INTRAVENOUS
  Administered 2013-09-16: 13.5 mg via INTRAVENOUS
  Administered 2013-09-16 (×2): via INTRAVENOUS
  Filled 2013-09-15 (×3): qty 25

## 2013-09-15 MED ORDER — METOCLOPRAMIDE HCL 5 MG/ML IJ SOLN: 10.0000 mg | Freq: Once | INTRAMUSCULAR | Status: DC | PRN

## 2013-09-15 SURGICAL SUPPLY — 58 items
ADH SKN CLS APL DERMABOND .7 (GAUZE/BANDAGES/DRESSINGS) ×4
APL SKNCLS STERI-STRIP NONHPOA (GAUZE/BANDAGES/DRESSINGS)
BAG SPEC RTRVL LRG 6X4 10 (ENDOMECHANICALS)
BARRIER ADHS 3X4 INTERCEED (GAUZE/BANDAGES/DRESSINGS) ×2 IMPLANT
BENZOIN TINCTURE PRP APPL 2/3 (GAUZE/BANDAGES/DRESSINGS) ×2 IMPLANT
BRR ADH 4X3 ABS CNTRL BYND (GAUZE/BANDAGES/DRESSINGS)
CABLE HIGH FREQUENCY MONO STRZ (ELECTRODE) ×6 IMPLANT
CATH ROBINSON RED A/P 16FR (CATHETERS) ×2 IMPLANT
CHLORAPREP W/TINT 26ML (MISCELLANEOUS) ×6 IMPLANT
CLOSURE WOUND 1/4X4 (GAUZE/BANDAGES/DRESSINGS)
CLOTH BEACON ORANGE TIMEOUT ST (SAFETY) ×6 IMPLANT
COVER LIGHT HANDLE  1/PK (MISCELLANEOUS) ×6
COVER LIGHT HANDLE 1/PK (MISCELLANEOUS) ×10 IMPLANT
DERMABOND ADVANCED (GAUZE/BANDAGES/DRESSINGS) ×2
DERMABOND ADVANCED .7 DNX12 (GAUZE/BANDAGES/DRESSINGS) ×4 IMPLANT
DRESSING OPSITE X SMALL 2X3 (GAUZE/BANDAGES/DRESSINGS) ×4 IMPLANT
DRSG COVADERM PLUS 2X2 (GAUZE/BANDAGES/DRESSINGS) ×8 IMPLANT
DRSG OPSITE POSTOP 3X4 (GAUZE/BANDAGES/DRESSINGS) IMPLANT
DURAPREP 26ML APPLICATOR (WOUND CARE) ×2 IMPLANT
EVACUATOR SMOKE 8.L (FILTER) ×6 IMPLANT
GLOVE BIO SURGEON STRL SZ 6.5 (GLOVE) ×8 IMPLANT
GLOVE BIO SURGEON STRL SZ7 (GLOVE) ×18 IMPLANT
GLOVE BIO SURGEONS STRL SZ 6.5 (GLOVE) ×2
GLOVE BIOGEL PI IND STRL 7.0 (GLOVE) ×16 IMPLANT
GLOVE BIOGEL PI INDICATOR 7.0 (GLOVE) ×8
GOWN STRL REUS W/ TWL LRG LVL3 (GOWN DISPOSABLE) ×22 IMPLANT
GOWN STRL REUS W/TWL LRG LVL3 (GOWN DISPOSABLE) ×28 IMPLANT
LIGASURE 5MM LAPAROSCOPIC (INSTRUMENTS) ×6 IMPLANT
NEEDLE INSUFFLATION 120MM (ENDOMECHANICALS) ×2 IMPLANT
NS IRRIG 1000ML POUR BTL (IV SOLUTION) ×6 IMPLANT
PACK LAPAROSCOPY BASIN (CUSTOM PROCEDURE TRAY) ×2 IMPLANT
PACK LAVH (CUSTOM PROCEDURE TRAY) ×4 IMPLANT
POUCH SPECIMEN RETRIEVAL 10MM (ENDOMECHANICALS) IMPLANT
PROTECTOR NERVE ULNAR (MISCELLANEOUS) ×10 IMPLANT
SCISSORS LAP 5X35 DISP (ENDOMECHANICALS) ×6 IMPLANT
SET CYSTO W/LG BORE CLAMP LF (SET/KITS/TRAYS/PACK) IMPLANT
SET IRRIG TUBING LAPAROSCOPIC (IRRIGATION / IRRIGATOR) ×6 IMPLANT
SLEEVE Z-THREAD 5X100MM (TROCAR) ×2 IMPLANT
SOLUTION ELECTROLUBE (MISCELLANEOUS) ×4 IMPLANT
STRIP CLOSURE SKIN 1/4X4 (GAUZE/BANDAGES/DRESSINGS) ×2 IMPLANT
SUT MNCRL AB 4-0 PS2 18 (SUTURE) ×2 IMPLANT
SUT MON AB 4-0 PS1 27 (SUTURE) ×2 IMPLANT
SUT VIC AB 0 CT1 18XCR BRD8 (SUTURE) ×6 IMPLANT
SUT VIC AB 0 CT1 27 (SUTURE) ×42
SUT VIC AB 0 CT1 27XBRD ANBCTR (SUTURE) ×14 IMPLANT
SUT VIC AB 0 CT1 8-18 (SUTURE) ×12
SUT VIC AB 2-0 CT1 (SUTURE) ×8 IMPLANT
SUT VICRYL 0 UR6 27IN ABS (SUTURE) ×6 IMPLANT
SUT VICRYL 4-0 PS2 18IN ABS (SUTURE) ×6 IMPLANT
SYR 50ML LL SCALE MARK (SYRINGE) IMPLANT
SYR 5ML LL (SYRINGE) IMPLANT
SYRINGE 10CC LL (SYRINGE) ×4 IMPLANT
TOWEL OR 17X24 6PK STRL BLUE (TOWEL DISPOSABLE) ×18 IMPLANT
TRAY FOLEY CATH 14FR (SET/KITS/TRAYS/PACK) ×6 IMPLANT
TROCAR XCEL NON-BLD 11X100MML (ENDOMECHANICALS) ×8 IMPLANT
TROCAR XCEL NON-BLD 5MMX100MML (ENDOMECHANICALS) ×12 IMPLANT
WARMER LAPAROSCOPE (MISCELLANEOUS) ×6 IMPLANT
WATER STERILE IRR 1000ML POUR (IV SOLUTION) ×2 IMPLANT

## 2013-09-15 NOTE — Anesthesia Preprocedure Evaluation (Addendum)
Anesthesia Evaluation  Patient identified by MRN, date of birth, ID band Patient awake    Reviewed: Allergy & Precautions, H&P , NPO status , Patient's Chart, lab work & pertinent test results  Airway Mallampati: II TM Distance: >3 FB Neck ROM: Full    Dental  (+) Upper Dentures, Partial Lower   Pulmonary former smoker,  breath sounds clear to auscultation  Pulmonary exam normal       Cardiovascular negative cardio ROS  Rhythm:Regular Rate:Normal     Neuro/Psych  Headaches, PSYCHIATRIC DISORDERS Anxiety Depression    GI/Hepatic Neg liver ROS, GERD-  Medicated and Controlled,  Endo/Other  negative endocrine ROS  Renal/GU negative Renal ROS     Musculoskeletal   Abdominal   Peds  Hematology negative hematology ROS (+)   Anesthesia Other Findings Rash on back- erythematous Rash on right wrist- poison oak  Reproductive/Obstetrics Pelvic pain Dysmenorrhea                        Anesthesia Physical Anesthesia Plan  ASA: II  Anesthesia Plan: General   Post-op Pain Management:    Induction: Intravenous  Airway Management Planned: Oral ETT and Video Laryngoscope Planned  Additional Equipment:   Intra-op Plan:   Post-operative Plan: Extubation in OR  Informed Consent: I have reviewed the patients History and Physical, chart, labs and discussed the procedure including the risks, benefits and alternatives for the proposed anesthesia with the patient or authorized representative who has indicated his/her understanding and acceptance.   Dental advisory given  Plan Discussed with: CRNA, Anesthesiologist and Surgeon  Anesthesia Plan Comments:        Anesthesia Quick Evaluation

## 2013-09-15 NOTE — Brief Op Note (Signed)
09/15/2013  4:06 PM  PATIENT:  Stephanie Gregory  48 y.o. female  PRE-OPERATIVE DIAGNOSIS:  PELVIC PAIN / DYSMENORRHEA  POST-OPERATIVE DIAGNOSIS:  PELVIC PAIN / DYSMENORRHEA/ENDOMETRIOSIS  PROCEDURE:  Procedure(s): CYSTOSCOPY (N/A) LAPAROSCOPIC ASSISTED VAGINAL HYSTERECTOMY with right salpingo-oophorectomy (Right)  SURGEON:  Surgeon(s) and Role:    * Sanjuana Kava, MD - Primary    * Kendra H. Harrington Challenger, MD - Assisting  PHYSICIAN ASSISTANT:   ASSISTANTS: ROSS, KENDRA   ANESTHESIA:   local and general  EBL:  Total I/O In: 8938 [I.V.:2600] Out: 1150 [Urine:600; Blood:550]  BLOOD ADMINISTERED:none  DRAINS: Urinary Catheter (Foley)   LOCAL MEDICATIONS USED:  MARCAINE     SPECIMEN:  Source of Specimen:  Uterus, cervix, right tube and ovary  DISPOSITION OF SPECIMEN:  PATHOLOGY  COUNTS:  YES  TOURNIQUET:  * No tourniquets in log *  DICTATION: .Note written in EPIC  PLAN OF CARE: Admit to inpatient   PATIENT DISPOSITION:  PACU - hemodynamically stable.   Delay start of Pharmacological VTE agent (>24hrs) due to surgical blood loss or risk of bleeding: not applicable  [employer]

## 2013-09-15 NOTE — Transfer of Care (Signed)
Immediate Anesthesia Transfer of Care Note  Patient: Stephanie Gregory  Procedure(s) Performed: Procedure(s): CYSTOSCOPY (N/A) LAPAROSCOPIC ASSISTED VAGINAL HYSTERECTOMY with right salpingo-oophorectomy (Right)  Patient Location: PACU  Anesthesia Type:General  Level of Consciousness: awake, alert  and oriented  Airway & Oxygen Therapy: Patient Spontanous Breathing and Patient connected to nasal cannula oxygen  Post-op Assessment: Report given to PACU RN and Post -op Vital signs reviewed and stable  Post vital signs: Reviewed and stable  Complications: No apparent anesthesia complications

## 2013-09-15 NOTE — H&P (Signed)
ADALEA HANDLER is an 48 y.o. female presents for scheduled Laparoscopic total hysterectomy right salpingoopherectomy for chronic pelvic pain,  Known history of endometriosis.  Pertinent Gynecological History: Menses: flow is moderate Bleeding: dysfunctional uterine bleeding Contraception: none DES exposure: denies Blood transfusions: none Sexually transmitted diseases: no past history Previous GYN Procedures: LSO  Last mammogram: normal  Last pap: normal Date: 05/2013 OB History: G3, P3   Menstrual History: Menarche age: 59  No LMP recorded.    Past Medical History  Diagnosis Date  . Anxiety   . GERD (gastroesophageal reflux disease)   . Migraine   . Mental disorder     Depression / Anxiety  . Vaginal Pap smear, abnormal     CIN I - History  . SVD (spontaneous vaginal delivery)     x 3  . Depression   . DDD (degenerative disc disease)     has metal plate C4-C5 diskectomy    Past Surgical History  Procedure Laterality Date  . Neck surgery  12/1999    C4-C5 diskectomy  . Nasal sinus surgery  2000  . Dilatation & currettage/hysteroscopy with resectocope N/A 06/16/2013    Procedure: DILATATION & CURETTAGE/HYSTEROSCOPY WITH RESECTOCOPE/CERVICAL POLYPECTOMY;  Surgeon: Sanjuana Kava, MD;  Location: [facility];  Service: Gynecology;  Laterality: N/A;  . Laparoscopy N/A 06/16/2013    Procedure: LAPAROSCOPY OPERATIVE SALPINGO OOPHORECTOMY;  Surgeon: Sanjuana Kava, MD;  Location: [facility];  Service: Gynecology;  Laterality: N/A;  . Tubal ligation  10/2000  . Wisdom tooth extraction      No family history on file.  Social History:  reports that she quit smoking 8 days ago. Her smoking use included Cigarettes. She has a 15 pack-year smoking history. She has never used smokeless tobacco. She reports that she uses illicit drugs (Benzodiazepines) about 21 times per week. She reports that she does not drink alcohol.  Allergies: No Known Allergies  Prescriptions prior to admission  Medication  Sig Dispense Refill  . ALPRAZolam (XANAX) 1 MG tablet Take 1 mg by mouth 3 (three) times daily as needed for anxiety.      . mirtazapine (REMERON) 30 MG tablet Take 15 mg by mouth at bedtime.      . ondansetron (ZOFRAN-ODT) 8 MG disintegrating tablet Take 8 mg by mouth every 8 (eight) hours as needed for nausea or vomiting.      . phenylephrine (SUDAFED PE) 10 MG TABS tablet Take 10 mg by mouth daily.      . SUMAtriptan (IMITREX) 100 MG tablet Take 100 mg by mouth every 2 (two) hours as needed for migraine or headache. May repeat in 2 hours if headache persists or recurs.      . topiramate (TOPAMAX) 25 MG tablet Take 25 mg by mouth 2 (two) times daily.      . traZODone (DESYREL) 100 MG tablet Take 3 tablets (300 mg total) by mouth at bedtime. For sleep.      Marland Kitchen venlafaxine XR (EFFEXOR-XR) 75 MG 24 hr capsule Take 225 mg by mouth daily with breakfast.      . ibuprofen (ADVIL,MOTRIN) 800 MG tablet Take 800 mg by mouth every 8 (eight) hours as needed for moderate pain.      Marland Kitchen oxyCODONE-acetaminophen (PERCOCET) 7.5-325 MG per tablet Take 1 tablet by mouth every 4 (four) hours as needed for pain.        Review of Systems  Constitutional: Negative.   HENT: Negative for hearing loss.   Eyes: Negative for blurred vision.  Respiratory: Negative for cough.   Cardiovascular: Negative for chest pain and palpitations.  Gastrointestinal: Positive for abdominal pain.  Genitourinary: Negative for dysuria, urgency and frequency.  Skin: Negative.   Neurological: Negative for dizziness and headaches.    Blood pressure 111/68, temperature 98.2 F (36.8 C), temperature source Oral, resp. rate 18, SpO2 99.00%. Physical Exam  Constitutional: She appears well-developed and well-nourished.  HENT:  Head: Normocephalic and atraumatic.  Eyes: Pupils are equal, round, and reactive to light.  Neck: Normal range of motion.  Cardiovascular: Normal rate and regular rhythm.   Respiratory: Effort normal and breath  sounds normal.  GI: Soft. Bowel sounds are normal. There is tenderness. There is no rebound and no guarding.  Genitourinary: Vagina normal and uterus normal.  Adnexal tenderness  Musculoskeletal: Normal range of motion.    Results for orders placed during the hospital encounter of 09/15/13 (from the past 24 hour(s))  PREGNANCY, URINE     Status: None   Collection Time    09/15/13 11:00 AM      Result Value Ref Range   Preg Test, Ur NEGATIVE  NEGATIVE    No results found.  Assessment/Plan: 48 yo with endometriosis and chronic pelvic pain.  The patient has been given the risks, benefits, indications and alternatives of laparoscopic total hysterectomy with RSO. Risks include, but not limited to the risk of infection and bleeding that may require blood transfusion, pain and/or injury to associated pelvic organs to include bowel/bladder, ureters or other surrounding nerves, arteries and veins. We discussed the small possibility of having to convert to an open procedure to complete the hysterectomy. She understands that there is a possibility that this surgery will not alleviate her pelvic pain, and then I will refer her to a pain management specialist. She understands that she will go into menopause after removal of her right ovary. We discussed vasomotor symptomology, risk of bone loss etc.  Patient is a smoker and we discussed that I will not prescribe HRT if she continues to smoke due to the risk of VTE and stroke. Patient aware and agrees to quit smoking. All questions answered and her concerns were adequately addressed.    Ginnifer Creelman 09/15/2013, 11:59 AM

## 2013-09-15 NOTE — Anesthesia Postprocedure Evaluation (Signed)
  Anesthesia Post-op Note  Patient: Stephanie Gregory  Procedure(s) Performed: Procedure(s): CYSTOSCOPY (N/A) LAPAROSCOPIC ASSISTED VAGINAL HYSTERECTOMY with right salpingo-oophorectomy (Right) Patient is awake and responsive. Pain and nausea are reasonably well controlled. Vital signs are stable and clinically acceptable. Oxygen saturation is clinically acceptable. There are no apparent anesthetic complications at this time. Patient is ready for discharge.

## 2013-09-16 ENCOUNTER — Encounter (HOSPITAL_COMMUNITY): Payer: Self-pay | Admitting: Obstetrics & Gynecology

## 2013-09-16 LAB — CBC
HEMATOCRIT: 32.3 % — AB (ref 36.0–46.0)
HEMOGLOBIN: 10.4 g/dL — AB (ref 12.0–15.0)
MCH: 29.5 pg (ref 26.0–34.0)
MCHC: 32.2 g/dL (ref 30.0–36.0)
MCV: 91.5 fL (ref 78.0–100.0)
Platelets: 232 10*3/uL (ref 150–400)
RBC: 3.53 MIL/uL — ABNORMAL LOW (ref 3.87–5.11)
RDW: 14 % (ref 11.5–15.5)
WBC: 18.1 10*3/uL — ABNORMAL HIGH (ref 4.0–10.5)

## 2013-09-16 MED ORDER — PNEUMOCOCCAL VAC POLYVALENT 25 MCG/0.5ML IJ INJ
0.5000 mL | INJECTION | INTRAMUSCULAR | Status: AC
  Administered 2013-09-16: 0.5 mL via INTRAMUSCULAR

## 2013-09-16 MED ORDER — ALPRAZOLAM 1 MG PO TABS: 1.0000 mg | ORAL_TABLET | Freq: Three times a day (TID) | ORAL | Status: DC

## 2013-09-16 MED ORDER — ESTRADIOL 0.025 MG/24HR TD PTTW: 1.0000 | MEDICATED_PATCH | TRANSDERMAL | Status: DC

## 2013-09-16 MED ORDER — OXYCODONE-ACETAMINOPHEN 5-325 MG PO TABS: 2.0000 | ORAL_TABLET | ORAL | Status: DC | PRN

## 2013-09-16 MED ORDER — DOCUSATE SODIUM 100 MG PO CAPS: 100.0000 mg | ORAL_CAPSULE | Freq: Two times a day (BID) | ORAL | Status: DC

## 2013-09-16 MED ORDER — DIPHENHYDRAMINE HCL 25 MG PO CAPS
50.0000 mg | ORAL_CAPSULE | Freq: Four times a day (QID) | ORAL | Status: DC | PRN
Start: 2013-09-16 — End: 2013-09-16
  Administered 2013-09-16: 50 mg via ORAL
  Filled 2013-09-16: qty 2

## 2013-09-16 MED ORDER — OXYCODONE-ACETAMINOPHEN 5-325 MG PO TABS
1.0000 | ORAL_TABLET | ORAL | Status: DC | PRN
  Administered 2013-09-16: 2 via ORAL
  Filled 2013-09-16: qty 2

## 2013-09-16 NOTE — Anesthesia Postprocedure Evaluation (Signed)
  Anesthesia Post-op Note  Patient: Stephanie Gregory  Procedure(s) Performed: Procedure(s): CYSTOSCOPY (N/A) LAPAROSCOPIC ASSISTED VAGINAL HYSTERECTOMY with right salpingo-oophorectomy (Right)  Patient Location: [facility]  Anesthesia Type:General  Level of Consciousness: awake, alert  and oriented  Airway and Oxygen Therapy: Patient Spontanous Breathing  Post-op Pain: none  Post-op Assessment: Post-op Vital signs reviewed, Patient's Cardiovascular Status Stable, Respiratory Function Stable, No headache, No backache, No residual numbness and No residual motor weakness  Post-op Vital Signs: Reviewed and stable  Last Vitals:  Filed Vitals:   09/16/13 0542  BP: 126/75  Pulse: 56  Temp: 36.4 C  Resp: 18    Complications: No apparent anesthesia complications

## 2013-09-16 NOTE — Addendum Note (Signed)
Addendum created 09/16/13 0913 by Jonna Munro, CRNA   Modules edited: Notes Section   Notes Section:  File: [id number]

## 2013-09-16 NOTE — Progress Notes (Signed)
Discharge instructions reviewed with patient.  Patient states understanding of home care, medications, activity, signs/symptoms to report to MD and return MD office visit.  Significant other will assist with caring for patient at home.  No home equipment needed. Patient ambulated in stable condition with staff without incident.

## 2013-09-16 NOTE — Op Note (Signed)
OPERATIVE NOTE  Stephanie Gregory  DOB:    1965-05-10  MRN:    [id number]  CSN:    [id number]  Date of Surgery:  09/16/2013  Preoperative Diagnosis: Chronic pelvic pain, endometriosis  Postoperative Diagnosis: Same  Procedure:  Laparoscopy Assisted Vaginal Hysterectomy   Right Salpingoopherectomy  Surgeon:  Caffie Damme   Assistant:  Vanessa Kick  Anesthetic:  General  Disposition:  The patient presents with the above-mentioned diagnosis. She understands the indications for surgical procedure.  She also understands the alternative treatment options. She accepts the risk of, but not limited to, anesthetic complications, bleeding, infections, and possible damage to the surrounding organs.  Findings:  Exam under anesthesia: normal size uterus, good descent no palpable masses, adnexa: no palpable masses Laparoscopy: Multiple implants of endometriosis. Right ovary appears to have small endometriomas within cortex.  Uterus: grossly normal. Normal bowel, liver edge. Cystoscopy: normal bladder urothelium w/o injury post procedure Normal jets from both ureteral orifices.  Procedure:  The patient was taken to the operating room where a general anesthetic was given. The patient's  abdomen was prepped with ChloraPrep. The perineum and vagina were prepped with multiple layers of Betadine. A Foley catheter was placed in the bladder. An examination under anesthesia was performed. A Hulka tenaculum was placed inside the uterus. The patient was sterilely draped. An incision was made in the umbilicus with 11 blade scalpel. A 55mm Excel Visiport trocar was used with l69mm 0 degree laparoscope attached to perform direct entry into the patient's abdomen. Direct video visualization confirmed entry and pneumoperitoneum was obtained using approximately 3L CO2 gas.   Operative findings are mentioned above. A small incision was made and a 5 mm trocar was inserted into the abdominal cavity along  the right and left pelvis under direct visualization. There was no injury noted with placement of trocars. The Right IP was visualized and the ureters seen coursing well below operative field. The Right IP was cauterized with the Ligasure and then transected the Right fallopian tube was then transected. The Right ovary and tube was transected off the uterine corpus and placed into the posterior  Cul de sac. The IP pedicle was noted to be hemostatic. The right round ligament was identified. The round ligament was cauterized using the ligasure and transected. The Anterior leaf of the broad ligament was entered sharply using endoshears and the bladder flap was developed anteriorly. We then located the left round ligament. The left round ligament was cauterized and cut using the 49mm Ligasure. The Left tube and ovary was previously removed from a prior surgery. The bladder flap was developed further and the uterine arteries skeletonized bilaterally, cauterized and then transected. At this point we felt we were ready to proceed with the vaginal portion of the procedure. The patient was placed in a more lithotomy position. A weighted speculum was placed in the posterior vagina.  A circumferential incision was made around the cervix using the bovie. The vaginal mucosa was advanced anteriorly and posteriorly. The posterior cul de sac was entered sharply using [employer] and the duckbill weight speculum was placed. The Posterior peritoneum and vagina was tagged with 0-vicryl. The anterior cul-de-sac was then entered and the Deaver retractor placed. The uterosacral ligaments were then identified, clamped with Heaney clamps, cut with Mayo scissors and suture ligated with 0-vicryl. The tags were left long.  Alternating from right to left the remaining cardinal paracervical tissues, parametrial tissues,were clamped, cut, sutured, and tied securely with 0-vicryl. The uterus  was Doderlined and there were no remained ligaments  and  the uterus was removed from the operative field. Hemostasis was confirmed. The right ovary and tube was removed from the posterior cul de sac.The sutures attached to the uterosacral ligaments were brought out through the vaginal angles and then tied securely. A McCall culdoplasty suture was placed in the posterior cul-de-sac incorporating the uterosacral ligaments bilaterally and the posterior peritoneum. A final check was made for hemostasis and again hemostasis was confirmed. The vaginal cuff was closed in running, locked fashion using 0-vicryl. incorporating the anterior vaginal mucosa, the anterior peritoneum, posterior peritoneum, and the posterior vaginal mucosa. The McCall culdoplasty suture was tied securely and the apex of the vagina was noted to elevate into the midpelvis.  The operator then changed gown and gloves. The pneumoperitoneum was reestablished. The pelvis was inspected and hemostasis was adequate. We felt that we are ready to end the procedure. The 5 mm trochars were removed under direct visualization. The pneumoperitoneum was allowed to escape. The subumbilical trocar was removed. The subumbilical incision was closed using a deep suture of 0 Vicryl followed by skin closures of 3-0 Monocryl. The incisions were dressed with dermabond and bandages.  A cystoscopy was performed after the patient was given methylene blue. The was normal urothelium and normal jets from both ureteral orifices. The patient tolerated her procedure well. She was awakened from her anesthetic without difficulty and then transported to the recovery room in stable condition. Sponge, needle, and instrument counts were correct on 2 occasions. The estimated blood loss was 550 mL. The uterus  And right tube and ovary was sent to pathology.  Bebe Moncure STACIA

## 2013-09-16 NOTE — Progress Notes (Signed)
Ur chart review completed.  

## 2013-09-16 NOTE — Discharge Summary (Signed)
Physician Discharge Summary  Patient ID: MACKIE GOON MRN: [id number] DOB/AGE: 11/24/1965 48 y.o.  Admit date: 09/15/2013 Discharge date: 09/16/2013  Admission Diagnoses:  Discharge Diagnoses:  Active Problems:   S/P hysterectomy with oophorectomy   Discharged Condition: good  Hospital Course: Patient taken to operating room where LAVH RSO was performed without difficulty. She tolerated procedure well.  Post operative day #1 she was meeting all discharge criteria and was discharged home  Consults: None  Significant Diagnostic Studies: labs: CBC  Treatments: IV hydration, analgesia: Morphine and surgery: LAVH  RSO  Discharge Exam: Blood pressure 126/75, pulse 56, temperature 97.6 F (36.4 C), temperature source Oral, resp. rate 18, SpO2 100.00%.  General: alert, cooperative and no distress  Resp: clear to auscultation bilaterally  Cardio: regular rate and rhythm, S1, S2 normal, no murmur, click, rub or gallop  GI: soft, non-tender; bowel sounds normal; no masses, no organomegaly and incision: clean, dry and intact  Extremities: extremities normal, atraumatic, no cyanosis or edema and Homans sign is negative, no sign of DVT  Vaginal Bleeding: none  Disposition: 01-Home or Self Care  Discharge Instructions   Call MD for:  difficulty breathing, headache or visual disturbances    Complete by:  As directed      Call MD for:  extreme fatigue    Complete by:  As directed      Call MD for:  hives    Complete by:  As directed      Call MD for:  persistant dizziness or light-headedness    Complete by:  As directed      Call MD for:  persistant nausea and vomiting    Complete by:  As directed      Call MD for:  redness, tenderness, or signs of infection (pain, swelling, redness, odor or green/yellow discharge around incision site)    Complete by:  As directed      Call MD for:  severe uncontrolled pain    Complete by:  As directed      Call MD for:  temperature >100.4     Complete by:  As directed      Diet - low sodium heart healthy    Complete by:  As directed      Discharge instructions    Complete by:  As directed   Call office if you have any concerns or questions     Driving Restrictions    Complete by:  As directed   No driving 40-81 days or while taking percocet     Increase activity slowly    Complete by:  As directed      Lifting restrictions    Complete by:  As directed   No lifting anything heavier than 20 lbs     Remove dressing in 48 hours    Complete by:  As directed      Sexual Activity Restrictions    Complete by:  As directed   No intercourse for 4-6 weeks            Medication List    STOP taking these medications       oxyCODONE-acetaminophen 7.5-325 MG per tablet  Commonly known as:  PERCOCET  Replaced by:  oxyCODONE-acetaminophen 5-325 MG per tablet      TAKE these medications       ALPRAZolam 1 MG tablet  Commonly known as:  XANAX  Take 1 mg by mouth 3 (three) times daily as needed for anxiety.  ALPRAZolam 1 MG tablet  Commonly known as:  XANAX  Take 1 tablet (1 mg total) by mouth 3 (three) times daily.     docusate sodium 100 MG capsule  Commonly known as:  COLACE  Take 1 capsule (100 mg total) by mouth 2 (two) times daily.     estradiol 0.025 MG/24HR  Commonly known as:  VIVELLE-DOT  Place 1 patch onto the skin 2 (two) times a week.     ibuprofen 800 MG tablet  Commonly known as:  ADVIL,MOTRIN  Take 800 mg by mouth every 8 (eight) hours as needed for moderate pain.     mirtazapine 30 MG tablet  Commonly known as:  REMERON  Take 15 mg by mouth at bedtime.     ondansetron 8 MG disintegrating tablet  Commonly known as:  ZOFRAN-ODT  Take 8 mg by mouth every 8 (eight) hours as needed for nausea or vomiting.     oxyCODONE-acetaminophen 5-325 MG per tablet  Commonly known as:  PERCOCET/ROXICET  Take 2 tablets by mouth every 4 (four) hours as needed for severe pain.     phenylephrine 10 MG Tabs  tablet  Commonly known as:  SUDAFED PE  Take 10 mg by mouth daily.     SUMAtriptan 100 MG tablet  Commonly known as:  IMITREX  Take 100 mg by mouth every 2 (two) hours as needed for migraine or headache. May repeat in 2 hours if headache persists or recurs.     topiramate 25 MG tablet  Commonly known as:  TOPAMAX  Take 25 mg by mouth 2 (two) times daily.     traZODone 100 MG tablet  Commonly known as:  DESYREL  Take 3 tablets (300 mg total) by mouth at bedtime. For sleep.     venlafaxine XR 75 MG 24 hr capsule  Commonly known as:  EFFEXOR-XR  Take 225 mg by mouth daily with breakfast.         Signed: Bauer Ausborn, [place] 09/16/2013, 8:06 AM

## 2013-09-16 NOTE — Progress Notes (Signed)
1 Day Post-Op Procedure(s) (LRB): CYSTOSCOPY (N/A) LAPAROSCOPIC ASSISTED VAGINAL HYSTERECTOMY with right salpingo-oophorectomy (Right)  Subjective: Patient reports incisional pain, tolerating PO, + flatus, + BM and no problems voiding.    Objective: I have reviewed patient's vital signs, intake and output, medications and labs.  General: alert, cooperative and no distress Resp: clear to auscultation bilaterally Cardio: regular rate and rhythm, S1, S2 normal, no murmur, click, rub or gallop GI: soft, non-tender; bowel sounds normal; no masses,  no organomegaly and incision: clean, dry and intact Extremities: extremities normal, atraumatic, no cyanosis or edema and Homans sign is negative, no sign of DVT Vaginal Bleeding: none  Assessment: s/p Procedure(s): CYSTOSCOPY (N/A) LAPAROSCOPIC ASSISTED VAGINAL HYSTERECTOMY with right salpingo-oophorectomy (Right): progressing well and tolerating diet  Plan: Discontinue IV fluids Discharge home  LOS: 1 day    Machell Wirthlin, [place] 09/16/2013, 7:36 AM

## 2013-09-16 NOTE — Discharge Instructions (Signed)
Laparoscopically Assisted Vaginal Hysterectomy  A laparoscopically assisted vaginal hysterectomy (LAVH) is a surgical procedure to remove the uterus and cervix, and sometimes the ovaries and fallopian tubes. During an LAVH, some of the surgical removal is done through the vagina, and the rest is done through a few small surgical cuts (incisions) in the abdomen.  This procedure is usually considered in women when a vaginal hysterectomy is not an option. Your health care provider will discuss the risks and benefits of the different surgical techniques at your appointment. Generally, recovery time is faster and there are fewer complications after laparoscopic procedures than after open incisional procedures. LET [facility] CARE PROVIDER KNOW ABOUT:   Any allergies you have.  All medicines you are taking, including vitamins, herbs, eye drops, creams, and over-the-counter medicines.  Previous problems you or members of your family have had with the use of anesthetics.  Any blood disorders you have.  Previous surgeries you have had.  Medical conditions you have. RISKS AND COMPLICATIONS Generally, this is a safe procedure. However, as with any procedure, complications can occur. Possible complications include:  Allergies to medicines.  Difficulty breathing.  Bleeding.  Infection.  Damage to other structures near your uterus and cervix. BEFORE THE PROCEDURE  Ask your health care provider about changing or stopping your regular medicines.  Take certain medicines, such as a colon-emptying preparation, as directed.  Do not eat or drink anything for at least 8 hours before your surgery.  Stop smoking if you smoke. Stopping will improve your health after surgery.  Arrange for a ride home after surgery and for help at home during recovery. PROCEDURE   An IV tube will be put into one of your veins in order to give you fluids and medicines.  You will receive medicines to relax you and  medicines that make you sleep (general anesthetic).  You may have a flexible tube (catheter) put into your bladder to drain urine.  You may have a tube put through your nose or mouth that goes into your stomach (nasogastric tube). The nasogastric tube removes digestive fluids and prevents you from feeling nauseated and from vomiting.  Tight-fitting (compression) stockings will be placed on your legs to promote circulation.  Three to four small incisions will be made in your abdomen. An incision also will be made in your vagina. Probes and tools will be inserted into the small incisions. The uterus and cervix are removed (and possibly your ovaries and fallopian tubes) through your vagina as well as through the small incisions that were made in the abdomen.  Your vagina is then sewn back to normal. AFTER THE PROCEDURE  You may have a liquid diet temporarily. You will most likely return to, and tolerate, your usual diet the day after surgery.  You will be passing urine through a catheter. It will be removed the day after surgery.  Your temperature, breathing rate, heart rate, blood pressure, and oxygen level will be monitored regularly.  You will still wear compression stockings on your legs until you are able to move around.  You will use a special device or do breathing exercises to keep your lungs clear.  You will be encouraged to walk as soon as possible. Document Released: 03/15/2011 Document Revised: 11/26/2012 Document Reviewed: 10/09/2012 [facility] Patient Information 2014 [place], [place].

## 2013-09-17 LAB — TYPE AND SCREEN
ABO/RH(D): O POS
ANTIBODY SCREEN: NEGATIVE

## 2013-10-07 HISTORY — PX: ABDOMINAL HYSTERECTOMY: SHX81

## 2014-01-19 ENCOUNTER — Other Ambulatory Visit: Payer: Self-pay | Admitting: Orthopedic Surgery

## 2014-01-20 ENCOUNTER — Encounter (HOSPITAL_COMMUNITY): Payer: Self-pay | Admitting: Pharmacy Technician

## 2014-01-22 ENCOUNTER — Other Ambulatory Visit: Payer: Self-pay

## 2014-01-22 NOTE — Pre-Procedure Instructions (Signed)
Stephanie Gregory  01/22/2014   Your procedure is scheduled on:  Wednesday, Oct. 28th   Report to [facility] Admitting at  6:30 AM.   Call this number if you have problems the morning of surgery: [phone]   Remember:   Do not eat food or drink liquids after midnight Tuesday.   Take these medicines the morning of surgery with A SIP OF WATER: Hydrocodone, Methocarbamol, Effexor   Do not wear jewelry, make-up or nail polish.  Do not wear lotions, powders, or perfumes. You may NOT wear deodorant the day of surgery.  Do not shave underarms & legs 48 hours prior to surgery.    Do not bring valuables to the hospital.  [facility] is not responsible for any belongings or valuables.               Contacts, dentures or bridgework may not be worn into surgery.  Leave suitcase in the car. After surgery it may be brought to your room.  For patients admitted to the hospital, discharge time is determined by your treatment team.    Name and phone number of your driver:    Special Instructions: "Preparing for Surgery" instruction sheet.   Please read over the following fact sheets that you were given: Pain Booklet, Coughing and Deep Breathing, Blood Transfusion Information, MRSA Information and Surgical Site Infection Prevention

## 2014-01-25 ENCOUNTER — Encounter (HOSPITAL_COMMUNITY): Payer: Self-pay

## 2014-01-25 ENCOUNTER — Encounter (HOSPITAL_COMMUNITY)
Admission: RE | Admit: 2014-01-25 | Discharge: 2014-01-25 | Disposition: A | Discharge status: Home / Self Care | Payer: BC Managed Care – PPO | Source: Ambulatory Visit | Attending: Orthopedic Surgery | Admitting: Orthopedic Surgery

## 2014-01-25 ENCOUNTER — Ambulatory Visit (HOSPITAL_COMMUNITY)
Admission: RE | Admit: 2014-01-25 | Discharge: 2014-01-25 | Disposition: A | Discharge status: Home / Self Care | Payer: BC Managed Care – PPO | Source: Ambulatory Visit | Attending: Orthopedic Surgery | Admitting: Orthopedic Surgery

## 2014-01-25 DIAGNOSIS — Z01818 Encounter for other preprocedural examination: Secondary | ICD-10-CM | POA: Insufficient documentation

## 2014-01-25 DIAGNOSIS — M5410 Radiculopathy, site unspecified: Secondary | ICD-10-CM | POA: Diagnosis not present

## 2014-01-25 LAB — URINALYSIS, ROUTINE W REFLEX MICROSCOPIC
BILIRUBIN URINE: NEGATIVE
GLUCOSE, UA: NEGATIVE mg/dL
KETONES UR: NEGATIVE mg/dL
Leukocytes, UA: NEGATIVE
Nitrite: NEGATIVE
PROTEIN: NEGATIVE mg/dL
Specific Gravity, Urine: 1.004 — ABNORMAL LOW (ref 1.005–1.030)
Urobilinogen, UA: 0.2 mg/dL (ref 0.0–1.0)
pH: 5.5 (ref 5.0–8.0)

## 2014-01-25 LAB — COMPREHENSIVE METABOLIC PANEL
ALT: 9 U/L (ref 0–35)
AST: 15 U/L (ref 0–37)
Albumin: 3.5 g/dL (ref 3.5–5.2)
Alkaline Phosphatase: 50 U/L (ref 39–117)
Anion gap: 14 (ref 5–15)
BUN: 11 mg/dL (ref 6–23)
CALCIUM: 8.6 mg/dL (ref 8.4–10.5)
CO2: 21 meq/L (ref 19–32)
CREATININE: 0.83 mg/dL (ref 0.50–1.10)
Chloride: 103 mEq/L (ref 96–112)
GFR calc Af Amer: >90 mL/min (ref >90)
GFR, EST NON AFRICAN AMERICAN: 83 mL/min — AB (ref >90)
Glucose, Bld: 92 mg/dL (ref 70–99)
Potassium: 3.7 mEq/L (ref 3.7–5.3)
SODIUM: 138 meq/L (ref 137–147)
TOTAL PROTEIN: 6.6 g/dL (ref 6.0–8.3)
Total Bilirubin: <0.2 mg/dL — ABNORMAL LOW (ref 0.3–1.2)

## 2014-01-25 LAB — CBC WITH DIFFERENTIAL/PLATELET
Basophils Absolute: 0 10*3/uL (ref 0.0–0.1)
Basophils Relative: 1 % (ref 0–1)
EOS PCT: 0 % (ref 0–5)
Eosinophils Absolute: 0 10*3/uL (ref 0.0–0.7)
HEMATOCRIT: 37.5 % (ref 36.0–46.0)
Hemoglobin: 12.6 g/dL (ref 12.0–15.0)
LYMPHS PCT: 35 % (ref 12–46)
Lymphs Abs: 2.3 10*3/uL (ref 0.7–4.0)
MCH: 29.1 pg (ref 26.0–34.0)
MCHC: 33.6 g/dL (ref 30.0–36.0)
MCV: 86.6 fL (ref 78.0–100.0)
MONO ABS: 0.5 10*3/uL (ref 0.1–1.0)
Monocytes Relative: 7 % (ref 3–12)
Neutro Abs: 3.7 10*3/uL (ref 1.7–7.7)
Neutrophils Relative %: 57 % (ref 43–77)
PLATELETS: 215 10*3/uL (ref 150–400)
RBC: 4.33 MIL/uL (ref 3.87–5.11)
RDW: 15 % (ref 11.5–15.5)
WBC: 6.5 10*3/uL (ref 4.0–10.5)

## 2014-01-25 LAB — HCG, SERUM, QUALITATIVE: Preg, Serum: NEGATIVE

## 2014-01-25 LAB — URINE MICROSCOPIC-ADD ON

## 2014-01-25 LAB — TYPE AND SCREEN
ABO/RH(D): O POS
Antibody Screen: NEGATIVE

## 2014-01-25 LAB — ABO/RH: ABO/RH(D): O POS

## 2014-01-25 LAB — SURGICAL PCR SCREEN
MRSA, PCR: POSITIVE — AB
Staphylococcus aureus: POSITIVE — AB

## 2014-01-25 LAB — APTT: aPTT: 31 seconds (ref 24–37)

## 2014-01-25 LAB — PROTIME-INR
INR: 0.99 (ref 0.00–1.49)
PROTHROMBIN TIME: 13.2 s (ref 11.6–15.2)

## 2014-01-25 NOTE — Progress Notes (Addendum)
Patient denies any recreational drug use. She would like her dentures to go to recovery prior to her going back to the OR.  Conscientious about not having teeth.

## 2014-01-26 ENCOUNTER — Ambulatory Visit: Payer: BC Managed Care – PPO | Admitting: Diagnostic Neuroimaging

## 2014-02-02 MED ORDER — CEFAZOLIN SODIUM-DEXTROSE 2-3 GM-% IV SOLR
2.0000 g | INTRAVENOUS | Status: AC
  Administered 2014-02-03: 2 g via INTRAVENOUS

## 2014-02-03 ENCOUNTER — Observation Stay (HOSPITAL_COMMUNITY)
Admission: RE | Admit: 2014-02-03 | Discharge: 2014-02-04 | Disposition: A | Discharge status: Home / Self Care | Payer: BC Managed Care – PPO | Source: Ambulatory Visit | Attending: Orthopedic Surgery | Admitting: Orthopedic Surgery

## 2014-02-03 ENCOUNTER — Encounter (HOSPITAL_COMMUNITY)
Admission: RE | Disposition: A | Discharge status: Home / Self Care | Payer: Self-pay | Source: Ambulatory Visit | Attending: Orthopedic Surgery

## 2014-02-03 ENCOUNTER — Encounter (HOSPITAL_COMMUNITY): Payer: BC Managed Care – PPO | Admitting: Anesthesiology

## 2014-02-03 ENCOUNTER — Ambulatory Visit (HOSPITAL_COMMUNITY): Payer: BC Managed Care – PPO | Admitting: Anesthesiology

## 2014-02-03 ENCOUNTER — Encounter (HOSPITAL_COMMUNITY): Payer: Self-pay | Admitting: *Deleted

## 2014-02-03 ENCOUNTER — Ambulatory Visit (HOSPITAL_COMMUNITY): Payer: BC Managed Care – PPO

## 2014-02-03 DIAGNOSIS — M5032 Other cervical disc degeneration, mid-cervical region: Secondary | ICD-10-CM | POA: Diagnosis not present

## 2014-02-03 DIAGNOSIS — Z419 Encounter for procedure for purposes other than remedying health state, unspecified: Secondary | ICD-10-CM

## 2014-02-03 DIAGNOSIS — Z87891 Personal history of nicotine dependence: Secondary | ICD-10-CM | POA: Insufficient documentation

## 2014-02-03 DIAGNOSIS — M5030 Other cervical disc degeneration, unspecified cervical region: Secondary | ICD-10-CM | POA: Diagnosis present

## 2014-02-03 DIAGNOSIS — Z79891 Long term (current) use of opiate analgesic: Secondary | ICD-10-CM | POA: Insufficient documentation

## 2014-02-03 DIAGNOSIS — K219 Gastro-esophageal reflux disease without esophagitis: Secondary | ICD-10-CM | POA: Insufficient documentation

## 2014-02-03 DIAGNOSIS — Z981 Arthrodesis status: Secondary | ICD-10-CM | POA: Insufficient documentation

## 2014-02-03 DIAGNOSIS — G43909 Migraine, unspecified, not intractable, without status migrainosus: Secondary | ICD-10-CM | POA: Diagnosis not present

## 2014-02-03 DIAGNOSIS — F419 Anxiety disorder, unspecified: Secondary | ICD-10-CM | POA: Diagnosis not present

## 2014-02-03 DIAGNOSIS — F329 Major depressive disorder, single episode, unspecified: Secondary | ICD-10-CM | POA: Diagnosis not present

## 2014-02-03 DIAGNOSIS — M4802 Spinal stenosis, cervical region: Secondary | ICD-10-CM | POA: Diagnosis not present

## 2014-02-03 HISTORY — PX: HARDWARE REMOVAL: SHX979

## 2014-02-03 HISTORY — PX: ANTERIOR CERVICAL DECOMP/DISCECTOMY FUSION: SHX1161

## 2014-02-03 SURGERY — ANTERIOR CERVICAL DECOMPRESSION/DISCECTOMY FUSION 2 LEVELS
Anesthesia: General | Site: Spine Cervical

## 2014-02-03 MED ORDER — DOCUSATE SODIUM 100 MG PO CAPS
100.0000 mg | ORAL_CAPSULE | Freq: Two times a day (BID) | ORAL | Status: DC
  Administered 2014-02-03: 100 mg via ORAL
  Filled 2014-02-03 (×3): qty 1

## 2014-02-03 MED ORDER — TOPIRAMATE 25 MG PO TABS
25.0000 mg | ORAL_TABLET | Freq: Two times a day (BID) | ORAL | Status: DC
  Administered 2014-02-03: 25 mg via ORAL
  Filled 2014-02-03 (×3): qty 1

## 2014-02-03 MED ORDER — ONDANSETRON HCL 4 MG/2ML IJ SOLN
INTRAMUSCULAR | Status: AC
  Filled 2014-02-03: qty 2

## 2014-02-03 MED ORDER — OXYCODONE-ACETAMINOPHEN 5-325 MG PO TABS
1.0000 | ORAL_TABLET | ORAL | Status: DC | PRN
  Administered 2014-02-03 – 2014-02-04 (×5): 2 via ORAL
  Filled 2014-02-03 (×4): qty 2

## 2014-02-03 MED ORDER — LIDOCAINE HCL (CARDIAC) 20 MG/ML IV SOLN
INTRAVENOUS | Status: AC
  Filled 2014-02-03: qty 5

## 2014-02-03 MED ORDER — ROCURONIUM BROMIDE 100 MG/10ML IV SOLN
INTRAVENOUS | Status: DC | PRN
  Administered 2014-02-03: 50 mg via INTRAVENOUS

## 2014-02-03 MED ORDER — POVIDONE-IODINE 7.5 % EX SOLN
Freq: Once | CUTANEOUS | Status: DC
  Filled 2014-02-03: qty 118

## 2014-02-03 MED ORDER — PROPOFOL 10 MG/ML IV BOLUS
INTRAVENOUS | Status: AC
  Filled 2014-02-03: qty 20

## 2014-02-03 MED ORDER — SENNOSIDES-DOCUSATE SODIUM 8.6-50 MG PO TABS
1.0000 | ORAL_TABLET | Freq: Every evening | ORAL | Status: DC | PRN
  Filled 2014-02-03: qty 1

## 2014-02-03 MED ORDER — MIDAZOLAM HCL 5 MG/5ML IJ SOLN
INTRAMUSCULAR | Status: DC | PRN
  Administered 2014-02-03: 2 mg via INTRAVENOUS

## 2014-02-03 MED ORDER — SUCCINYLCHOLINE CHLORIDE 20 MG/ML IJ SOLN
INTRAMUSCULAR | Status: AC
  Filled 2014-02-03: qty 1

## 2014-02-03 MED ORDER — PHENOL 1.4 % MT LIQD: 1.0000 | OROMUCOSAL | Status: DC | PRN

## 2014-02-03 MED ORDER — PROPOFOL 10 MG/ML IV BOLUS
INTRAVENOUS | Status: DC | PRN
  Administered 2014-02-03: 160 mg via INTRAVENOUS
  Administered 2014-02-03: 10 mg via INTRAVENOUS

## 2014-02-03 MED ORDER — GLYCOPYRROLATE 0.2 MG/ML IJ SOLN
INTRAMUSCULAR | Status: AC
  Filled 2014-02-03: qty 1

## 2014-02-03 MED ORDER — DEXAMETHASONE SODIUM PHOSPHATE 4 MG/ML IJ SOLN
INTRAMUSCULAR | Status: DC | PRN
  Administered 2014-02-03: 8 mg via INTRAVENOUS

## 2014-02-03 MED ORDER — ONDANSETRON HCL 4 MG/2ML IJ SOLN: 4.0000 mg | INTRAMUSCULAR | Status: DC | PRN

## 2014-02-03 MED ORDER — MENTHOL 3 MG MT LOZG: 1.0000 | LOZENGE | OROMUCOSAL | Status: DC | PRN

## 2014-02-03 MED ORDER — THROMBIN 20000 UNITS EX SOLR
CUTANEOUS | Status: DC | PRN
  Administered 2014-02-03: 09:00:00 via TOPICAL

## 2014-02-03 MED ORDER — DOCUSATE SODIUM 100 MG PO CAPS: 200.0000 mg | ORAL_CAPSULE | Freq: Every day | ORAL | Status: DC

## 2014-02-03 MED ORDER — LACTATED RINGERS IV SOLN
INTRAVENOUS | Status: DC | PRN
  Administered 2014-02-03 (×2): via INTRAVENOUS

## 2014-02-03 MED ORDER — SODIUM CHLORIDE 0.9 % IJ SOLN
3.0000 mL | Freq: Two times a day (BID) | INTRAMUSCULAR | Status: DC
  Administered 2014-02-03 (×2): 3 mL via INTRAVENOUS

## 2014-02-03 MED ORDER — SODIUM CHLORIDE 0.9 % IJ SOLN
INTRAMUSCULAR | Status: AC
  Filled 2014-02-03: qty 10

## 2014-02-03 MED ORDER — ONDANSETRON 8 MG PO TBDP
8.0000 mg | ORAL_TABLET | Freq: Three times a day (TID) | ORAL | Status: DC | PRN
  Filled 2014-02-03: qty 1

## 2014-02-03 MED ORDER — CEFAZOLIN SODIUM-DEXTROSE 2-3 GM-% IV SOLR
INTRAVENOUS | Status: AC
  Filled 2014-02-03: qty 50

## 2014-02-03 MED ORDER — DEXAMETHASONE SODIUM PHOSPHATE 4 MG/ML IJ SOLN
INTRAMUSCULAR | Status: AC
  Filled 2014-02-03: qty 2

## 2014-02-03 MED ORDER — TRAZODONE HCL 150 MG PO TABS
300.0000 mg | ORAL_TABLET | Freq: Every day | ORAL | Status: DC
  Administered 2014-02-03: 300 mg via ORAL
  Filled 2014-02-03 (×2): qty 2

## 2014-02-03 MED ORDER — FLEET ENEMA 7-19 GM/118ML RE ENEM: 1.0000 | ENEMA | Freq: Once | RECTAL | Status: AC | PRN

## 2014-02-03 MED ORDER — THROMBIN 20000 UNITS EX SOLR
CUTANEOUS | Status: AC
  Filled 2014-02-03: qty 40000

## 2014-02-03 MED ORDER — HYDROMORPHONE HCL 1 MG/ML IJ SOLN
INTRAMUSCULAR | Status: AC
  Administered 2014-02-03: 0.5 mg via INTRAVENOUS
  Filled 2014-02-03: qty 2

## 2014-02-03 MED ORDER — RIZATRIPTAN BENZOATE 10 MG PO TBDP: 10.0000 mg | ORAL_TABLET | ORAL | Status: DC | PRN

## 2014-02-03 MED ORDER — GLYCOPYRROLATE 0.2 MG/ML IJ SOLN
INTRAMUSCULAR | Status: DC | PRN
  Administered 2014-02-03: 0.4 mg via INTRAVENOUS

## 2014-02-03 MED ORDER — NEOSTIGMINE METHYLSULFATE 10 MG/10ML IV SOLN
INTRAVENOUS | Status: DC | PRN
  Administered 2014-02-03: 3 mg via INTRAVENOUS

## 2014-02-03 MED ORDER — MIRTAZAPINE 15 MG PO TABS
15.0000 mg | ORAL_TABLET | Freq: Every day | ORAL | Status: DC
  Administered 2014-02-03: 15 mg via ORAL
  Filled 2014-02-03 (×2): qty 1

## 2014-02-03 MED ORDER — NICOTINE 21 MG/24HR TD PT24
21.0000 mg | MEDICATED_PATCH | Freq: Every day | TRANSDERMAL | Status: DC
  Administered 2014-02-03: 21 mg via TRANSDERMAL
  Filled 2014-02-03 (×2): qty 1

## 2014-02-03 MED ORDER — BUPIVACAINE-EPINEPHRINE 0.25% -1:200000 IJ SOLN
INTRAMUSCULAR | Status: DC | PRN
  Administered 2014-02-03: 2 mL

## 2014-02-03 MED ORDER — FENTANYL CITRATE 0.05 MG/ML IJ SOLN
INTRAMUSCULAR | Status: DC | PRN
  Administered 2014-02-03: 50 ug via INTRAVENOUS
  Administered 2014-02-03: 100 ug via INTRAVENOUS
  Administered 2014-02-03 (×7): 50 ug via INTRAVENOUS

## 2014-02-03 MED ORDER — EPHEDRINE SULFATE 50 MG/ML IJ SOLN
INTRAMUSCULAR | Status: AC
  Filled 2014-02-03: qty 1

## 2014-02-03 MED ORDER — FENTANYL CITRATE 0.05 MG/ML IJ SOLN
INTRAMUSCULAR | Status: AC
  Filled 2014-02-03: qty 5

## 2014-02-03 MED ORDER — 0.9 % SODIUM CHLORIDE (POUR BTL) OPTIME
TOPICAL | Status: DC | PRN
  Administered 2014-02-03: 1000 mL

## 2014-02-03 MED ORDER — VENLAFAXINE HCL ER 75 MG PO CP24
225.0000 mg | ORAL_CAPSULE | Freq: Every day | ORAL | Status: DC
  Filled 2014-02-03 (×2): qty 1

## 2014-02-03 MED ORDER — MORPHINE SULFATE 2 MG/ML IJ SOLN
1.0000 mg | INTRAMUSCULAR | Status: DC | PRN
  Administered 2014-02-03 (×2): 2 mg via INTRAVENOUS
  Administered 2014-02-03: 4 mg via INTRAVENOUS
  Filled 2014-02-03: qty 1
  Filled 2014-02-03: qty 2
  Filled 2014-02-03: qty 1

## 2014-02-03 MED ORDER — OXYCODONE-ACETAMINOPHEN 5-325 MG PO TABS
ORAL_TABLET | ORAL | Status: AC
  Administered 2014-02-03: 2 via ORAL
  Filled 2014-02-03: qty 2

## 2014-02-03 MED ORDER — BISACODYL 5 MG PO TBEC
5.0000 mg | DELAYED_RELEASE_TABLET | Freq: Every day | ORAL | Status: DC | PRN
  Filled 2014-02-03: qty 1

## 2014-02-03 MED ORDER — SUMATRIPTAN SUCCINATE 100 MG PO TABS
100.0000 mg | ORAL_TABLET | ORAL | Status: DC | PRN
  Administered 2014-02-03: 100 mg via ORAL
  Filled 2014-02-03: qty 1

## 2014-02-03 MED ORDER — ZOLPIDEM TARTRATE 5 MG PO TABS: 5.0000 mg | ORAL_TABLET | Freq: Every evening | ORAL | Status: DC | PRN

## 2014-02-03 MED ORDER — DIAZEPAM 5 MG PO TABS
ORAL_TABLET | ORAL | Status: AC
  Administered 2014-02-03: 5 mg via ORAL
  Filled 2014-02-03: qty 1

## 2014-02-03 MED ORDER — HYDROMORPHONE HCL 1 MG/ML IJ SOLN
0.2500 mg | INTRAMUSCULAR | Status: DC | PRN
  Administered 2014-02-03 (×4): 0.5 mg via INTRAVENOUS

## 2014-02-03 MED ORDER — MIDAZOLAM HCL 2 MG/2ML IJ SOLN
INTRAMUSCULAR | Status: AC
  Filled 2014-02-03: qty 2

## 2014-02-03 MED ORDER — CEFAZOLIN SODIUM 1-5 GM-% IV SOLN
1.0000 g | Freq: Three times a day (TID) | INTRAVENOUS | Status: AC
  Administered 2014-02-03 (×2): 1 g via INTRAVENOUS
  Filled 2014-02-03 (×2): qty 50

## 2014-02-03 MED ORDER — DIAZEPAM 5 MG PO TABS
5.0000 mg | ORAL_TABLET | Freq: Four times a day (QID) | ORAL | Status: DC | PRN
  Administered 2014-02-03 – 2014-02-04 (×3): 5 mg via ORAL
  Filled 2014-02-03 (×2): qty 1

## 2014-02-03 MED ORDER — ROCURONIUM BROMIDE 50 MG/5ML IV SOLN
INTRAVENOUS | Status: AC
  Filled 2014-02-03: qty 1

## 2014-02-03 MED ORDER — PHENYLEPHRINE 40 MCG/ML (10ML) SYRINGE FOR IV PUSH (FOR BLOOD PRESSURE SUPPORT)
PREFILLED_SYRINGE | INTRAVENOUS | Status: AC
  Filled 2014-02-03: qty 10

## 2014-02-03 MED ORDER — ACETAMINOPHEN 650 MG RE SUPP: 650.0000 mg | RECTAL | Status: DC | PRN

## 2014-02-03 MED ORDER — SODIUM CHLORIDE 0.9 % IJ SOLN: 3.0000 mL | INTRAMUSCULAR | Status: DC | PRN

## 2014-02-03 MED ORDER — ALUM & MAG HYDROXIDE-SIMETH 200-200-20 MG/5ML PO SUSP: 30.0000 mL | Freq: Four times a day (QID) | ORAL | Status: DC | PRN

## 2014-02-03 MED ORDER — CALCIUM POLYCARBOPHIL 625 MG PO TABS
625.0000 mg | ORAL_TABLET | Freq: Every day | ORAL | Status: DC
  Administered 2014-02-03: 625 mg via ORAL
  Filled 2014-02-03 (×2): qty 1

## 2014-02-03 MED ORDER — LIDOCAINE HCL (CARDIAC) 20 MG/ML IV SOLN
INTRAVENOUS | Status: DC | PRN
  Administered 2014-02-03: 100 mg via INTRAVENOUS
  Administered 2014-02-03: 60 mg via INTRAVENOUS

## 2014-02-03 MED ORDER — BUPIVACAINE-EPINEPHRINE (PF) 0.25% -1:200000 IJ SOLN
INTRAMUSCULAR | Status: AC
  Filled 2014-02-03: qty 30

## 2014-02-03 MED ORDER — ACETAMINOPHEN 325 MG PO TABS: 650.0000 mg | ORAL_TABLET | ORAL | Status: DC | PRN

## 2014-02-03 MED ORDER — ONDANSETRON HCL 4 MG/2ML IJ SOLN
INTRAMUSCULAR | Status: DC | PRN
  Administered 2014-02-03: 4 mg via INTRAVENOUS

## 2014-02-03 SURGICAL SUPPLY — 75 items
APL SKNCLS STERI-STRIP NONHPOA (GAUZE/BANDAGES/DRESSINGS) ×1
BENZOIN TINCTURE PRP APPL 2/3 (GAUZE/BANDAGES/DRESSINGS) ×2 IMPLANT
BIT DRILL NEURO 2X3.1 SFT TUCH (MISCELLANEOUS) ×1 IMPLANT
BIT DRILL SKYLINE 12MM (BIT) IMPLANT
BLADE SURG 15 STRL LF DISP TIS (BLADE) ×1 IMPLANT
BLADE SURG 15 STRL SS (BLADE) ×2
BLADE SURG ROTATE 9660 (MISCELLANEOUS) ×1 IMPLANT
BUR MATCHSTICK NEURO 3.0 LAGG (BURR) IMPLANT
CARTRIDGE OIL MAESTRO DRILL (MISCELLANEOUS) ×1 IMPLANT
COLLAR CERV LO CONTOUR FIRM DE (SOFTGOODS) IMPLANT
CORDS BIPOLAR (ELECTRODE) ×2 IMPLANT
COVER SURGICAL LIGHT HANDLE (MISCELLANEOUS) ×2 IMPLANT
CRADLE DONUT ADULT HEAD (MISCELLANEOUS) ×2 IMPLANT
DEVICE ENDSKLTN CRVCL 5MM-0SM (Orthopedic Implant) IMPLANT
DIFFUSER DRILL AIR PNEUMATIC (MISCELLANEOUS) ×2 IMPLANT
DRAIN JACKSON RD 7FR 3/32 (WOUND CARE) IMPLANT
DRAPE C-ARM 42X72 X-RAY (DRAPES) ×2 IMPLANT
DRAPE POUCH INSTRU U-SHP 10X18 (DRAPES) ×2 IMPLANT
DRAPE SURG 17X23 STRL (DRAPES) ×7 IMPLANT
DRILL BIT SKYLINE 12MM (BIT) ×2
DRILL NEURO 2X3.1 SOFT TOUCH (MISCELLANEOUS) ×2
DURAPREP 26ML APPLICATOR (WOUND CARE) ×2 IMPLANT
ELECT COATED BLADE 2.86 ST (ELECTRODE) ×2 IMPLANT
ELECT REM PT RETURN 9FT ADLT (ELECTROSURGICAL) ×2
ELECTRODE REM PT RTRN 9FT ADLT (ELECTROSURGICAL) ×1 IMPLANT
ENDOSKELETON CERVICAL 5MM-0SM (Orthopedic Implant) ×4 IMPLANT
EVACUATOR SILICONE 100CC (DRAIN) IMPLANT
GAUZE SPONGE 4X4 12PLY STRL (GAUZE/BANDAGES/DRESSINGS) ×2 IMPLANT
GAUZE SPONGE 4X4 16PLY XRAY LF (GAUZE/BANDAGES/DRESSINGS) ×2 IMPLANT
GLOVE BIO SURGEON STRL SZ7 (GLOVE) ×2 IMPLANT
GLOVE BIO SURGEON STRL SZ8 (GLOVE) ×2 IMPLANT
GLOVE BIOGEL PI IND STRL 7.0 (GLOVE) IMPLANT
GLOVE BIOGEL PI IND STRL 7.5 (GLOVE) ×2 IMPLANT
GLOVE BIOGEL PI IND STRL 8 (GLOVE) ×1 IMPLANT
GLOVE BIOGEL PI INDICATOR 7.0 (GLOVE) ×1
GLOVE BIOGEL PI INDICATOR 7.5 (GLOVE) ×1
GLOVE BIOGEL PI INDICATOR 8 (GLOVE) ×1
GLOVE ECLIPSE 6.5 STRL STRAW (GLOVE) ×1 IMPLANT
GOWN STRL REUS W/ TWL LRG LVL3 (GOWN DISPOSABLE) ×1 IMPLANT
GOWN STRL REUS W/ TWL XL LVL3 (GOWN DISPOSABLE) ×1 IMPLANT
GOWN STRL REUS W/TWL LRG LVL3 (GOWN DISPOSABLE) ×4
GOWN STRL REUS W/TWL XL LVL3 (GOWN DISPOSABLE) ×2
IV CATH 14GX2 1/4 (CATHETERS) ×2 IMPLANT
KIT BASIN OR (CUSTOM PROCEDURE TRAY) ×2 IMPLANT
KIT ROOM TURNOVER OR (KITS) ×2 IMPLANT
MANIFOLD NEPTUNE II (INSTRUMENTS) ×2 IMPLANT
NDL SPNL 20GX3.5 QUINCKE YW (NEEDLE) ×1 IMPLANT
NEEDLE 27GAX1X1/2 (NEEDLE) ×2 IMPLANT
NEEDLE SPNL 20GX3.5 QUINCKE YW (NEEDLE) ×2 IMPLANT
NS IRRIG 1000ML POUR BTL (IV SOLUTION) ×2 IMPLANT
OIL CARTRIDGE MAESTRO DRILL (MISCELLANEOUS) ×2
PACK ORTHO CERVICAL (CUSTOM PROCEDURE TRAY) ×2 IMPLANT
PAD ARMBOARD 7.5X6 YLW CONV (MISCELLANEOUS) ×4 IMPLANT
PATTIES SURGICAL .5 X.5 (GAUZE/BANDAGES/DRESSINGS) ×1 IMPLANT
PATTIES SURGICAL .5 X1 (DISPOSABLE) ×1 IMPLANT
PIN DISTRACTION 12MM (PIN) ×4
PIN DSTRCT 12XNS SS ACIS (PIN) IMPLANT
PLATE SKYLINE 2LVL 24ML (Plate) ×1 IMPLANT
PUTTY BONE DBX 2.5 MIS (Bone Implant) ×1 IMPLANT
SCREW VARIABLE SELF TAP 12MM (Screw) ×6 IMPLANT
SPONGE INTESTINAL PEANUT (DISPOSABLE) ×2 IMPLANT
SPONGE SURGIFOAM ABS GEL 100 (HEMOSTASIS) ×2 IMPLANT
STRIP CLOSURE SKIN 1/2X4 (GAUZE/BANDAGES/DRESSINGS) ×2 IMPLANT
SURGIFLO TRUKIT (HEMOSTASIS) IMPLANT
SUT MNCRL AB 4-0 PS2 18 (SUTURE) ×2 IMPLANT
SUT SILK 4 0 (SUTURE)
SUT SILK 4-0 18XBRD TIE 12 (SUTURE) IMPLANT
SUT VIC AB 2-0 CT2 18 VCP726D (SUTURE) ×2 IMPLANT
SYR BULB IRRIGATION 50ML (SYRINGE) ×2 IMPLANT
SYR CONTROL 10ML LL (SYRINGE) ×4 IMPLANT
TAPE CLOTH 4X10 WHT NS (GAUZE/BANDAGES/DRESSINGS) ×1 IMPLANT
TAPE UMBILICAL COTTON 1/8X30 (MISCELLANEOUS) ×2 IMPLANT
TOWEL OR 17X24 6PK STRL BLUE (TOWEL DISPOSABLE) ×2 IMPLANT
TOWEL OR 17X26 10 PK STRL BLUE (TOWEL DISPOSABLE) ×2 IMPLANT
YANKAUER SUCT BULB TIP NO VENT (SUCTIONS) ×2 IMPLANT

## 2014-02-03 NOTE — H&P (Signed)
PREOPERATIVE H&P  Chief Complaint: left arm pain  HPI: Stephanie Gregory is a 48 y.o. female who presents with ongoing pain in the left arm  MRI reveals severe stenosis at C5/6 and C6/7 on left  Patient has failed multiple forms of conservative care and continues to have pain (see office notes for additional details regarding the patient's full course of treatment)  Past Medical History  Diagnosis Date  . Anxiety   . GERD (gastroesophageal reflux disease)   . Migraine   . Mental disorder     Depression / Anxiety  . Vaginal Pap smear, abnormal     CIN I - History  . SVD (spontaneous vaginal delivery)     x 3  . Depression   . DDD (degenerative disc disease)     has metal plate C4-C5 diskectomy   Past Surgical History  Procedure Laterality Date  . Neck surgery  12/1999    C4-C5 diskectomy  . Nasal sinus surgery  2000  . Dilatation & currettage/hysteroscopy with resectocope N/A 06/16/2013    Procedure: DILATATION & CURETTAGE/HYSTEROSCOPY WITH RESECTOCOPE/CERVICAL POLYPECTOMY;  Surgeon: Sanjuana Kava, MD;  Location: [facility];  Service: Gynecology;  Laterality: N/A;  . Laparoscopy N/A 06/16/2013    Procedure: LAPAROSCOPY OPERATIVE SALPINGO OOPHORECTOMY;  Surgeon: Sanjuana Kava, MD;  Location: [facility];  Service: Gynecology;  Laterality: N/A;  . Tubal ligation  10/2000  . Wisdom tooth extraction    . Cystoscopy N/A 09/15/2013    Procedure: CYSTOSCOPY;  Surgeon: Sanjuana Kava, MD;  Location: [facility];  Service: Gynecology;  Laterality: N/A;  . Laparoscopic assisted vaginal hysterectomy Right 09/15/2013    Procedure: LAPAROSCOPIC ASSISTED VAGINAL HYSTERECTOMY with right salpingo-oophorectomy;  Surgeon: Sanjuana Kava, MD;  Location: [facility];  Service: Gynecology;  Laterality: Right;   History   Social History  . Marital Status: Single    Spouse Name: N/A    Number of Children: N/A  . Years of Education: N/A   Social History Main Topics  . Smoking status: Former Smoker -- 0.50 packs/day for 30  years    Types: Cigarettes    Quit date: 09/07/2013  . Smokeless tobacco: Never Used  . Alcohol Use: No  . Drug Use: No     Comment: once or twice weekly, but daily use this past weekend  . Sexual Activity: Yes    Birth Control/ Protection: Surgical   Other Topics Concern  . None   Social History Narrative  . None   History reviewed. No pertinent family history. No Known Allergies Prior to Admission medications   Medication Sig Start Date End Date Taking? Authorizing Provider  diazepam (VALIUM) 5 MG tablet Take 5 mg by mouth every 12 (twelve) hours as needed for anxiety or muscle spasms.   Yes Historical Provider, MD  docusate sodium (COLACE) 100 MG capsule Take 200 mg by mouth at bedtime.   Yes Historical Provider, MD  estradiol (MINIVELLE) 0.0375 MG/24HR Place 1 patch onto the skin 2 (two) times a week.   Yes Historical Provider, MD  HYDROcodone-acetaminophen (NORCO) 10-325 MG per tablet Take 1 tablet by mouth 3 (three) times daily as needed for severe pain.   Yes Historical Provider, MD  ibuprofen (ADVIL,MOTRIN) 800 MG tablet Take 800 mg by mouth every 8 (eight) hours as needed for moderate pain.   Yes Historical Provider, MD  methocarbamol (ROBAXIN) 500 MG tablet Take 500 mg by mouth 2 (two) times daily as needed for muscle spasms.   Yes Historical  Provider, MD  mirtazapine (REMERON) 30 MG tablet Take 15 mg by mouth at bedtime.   Yes Historical Provider, MD  nicotine (NICODERM CQ - DOSED IN MG/24 HOURS) 21 mg/24hr patch Place 21 mg onto the skin daily.   Yes Historical Provider, MD  ondansetron (ZOFRAN-ODT) 8 MG disintegrating tablet Take 8 mg by mouth every 8 (eight) hours as needed for nausea or vomiting.   Yes Historical Provider, MD  polycarbophil (FIBERCON) 625 MG tablet Take 625 mg by mouth daily.   Yes Historical Provider, MD  rizatriptan (MAXALT-MLT) 10 MG disintegrating tablet Take 10 mg by mouth as needed for migraine. May repeat in 2 hours if needed   Yes Historical  Provider, MD  SUMAtriptan (IMITREX) 100 MG tablet Take 100 mg by mouth every 2 (two) hours as needed for migraine or headache. May repeat in 2 hours if headache persists or recurs.   Yes Historical Provider, MD  topiramate (TOPAMAX) 25 MG tablet Take 25 mg by mouth 2 (two) times daily.   Yes Historical Provider, MD  traZODone (DESYREL) 100 MG tablet Take 3 tablets (300 mg total) by mouth at bedtime. For sleep. 06/07/11  Yes [name], FNP  venlafaxine XR (EFFEXOR-XR) 75 MG 24 hr capsule Take 225 mg by mouth daily with breakfast.   Yes Historical Provider, MD     All other systems have been reviewed and were otherwise negative with the exception of those mentioned in the HPI and as above.  Physical Exam: Filed Vitals:   02/03/14 0644  BP: 112/71  Pulse: 75  Temp: 97.8 F (36.6 C)  Resp: 20    General: Alert, no acute distress Cardiovascular: No pedal edema Respiratory: No cyanosis, no use of accessory musculature Skin: No lesions in the area of chief complaint Neurologic: Sensation intact distally Psychiatric: Patient is competent for consent with normal mood and affect Lymphatic: No axillary or cervical lymphadenopathy  MUSCULOSKELETAL: + spurling on left  Assessment/Plan: Left arm pain Plan for Procedure(s): ANTERIOR CERVICAL DECOMPRESSION/DISCECTOMY FUSION 2 LEVELS   Sinclair Ship, MD 02/03/2014 8:17 AM

## 2014-02-03 NOTE — Anesthesia Preprocedure Evaluation (Addendum)
Anesthesia Evaluation  Patient identified by MRN, date of birth, ID band Patient awake    Reviewed: Allergy & Precautions, H&P , NPO status , Patient's Chart, lab work & pertinent test results  History of Anesthesia Complications Negative for: history of anesthetic complications  Airway Mallampati: II  TM Distance: >3 FB Neck ROM: Limited    Dental  (+) Edentulous Upper, Poor Dentition, Dental Advisory Given   Pulmonary former smoker,  breath sounds clear to auscultation        Cardiovascular negative cardio ROS  Rhythm:Regular Rate:Normal     Neuro/Psych  Headaches, PSYCHIATRIC DISORDERS Anxiety Depression    GI/Hepatic Neg liver ROS, GERD-  ,  Endo/Other  negative endocrine ROS  Renal/GU negative Renal ROS     Musculoskeletal   Abdominal   Peds  Hematology   Anesthesia Other Findings   Reproductive/Obstetrics negative OB ROS                            Anesthesia Physical Anesthesia Plan  ASA: III  Anesthesia Plan: General   Post-op Pain Management:    Induction: Intravenous  Airway Management Planned: Oral ETT  Additional Equipment:   Intra-op Plan:   Post-operative Plan: Extubation in OR  Informed Consent: I have reviewed the patients History and Physical, chart, labs and discussed the procedure including the risks, benefits and alternatives for the proposed anesthesia with the patient or authorized representative who has indicated his/her understanding and acceptance.   Dental advisory given  Plan Discussed with: CRNA and Anesthesiologist  Anesthesia Plan Comments:         Anesthesia Quick Evaluation

## 2014-02-03 NOTE — Transfer of Care (Signed)
Immediate Anesthesia Transfer of Care Note  Patient: Stephanie Gregory  Procedure(s) Performed: Procedure(s) with comments: ANTERIOR CERVICAL DECOMPRESSION/DISCECTOMY FUSION 2 LEVELS (N/A) - Anterior cervical decompression fusion, cervical 5-6, cervical 6-7 with instrumentation and allograft HARDWARE REMOVAL (N/A) - removal of hardware cervical 4-5.  Patient Location: PACU  Anesthesia Type:General  Level of Consciousness: awake, alert , oriented and patient cooperative  Airway & Oxygen Therapy: Patient Spontanous Breathing and Patient connected to nasal cannula oxygen  Post-op Assessment: Report given to PACU RN and Post -op Vital signs reviewed and stable  Post vital signs: Reviewed  Complications: No apparent anesthesia complications

## 2014-02-03 NOTE — Plan of Care (Signed)
Problem: Consults Goal: Diagnosis - Spinal Surgery Outcome: Completed/Met Date Met:  02/03/14 Cervical Spine Fusion     

## 2014-02-03 NOTE — Op Note (Signed)
NAMEAMANA, BOUSKA NO.:  [id number]  MEDICAL RECORD NO.:  [id number]  LOCATION:  3C08C                        FACILITY:  [facility]  PHYSICIAN:  Phylliss Bob, MD      DATE OF BIRTH:  05/29/1965  DATE OF PROCEDURE:  02/03/2014                              OPERATIVE REPORT   PREOPERATIVE DIAGNOSES: 1. C5-6, C6-7 degenerative disk disease. 2. Left-sided neural foraminal stenosis, C5-6, C6-7. 3. Status post C4-5 anterior cervical fusion with instrumentation.  POSTOPERATIVE DIAGNOSES: 1. C5-6, C6-7 degenerative disk disease. 2. Left-sided neural foraminal stenosis, C5-6, C6-7. 3. Status post C4-5 anterior cervical fusion with instrumentation.  PROCEDURES: 1. Anterior cervical decompression and fusion C5-6, C6-7. 2. Placement of anterior instrumentation, C5-C7. 3. Insertion of interbody device x2 (5-mm small Titan interbody     spacers). 4. Exploration of spinal fusion C4-5. 5. Removal of anterior instrumentation, C4-5. 6. Use of morselized allograft-DBX mix. 7. Intraoperative use of fluoroscopy.  SURGEON:  Phylliss Bob, MD  ASSISTANT:  Pricilla Holm, [facility]  ANESTHESIA:  General endotracheal anesthesia.  COMPLICATIONS:  None.  DISPOSITION:  Stable.  ESTIMATED BLOOD LOSS:  Minimal.  INDICATIONS FOR SURGERY:  Briefly, Stephanie Gregory is a very pleasant 48 year old female who did present to me with ongoing pain in the left arm.  She did feel that her pain was severe.  Of note, she is status post a previous C4-5 anterior cervical decompression and fusion with instrumentation by another surgeon many years ago.  I did review an MRI, which was notable for substantial neural foraminal stenosis on the left side at C5-6 and C6-7.  Given the patient's ongoing pain, despite multiple forms of conservative care, we did discuss proceeding with the procedure noted above.  The patient did fully understand the risks and limitations of the procedure as outlined in my  preoperative note.  OPERATIVE DETAILS:  On February 03, 2014, the patient was brought to surgery and general endotracheal anesthesia was administered.  The patient was placed supine on the hospital bed.  The patient's arms were secured to her sides.  All bony prominences were meticulously padded. The neck was gently extended.  The neck was then prepped and draped in the usual sterile fashion.  Antibiotics were given and a time-out was performed.  I then made a left transverse incision immediately beneath the patient's previous left-sided transverse incision.  The platysma was sharply incised.  A Alben Deeds approach was used and the anterior spine was noted.  The C4-5 intervertebral plate was readily identified. I did uneventfully removed the hardware at the C4-5 level.  I then placed Caspar pins into the C4 and C5 vertebral bodies.  A push-pull maneuver was performed to assess the adequacy of the patient's fusion. It was clear that the patient did have a successfully healed C4-5 fusion.  I then turned my attention towards the lower levels.  I then subperiosteally exposed the vertebral bodies of C5, C6 and C7.  A self- retaining retractor was placed, and was centered over the C6-7 interspace.  Anterior osteophytes were identified and removed using a rongeur.  I then placed Caspar pins into the C6 and C7 vertebral bodies and distraction was  applied.  I then performed a standard C6-7 diskectomy.  The posterior longitudinal ligament was identified and entered using a nerve hook.  I then used #1 followed by #2 Kerrison to perform a thorough central and bilateral neural foraminal decompression. The endplates were then prepared and the appropriate size interbody spacer was packed with DBS mix and tamped into position after adequate preparation of the endplates.  A Caspar pin from the C7 vertebral body was removed and bone wax was placed in its place.  I then performed a C5- 6 diskectomy in  the manner previously described.  Once again, a thorough central and bilateral neural foraminal decompression was confirmed.  The endplates were then prepared and the appropriate size interbody spacer was packed with DBX mix and tamped into position in the usual fashion. I then chose the appropriate-sized anterior cervical plate, which was placed over the anterior spine from C5-C7.  A 12-mm screws were placed, 2 in each vertebral body from C5-C7 for a total of 6 screws.  I was very pleased with the press fit of each of the screws.  The screws were then locked to the plate using the CAM locking mechanism.  The wound was then copiously irrigated.  I was very pleased with the AP and lateral fluoroscopic images, which were obtained while placing the hardware. The platysma was then closed using 2-0 Vicryl.  The subcutaneous layer was then closed using 4-0 Monocryl.  Benzoin and Steri-Strips were applied followed by sterile dressing.  All instrument counts were correct at the termination of the procedure.  Of note, Pricilla Holm was my assistant throughout the surgery, and did aid in retraction, suctioning, and closure.     Phylliss Bob, MD     MD/MEDQ  D:  02/03/2014  T:  02/03/2014  Job:  [id number]

## 2014-02-03 NOTE — Anesthesia Postprocedure Evaluation (Signed)
  Anesthesia Post-op Note  Patient: Stephanie Gregory  Procedure(s) Performed: Procedure(s) with comments: ANTERIOR CERVICAL DECOMPRESSION/DISCECTOMY FUSION 2 LEVELS (N/A) - Anterior cervical decompression fusion, cervical 5-6, cervical 6-7 with instrumentation and allograft HARDWARE REMOVAL (N/A) - removal of hardware cervical 4-5.  Patient Location: PACU  Anesthesia Type:General  Level of Consciousness: awake  Airway and Oxygen Therapy: Patient Spontanous Breathing  Post-op Pain: mild  Post-op Assessment: Post-op Vital signs reviewed  Post-op Vital Signs: Reviewed  Last Vitals:  Filed Vitals:   02/03/14 1155  BP: 135/75  Pulse: 92  Temp:   Resp: 18    Complications: No apparent anesthesia complications

## 2014-02-03 NOTE — Anesthesia Procedure Notes (Signed)
Procedure Name: Intubation Date/Time: 02/03/2014 8:38 AM Performed by: Jenne Campus Pre-anesthesia Checklist: Patient identified, Emergency Drugs available, Suction available, Patient being monitored and Timeout performed Patient Re-evaluated:Patient Re-evaluated prior to inductionOxygen Delivery Method: Circle system utilized Preoxygenation: Pre-oxygenation with 100% oxygen Intubation Type: IV induction Ventilation: Mask ventilation without difficulty Grade View: Grade I Tube type: Oral Tube size: 7.0 mm Number of attempts: 1 Airway Equipment and Method: Stylet and Video-laryngoscopy Placement Confirmation: positive ETCO2,  CO2 detector,  breath sounds checked- equal and bilateral and ETT inserted through vocal cords under direct vision Secured at: 21 cm Tube secured with: Tape Dental Injury: Teeth and Oropharynx as per pre-operative assessment  Comments: Smooth IV induction. EZ mask. Elective video-laryngoscopy. Grade I view. Head/neck/cervical spine in neutral position during induction and intubation. 7.0 ETT passed easily. +ETCO2 bbse.

## 2014-02-03 NOTE — Progress Notes (Signed)
Dr Oletta Lamas called and informed of pt taking a few sips of Dr Malachi Bonds at 0500 this am.

## 2014-02-03 NOTE — Progress Notes (Signed)
Orthopedic Tech Progress Note Patient Details:  Stephanie Gregory Aug 19, 1965 [id number]  Left [place] cervical collar with pt.'s nurse. Patient ID: Stephanie Gregory, female   DOB: [birth year], 48 y.o.   MRN: [id number]   [name] 02/03/2014, 3:30 PM

## 2014-02-04 DIAGNOSIS — M5032 Other cervical disc degeneration, mid-cervical region: Secondary | ICD-10-CM | POA: Diagnosis not present

## 2014-02-04 NOTE — Discharge Instructions (Signed)
Anterior Cervical Diskectomy and Fusion °Anterior cervical diskectomy is surgery done on the upper spine to relieve pressure on one or more nerve roots, or on the spinal cord. There are 7 bones in your neck, called the cervical spine. These 7 bones (vertebrae) sit one on top of the other. Cushions (intervertebral disks) separate the vertebrae and act like shock absorbers. As we age, degeneration of our bones, joints, and disks can cause neck pain and tightening around the spinal cord and nerve roots. This causes arm pain and weakness.  °Degeneration involves: °· Herniated Disk. With age, the disks dry up and can rupture. In this condition, the center of the disk bulges out (disk herniation). This can cause pressure on a nerve, which produces pain or weakness in the arm. °· Bone spurs and spinal stenosis. As we age, growths often develop on our bones. These growths are called bone spurs (osteophytes). A bone spur is a collection of calcium. As bone spurs grow and extend, the vertebral openings become narrow. The spinal canal and/or the foramen (opening for nerve passageways) become smaller. This narrowing (stenosis) may cause pinching (compression) of the spinal cord or the spinal nerve root. The nerve injury can cause pain, weakness, numbness, and loss of coordination in the upper limbs. Often, patients have difficulty with their hand writing or they start dropping things, because their hand grip is weaker. The spinal cord damage can cause increased stiffness, more frequent falls, electric shooting pain, and changes in bowel and bladder control. °Degeneration in the neck results in three common problems: °· Radiculopathy - Nerve compression that results in weakness or pain that radiates down the arm. °· Myelopathy - Spinal cord compression that causes stiffness, difficulty with walking, coordination, and trouble with bowel or bladder habits. °· Neck pain - Worn out joints cause pain as the neck  moves. °Treatment: °· Radiculopathy - Surgery is performed to remove the bony and disk material that is pushing on the nerve. °· Myelopathy - Surgery is performed to remove the bony and disk material that pushes on the spinal cord. °· Neck pain - Surgery is performed to combine (fuse) the joints of the neck together, so they cannot move or cause pain. °Surgery can be done from the front or the back of the neck. When it is done from the front, it is called an anterior (front) cervical (neck) diskectomy (removal of the disk) and fusion. °LET YOUR CAREGIVER KNOW ABOUT:  °· Recent infections. °· Any shooting pains down your leg, when you move your neck. °· Any difficulty swallowing. °· A smoking history. °· Use of blood thinners or anti-inflammatory medicines. °· Any history of injury to your shoulders. °· Any history of injury to your vocal cords. °· Any foreign objects in your body from a previous surgery. °· Any recent fevers or illness. °· Past medical history (diabetes, strokes). °· Past problems with anesthetics. °· Possibility of pregnancy. °· History of blood clots (deep vein thrombosis). °· History of bleeding or blood problems. °· Past surgeries. °· Other health problems. °· Allergies. °· Medicines you take, including herbs, eye drops, over-the-counter medicines, and creams. °· Use of steroids (by mouth or creams). °RISKS AND COMPLICATIONS °· Infection. °· Bleeding. °· Injury to the following structures: °¨ Carotid artery. This can result in a stroke or significant amount of bleeding. °¨ Esophagus, resulting in difficulty swallowing. °¨ Recurrent laryngeal nerve, resulting in hoarseness of the voice. °¨ Spinal cord injury, ranging from mild to complete quadriparesis (muscle weakness in   all four limbs). °¨ Nerve root injury, resulting in muscle weakness in the upper limb. °¨ Leakage of cerebrospinal fluid. °BEFORE THE PROCEDURE  °· You will be given medicine to help you sleep (general anesthetic), and a  breathing tube will be placed. °· You will be given antibiotics to keep the infection rate down. °· The incision site on your neck will be marked. °· Your neck will be cleaned, to reduce the risk of infection. °PROCEDURE  °An anterior cervical fusion means that the operation is done through the front (anterior) part of your neck. The cut made by the surgeon (incision) is usually within a skin fold line on the neck. After pushing aside the neck muscles, the surgeon removes the affected, degenerated disk and bone spurs (osteophytes), which takes the pressure off the nerves and spinal cord. This is called a decompression. The area where the disk was removed is then filled with a small piece of plastic. This plastic takes the place of the disk and keeps the nerve passageway (foramen) open and clear for the nerves. In most cases, the surgeon uses metal plates or pins (hardware) in the neck, to help stabilize the level being fused. The hardware reduces motion at that level, so it can fuse. This provides extra support to the neck. A cervical fusion procedure takes anywhere from a couple to several hours, depending on the size of the neck, history of previous surgery, and number of levels being fused. °AFTER THE PROCEDURE  °· You will likely spend 24-48 hours in the hospital. During this time, your caregivers will look for any signs of complications from the procedure. °· Your caregiver will watch you, to make sure that fluid draining from the surgery slows down. It is important that a large mass of blood does not form in your neck, which would cause difficulty with breathing. °· You will get 24 hours of antibiotics. °· You can start to eat as soon as you feel comfortable. °· Once you have started eating, walking, urinating (voiding) and having bowel movements on your own, your caregiver will discharge you home. °HOME CARE INSTRUCTIONS  °· For 2 weeks, do not soak the incision site under water. Do not swim or take baths.  Showers are okay, but rinse off the incision sites. °· Do not over exert yourself. Allow time for the incision to heal. °· It can take from 6 weeks to 6 months for fusion to take effect. Your caregiver may ask you to wear a neck collar during this time, as they check the fusion with multiple (serial) X-rays. °Document Released: 03/14/2009 Document Revised: 07/21/2012 Document Reviewed: 03/14/2009 °Exit[employer]® Patient Information ©2015 [employer]. This information is not intended to replace advice given to you by your health care provider. Make sure you discuss any questions you have with your health care provider. ° °

## 2014-02-04 NOTE — Progress Notes (Signed)
    Patient doing well Left arm pain is resolved   Physical Exam: Filed Vitals:   02/04/14 0409  BP: 107/52  Pulse: 66  Temp: 98.3 F (36.8 C)  Resp: 16    Neck soft/supple Dressing in place NVI  POD #1 s/p C5-7 ACDF  - encourage ambulation - Percocet for pain, Valium for muscle spasms - likely d/c home today

## 2014-02-04 NOTE — Progress Notes (Signed)
Pt doing well. Pt given D/C instructions with Rx's, verbal understanding was provided. Pt's dressing was clean and dry with no sign of infection. Pt's IV was removed prior to D/C. Pt has Aspen collar on and [place] collar was provided for home use prior to D/C. Pt D/C'd home via wheelchair @ [phone] per MD order. Pt is stable @ D/C and has no other needs at this time. Holli Humbles, RN

## 2014-02-08 ENCOUNTER — Encounter (HOSPITAL_COMMUNITY): Payer: Self-pay | Admitting: Orthopedic Surgery

## 2014-02-24 NOTE — Discharge Summary (Signed)
Patient ID: Stephanie Gregory MRN: [id number] DOB/AGE: 1965/08/16 48 y.o.  Admit date: 02/03/2014 Discharge date: 02/04/2014  Admission Diagnoses:  Active Problems:   Radiculopathy   Discharge Diagnoses:  Same  Past Medical History  Diagnosis Date  . Anxiety   . GERD (gastroesophageal reflux disease)   . Migraine   . Mental disorder     Depression / Anxiety  . Vaginal Pap smear, abnormal     CIN I - History  . SVD (spontaneous vaginal delivery)     x 3  . Depression   . DDD (degenerative disc disease)     has metal plate C4-C5 diskectomy    Surgeries: Procedure(s): ANTERIOR CERVICAL DECOMPRESSION/DISCECTOMY FUSION 2 LEVELS C5-7 HARDWARE REMOVAL on 02/03/2014   Consultants:  None  Discharged Condition: Improved  Hospital Course: Stephanie Gregory is an 48 y.o. female who was admitted 02/03/2014 for operative treatment of radiculopathy. Patient has severe unremitting pain that affects sleep, daily activities, and work/hobbies. After pre-op clearance the patient was taken to the operating room on 02/03/2014 and underwent  Procedure(s): ANTERIOR CERVICAL DECOMPRESSION/DISCECTOMY FUSION 2 LEVELS C5-7 HARDWARE REMOVAL.    Patient was given perioperative antibiotics:  Anti-infectives    Start     Dose/Rate Route Frequency Ordered Stop   02/03/14 1600  ceFAZolin (ANCEF) IVPB 1 g/50 mL premix     1 g100 mL/hr over 30 Minutes Intravenous Every 8 hours 02/03/14 1352 02/04/14 0023   02/03/14 0600  ceFAZolin (ANCEF) IVPB 2 g/50 mL premix     2 g100 mL/hr over 30 Minutes Intravenous On call to O.R. 02/02/14 1448 02/03/14 5427       Patient was given sequential compression devices, early ambulation to prevent DVT.  Patient benefited maximally from hospital stay and there were no complications.    Recent vital signs: BP 102/57 mmHg  Pulse 85  Temp(Src) 98.3 F (36.8 C) (Oral)  Resp 16  Wt 53.524 kg (118 lb)  SpO2 97%  LMP 06/09/2013   Discharge Medications:       Medication List    STOP taking these medications        HYDROcodone-acetaminophen 10-325 MG per tablet  Commonly known as:  NORCO     ibuprofen 800 MG tablet  Commonly known as:  ADVIL,MOTRIN     methocarbamol 500 MG tablet  Commonly known as:  ROBAXIN      TAKE these medications        diazepam 5 MG tablet  Commonly known as:  VALIUM  Take 5 mg by mouth every 12 (twelve) hours as needed for anxiety or muscle spasms.     docusate sodium 100 MG capsule  Commonly known as:  COLACE  Take 200 mg by mouth at bedtime.     MINIVELLE 0.0375 MG/24HR  Generic drug:  estradiol  Place 1 patch onto the skin 2 (two) times a week.     mirtazapine 30 MG tablet  Commonly known as:  REMERON  Take 15 mg by mouth at bedtime.     nicotine 21 mg/24hr patch  Commonly known as:  NICODERM CQ - dosed in mg/24 hours  Place 21 mg onto the skin daily.     ondansetron 8 MG disintegrating tablet  Commonly known as:  ZOFRAN-ODT  Take 8 mg by mouth every 8 (eight) hours as needed for nausea or vomiting.     polycarbophil 625 MG tablet  Commonly known as:  FIBERCON  Take 625 mg by mouth daily.  rizatriptan 10 MG disintegrating tablet  Commonly known as:  MAXALT-MLT  Take 10 mg by mouth as needed for migraine. May repeat in 2 hours if needed     SUMAtriptan 100 MG tablet  Commonly known as:  IMITREX  Take 100 mg by mouth every 2 (two) hours as needed for migraine or headache. May repeat in 2 hours if headache persists or recurs.     topiramate 25 MG tablet  Commonly known as:  TOPAMAX  Take 25 mg by mouth 2 (two) times daily.     traZODone 100 MG tablet  Commonly known as:  DESYREL  Take 3 tablets (300 mg total) by mouth at bedtime. For sleep.     venlafaxine XR 75 MG 24 hr capsule  Commonly known as:  EFFEXOR-XR  Take 225 mg by mouth daily with breakfast.        Diagnostic Studies: Dg Cervical Spine 2-3 Views  02/03/2014   CLINICAL DATA:  ACDF, 2 levels.  EXAM: DG C-ARM 61-120  MIN; CERVICAL SPINE - 2-3 VIEW  FLUOROSCOPY TIME:  0 min 11 seconds.  COMPARISON:  07/07/2012.  FINDINGS: Three intraoperative fluoroscopic spot views of the cervical spine are submitted in the lateral projection. The first image, taken at 855 hr, shows a surgical instrument tip projecting anterior to the C6-7 disc space. Prior C4-5 anterior cervical fusion. Multilevel spondylosis.  The subsequent 2 images, taken at 1105 hr, show removal of C4-5 hardware with anterior cervical fusion and interbody spacers at C6-T1.  IMPRESSION: 1. Removal of C4-5 hardware with interval C6-T1 anterior cervical fusion. 2. Cervical spondylosis.   Electronically Signed   By: Lorin Picket M.D.   On: 02/03/2014 11:29   Dg C-arm 1-60 Min  02/03/2014   CLINICAL DATA:  ACDF, 2 levels.  EXAM: DG C-ARM 61-120 MIN; CERVICAL SPINE - 2-3 VIEW  FLUOROSCOPY TIME:  0 min 11 seconds.  COMPARISON:  07/07/2012.  FINDINGS: Three intraoperative fluoroscopic spot views of the cervical spine are submitted in the lateral projection. The first image, taken at 855 hr, shows a surgical instrument tip projecting anterior to the C6-7 disc space. Prior C4-5 anterior cervical fusion. Multilevel spondylosis.  The subsequent 2 images, taken at 1105 hr, show removal of C4-5 hardware with anterior cervical fusion and interbody spacers at C6-T1.  IMPRESSION: 1. Removal of C4-5 hardware with interval C6-T1 anterior cervical fusion. 2. Cervical spondylosis.   Electronically Signed   By: Lorin Picket M.D.   On: 02/03/2014 11:29    Disposition: 01-Home or Self Care      Discharge Instructions    Discharge patient    Complete by:  As directed           POD #1 s/p C5-7 ACDF  -Written scripts for pain signed and in chart -D/C instructions sheet printed and in chart -D/C today  -F/U in office 2 weeks   Signed: Justice Britain 02/24/2014, 3:03 PM

## 2014-04-09 DIAGNOSIS — C801 Malignant (primary) neoplasm, unspecified: Secondary | ICD-10-CM

## 2014-04-09 HISTORY — PX: COLON SURGERY: SHX602

## 2014-04-09 HISTORY — DX: Malignant (primary) neoplasm, unspecified: C80.1

## 2014-05-28 ENCOUNTER — Other Ambulatory Visit: Payer: Self-pay | Admitting: Orthopedic Surgery

## 2014-06-07 ENCOUNTER — Encounter (HOSPITAL_COMMUNITY): Payer: Self-pay

## 2014-06-07 ENCOUNTER — Encounter (HOSPITAL_COMMUNITY)
Admission: RE | Admit: 2014-06-07 | Discharge: 2014-06-07 | Disposition: A | Discharge status: Home / Self Care | Payer: BLUE CROSS/BLUE SHIELD | Source: Ambulatory Visit | Attending: Orthopedic Surgery | Admitting: Orthopedic Surgery

## 2014-06-07 DIAGNOSIS — M5011 Cervical disc disorder with radiculopathy,  high cervical region: Secondary | ICD-10-CM | POA: Diagnosis not present

## 2014-06-07 DIAGNOSIS — Z79899 Other long term (current) drug therapy: Secondary | ICD-10-CM | POA: Diagnosis not present

## 2014-06-07 DIAGNOSIS — F329 Major depressive disorder, single episode, unspecified: Secondary | ICD-10-CM | POA: Diagnosis not present

## 2014-06-07 DIAGNOSIS — K219 Gastro-esophageal reflux disease without esophagitis: Secondary | ICD-10-CM | POA: Diagnosis not present

## 2014-06-07 DIAGNOSIS — F419 Anxiety disorder, unspecified: Secondary | ICD-10-CM | POA: Diagnosis not present

## 2014-06-07 DIAGNOSIS — F17210 Nicotine dependence, cigarettes, uncomplicated: Secondary | ICD-10-CM | POA: Diagnosis not present

## 2014-06-07 LAB — URINALYSIS, ROUTINE W REFLEX MICROSCOPIC
BILIRUBIN URINE: NEGATIVE
Glucose, UA: NEGATIVE mg/dL
Hgb urine dipstick: NEGATIVE
Ketones, ur: NEGATIVE mg/dL
NITRITE: NEGATIVE
Protein, ur: NEGATIVE mg/dL
Specific Gravity, Urine: 1.004 — ABNORMAL LOW (ref 1.005–1.030)
UROBILINOGEN UA: 0.2 mg/dL (ref 0.0–1.0)
pH: 6.5 (ref 5.0–8.0)

## 2014-06-07 LAB — COMPREHENSIVE METABOLIC PANEL
ALBUMIN: 3.4 g/dL — AB (ref 3.5–5.2)
ALT: 9 U/L (ref 0–35)
ANION GAP: 7 (ref 5–15)
AST: 13 U/L (ref 0–37)
Alkaline Phosphatase: 54 U/L (ref 39–117)
BILIRUBIN TOTAL: 0.1 mg/dL — AB (ref 0.3–1.2)
BUN: 15 mg/dL (ref 6–23)
CO2: 28 mmol/L (ref 19–32)
Calcium: 8.6 mg/dL (ref 8.4–10.5)
Chloride: 99 mmol/L (ref 96–112)
Creatinine, Ser: 0.68 mg/dL (ref 0.50–1.10)
GFR calc Af Amer: >90 mL/min (ref >90)
GFR calc non Af Amer: >90 mL/min (ref >90)
Glucose, Bld: 92 mg/dL (ref 70–99)
Potassium: 3.5 mmol/L (ref 3.5–5.1)
Sodium: 134 mmol/L — ABNORMAL LOW (ref 135–145)
TOTAL PROTEIN: 6.5 g/dL (ref 6.0–8.3)

## 2014-06-07 LAB — URINE MICROSCOPIC-ADD ON

## 2014-06-07 LAB — CBC WITH DIFFERENTIAL/PLATELET
BASOS ABS: 0.1 10*3/uL (ref 0.0–0.1)
BASOS PCT: 1 % (ref 0–1)
EOS ABS: 0.1 10*3/uL (ref 0.0–0.7)
EOS PCT: 1 % (ref 0–5)
HEMATOCRIT: 40.3 % (ref 36.0–46.0)
HEMOGLOBIN: 13.4 g/dL (ref 12.0–15.0)
Lymphocytes Relative: 26 % (ref 12–46)
Lymphs Abs: 3 10*3/uL (ref 0.7–4.0)
MCH: 29.3 pg (ref 26.0–34.0)
MCHC: 33.3 g/dL (ref 30.0–36.0)
MCV: 88.2 fL (ref 78.0–100.0)
MONO ABS: 1 10*3/uL (ref 0.1–1.0)
MONOS PCT: 8 % (ref 3–12)
Neutro Abs: 7.6 10*3/uL (ref 1.7–7.7)
Neutrophils Relative %: 64 % (ref 43–77)
Platelets: 250 10*3/uL (ref 150–400)
RBC: 4.57 MIL/uL (ref 3.87–5.11)
RDW: 13.6 % (ref 11.5–15.5)
WBC: 11.7 10*3/uL — ABNORMAL HIGH (ref 4.0–10.5)

## 2014-06-07 LAB — SURGICAL PCR SCREEN
MRSA, PCR: POSITIVE — AB
Staphylococcus aureus: POSITIVE — AB

## 2014-06-07 LAB — PROTIME-INR
INR: 0.97 (ref 0.00–1.49)
PROTHROMBIN TIME: 12.9 s (ref 11.6–15.2)

## 2014-06-07 LAB — APTT: aPTT: 30 seconds (ref 24–37)

## 2014-06-07 NOTE — Pre-Procedure Instructions (Signed)
KRISTI NORMENT  06/07/2014   Your procedure is scheduled on:   Wednesday  06/09/14  Report to [facility] Admitting at 1230 PM.  Call this number if you have problems the morning of surgery: [phone]   Remember:   Do not eat food or drink liquids after midnight.   Take these medicines the morning of surgery with A SIP OF WATER:  ALPRAZOLAM(XANAX), CITALOPRAM(CELEXA), ESTRADIOL(ESTRACE) ,OXYCODONE (PERCOCET) IF NEEDED, ROPINIROLE(REQUIP)   Do not wear jewelry, make-up or nail polish.  Do not wear lotions, powders, or perfumes. You may wear deodorant.  Do not shave 48 hours prior to surgery. Men may shave face and neck.  Do not bring valuables to the hospital.  [facility] is not responsible                  for any belongings or valuables.               Contacts, dentures or bridgework may not be worn into surgery.  Leave suitcase in the car. After surgery it may be brought to your room.  For patients admitted to the hospital, discharge time is determined by your                treatment team.               Patients discharged the day of surgery will not be allowed to drive  home.  Name and phone number of your driver:  Special Instructions: [facility] - Preparing for Surgery  Before surgery, you can play an important role.  Because skin is not sterile, your skin needs to be as free of germs as possible.  You can reduce the number of germs on you skin by washing with CHG (chlorahexidine gluconate) soap before surgery.  CHG is an antiseptic cleaner which kills germs and bonds with the skin to continue killing germs even after washing.  Please DO NOT use if you have an allergy to CHG or antibacterial soaps.  If your skin becomes reddened/irritated stop using the CHG and inform your nurse when you arrive at Short Stay.  Do not shave (including legs and underarms) for at least 48 hours prior to the first CHG shower.  You may shave your face.  Please follow these instructions  carefully:   1.  Shower with CHG Soap the night before surgery and the                                morning of Surgery.  2.  If you choose to wash your hair, wash your hair first as usual with your       normal shampoo.  3.  After you shampoo, rinse your hair and body thoroughly to remove the                      Shampoo.  4.  Use CHG as you would any other liquid soap.  You can apply chg directly       to the skin and wash gently with scrungie or a clean washcloth.  5.  Apply the CHG Soap to your body ONLY FROM THE NECK DOWN.        Do not use on open wounds or open sores.  Avoid contact with your eyes,       ears, mouth and genitals (private parts).  Wash genitals (private parts)  with your normal soap.  6.  Wash thoroughly, paying special attention to the area where your surgery        will be performed.  7.  Thoroughly rinse your body with warm water from the neck down.  8.  DO NOT shower/wash with your normal soap after using and rinsing off       the CHG Soap.  9.  Pat yourself dry with a clean towel.            10.  Wear clean pajamas.            11.  Place clean sheets on your bed the night of your first shower and do not        sleep with pets.  Day of Surgery  Do not apply any lotions/deoderants the morning of surgery.  Please wear clean clothes to the hospital/surgery center.     Please read over the following fact sheets that you were given: Pain Booklet, Coughing and Deep Breathing, MRSA Information and Surgical Site Infection Prevention

## 2014-06-08 MED ORDER — CEFAZOLIN SODIUM-DEXTROSE 2-3 GM-% IV SOLR
2.0000 g | INTRAVENOUS | Status: AC
  Administered 2014-06-09: 2 g via INTRAVENOUS

## 2014-06-08 NOTE — H&P (Signed)
PREOPERATIVE H&P  Chief Complaint: left arm pain  HPI: Stephanie Gregory is a 49 y.o. female who presents with ongoing pain in the left arm  MRI reveals stenosis at C3/4 correlating to patient's pain  Patient has failed multiple forms of conservative care and continues to have pain (see office notes for additional details regarding the patient's full course of treatment)  Past Medical History  Diagnosis Date  . Anxiety   . GERD (gastroesophageal reflux disease)   . Migraine   . Mental disorder     Depression / Anxiety  . Vaginal Pap smear, abnormal     CIN I - History  . SVD (spontaneous vaginal delivery)     x 3  . Depression   . DDD (degenerative disc disease)     has metal plate C4-C5 diskectomy   Past Surgical History  Procedure Laterality Date  . Neck surgery  12/1999    C4-C5 diskectomy  . Nasal sinus surgery  2000  . Dilatation & currettage/hysteroscopy with resectocope N/A 06/16/2013    Procedure: DILATATION & CURETTAGE/HYSTEROSCOPY WITH RESECTOCOPE/CERVICAL POLYPECTOMY;  Surgeon: Sanjuana Kava, MD;  Location: [facility];  Service: Gynecology;  Laterality: N/A;  . Laparoscopy N/A 06/16/2013    Procedure: LAPAROSCOPY OPERATIVE SALPINGO OOPHORECTOMY;  Surgeon: Sanjuana Kava, MD;  Location: [facility];  Service: Gynecology;  Laterality: N/A;  . Tubal ligation  10/2000  . Wisdom tooth extraction    . Cystoscopy N/A 09/15/2013    Procedure: CYSTOSCOPY;  Surgeon: Sanjuana Kava, MD;  Location: [facility];  Service: Gynecology;  Laterality: N/A;  . Laparoscopic assisted vaginal hysterectomy Right 09/15/2013    Procedure: LAPAROSCOPIC ASSISTED VAGINAL HYSTERECTOMY with right salpingo-oophorectomy;  Surgeon: Sanjuana Kava, MD;  Location: [facility];  Service: Gynecology;  Laterality: Right;  . Anterior cervical decomp/discectomy fusion N/A 02/03/2014    Procedure: ANTERIOR CERVICAL DECOMPRESSION/DISCECTOMY FUSION 2 LEVELS;  Surgeon: Sinclair Ship, MD;  Location: [facility];  Service: Orthopedics;   Laterality: N/A;  Anterior cervical decompression fusion, cervical 5-6, cervical 6-7 with instrumentation and allograft  . Hardware removal N/A 02/03/2014    Procedure: HARDWARE REMOVAL;  Surgeon: Sinclair Ship, MD;  Location: [place];  Service: Orthopedics;  Laterality: N/A;  removal of hardware cervical 4-5.   History   Social History  . Marital Status: Single    Spouse Name: N/A  . Number of Children: N/A  . Years of Education: N/A   Social History Main Topics  . Smoking status: Current Every Day Smoker -- 0.50 packs/day for 30 years    Types: Cigarettes    Last Attempt to Quit: 09/07/2013  . Smokeless tobacco: Never Used  . Alcohol Use: No  . Drug Use: No     Comment: once or twice weekly, but daily use this past weekend  . Sexual Activity: Yes    Birth Control/ Protection: Surgical   Other Topics Concern  . Not on file   Social History Narrative   No family history on file. Allergies  Allergen Reactions  . Cyclobenzaprine Swelling    tongue  . Methocarbamol Swelling    tongue   Prior to Admission medications   Medication Sig Start Date End Date Taking? Authorizing Provider  ALPRAZolam Duanne Moron) 1 MG tablet Take 1 mg by mouth 3 (three) times daily.   Yes Historical Provider, MD  citalopram (CELEXA) 20 MG tablet Take 20 mg by mouth daily.   Yes Historical Provider, MD  docusate sodium (COLACE) 100 MG capsule Take  200 mg by mouth at bedtime.   Yes Historical Provider, MD  estradiol (ESTRACE) 1 MG tablet Take 1 mg by mouth daily.   Yes Historical Provider, MD  nicotine (NICODERM CQ - DOSED IN MG/24 HOURS) 21 mg/24hr patch Place 21 mg onto the skin daily.   Yes Historical Provider, MD  ondansetron (ZOFRAN-ODT) 8 MG disintegrating tablet Take 8 mg by mouth every 8 (eight) hours as needed for nausea or vomiting.   Yes Historical Provider, MD  oxyCODONE-acetaminophen (PERCOCET) 10-325 MG per tablet Take 1 tablet by mouth every 6 (six) hours as needed for pain.   Yes  Historical Provider, MD  polycarbophil (FIBERCON) 625 MG tablet Take 625 mg by mouth daily.   Yes Historical Provider, MD  rizatriptan (MAXALT-MLT) 10 MG disintegrating tablet Take 10 mg by mouth as needed for migraine. May repeat in 2 hours if needed   Yes Historical Provider, MD  rOPINIRole (REQUIP) 1 MG tablet Take 1 mg by mouth 3 (three) times daily.   Yes Historical Provider, MD  SUMAtriptan (IMITREX) 100 MG tablet Take 100 mg by mouth every 2 (two) hours as needed for migraine or headache. May repeat in 2 hours if headache persists or recurs.   Yes Historical Provider, MD  temazepam (RESTORIL) 30 MG capsule Take 30 mg by mouth at bedtime as needed for sleep.   Yes Historical Provider, MD  traZODone (DESYREL) 100 MG tablet Take 3 tablets (300 mg total) by mouth at bedtime. For sleep. Patient not taking: Reported on 06/03/2014 06/07/11   Stephanie Castilla, FNP     All other systems have been reviewed and were otherwise negative with the exception of those mentioned in the HPI and as above.  Physical Exam: There were no vitals filed for this visit.  General: Alert, no acute distress Cardiovascular: No pedal edema Respiratory: No cyanosis, no use of accessory musculature Skin: No lesions in the area of chief complaint Neurologic: Sensation intact distally Psychiatric: Patient is competent for consent with normal mood and affect Lymphatic: No axillary or cervical lymphadenopathy   Assessment/Plan: Left arm pain Plan for Procedure(s): ANTERIOR CERVICAL DECOMPRESSION/DISCECTOMY FUSION 1 LEVEL   Sinclair Ship, MD 06/08/2014 8:39 AM

## 2014-06-09 ENCOUNTER — Observation Stay (HOSPITAL_COMMUNITY)
Admission: RE | Admit: 2014-06-09 | Discharge: 2014-06-10 | Disposition: A | Discharge status: Home / Self Care | Payer: BLUE CROSS/BLUE SHIELD | Source: Ambulatory Visit | Attending: Orthopedic Surgery | Admitting: Orthopedic Surgery

## 2014-06-09 ENCOUNTER — Ambulatory Visit (HOSPITAL_COMMUNITY): Payer: BLUE CROSS/BLUE SHIELD | Admitting: Anesthesiology

## 2014-06-09 ENCOUNTER — Encounter (HOSPITAL_COMMUNITY)
Admission: RE | Disposition: A | Discharge status: Home / Self Care | Payer: Self-pay | Source: Ambulatory Visit | Attending: Orthopedic Surgery

## 2014-06-09 ENCOUNTER — Encounter (HOSPITAL_COMMUNITY): Payer: Self-pay | Admitting: Surgery

## 2014-06-09 ENCOUNTER — Ambulatory Visit (HOSPITAL_COMMUNITY): Payer: BLUE CROSS/BLUE SHIELD

## 2014-06-09 DIAGNOSIS — F329 Major depressive disorder, single episode, unspecified: Secondary | ICD-10-CM | POA: Insufficient documentation

## 2014-06-09 DIAGNOSIS — M5030 Other cervical disc degeneration, unspecified cervical region: Secondary | ICD-10-CM | POA: Diagnosis present

## 2014-06-09 DIAGNOSIS — Z79899 Other long term (current) drug therapy: Secondary | ICD-10-CM | POA: Insufficient documentation

## 2014-06-09 DIAGNOSIS — Z419 Encounter for procedure for purposes other than remedying health state, unspecified: Secondary | ICD-10-CM

## 2014-06-09 DIAGNOSIS — K219 Gastro-esophageal reflux disease without esophagitis: Secondary | ICD-10-CM | POA: Insufficient documentation

## 2014-06-09 DIAGNOSIS — M5011 Cervical disc disorder with radiculopathy,  high cervical region: Principal | ICD-10-CM | POA: Insufficient documentation

## 2014-06-09 DIAGNOSIS — F17210 Nicotine dependence, cigarettes, uncomplicated: Secondary | ICD-10-CM | POA: Insufficient documentation

## 2014-06-09 DIAGNOSIS — F419 Anxiety disorder, unspecified: Secondary | ICD-10-CM | POA: Insufficient documentation

## 2014-06-09 HISTORY — PX: ANTERIOR CERVICAL DECOMP/DISCECTOMY FUSION: SHX1161

## 2014-06-09 SURGERY — ANTERIOR CERVICAL DECOMPRESSION/DISCECTOMY FUSION 1 LEVEL
Anesthesia: General | Site: Spine Cervical

## 2014-06-09 MED ORDER — ESTRADIOL 1 MG PO TABS
1.0000 mg | ORAL_TABLET | Freq: Every day | ORAL | Status: DC
  Administered 2014-06-09: 1 mg via ORAL
  Filled 2014-06-09 (×2): qty 1

## 2014-06-09 MED ORDER — DOCUSATE SODIUM 100 MG PO CAPS: 100.0000 mg | ORAL_CAPSULE | Freq: Two times a day (BID) | ORAL | Status: DC

## 2014-06-09 MED ORDER — CHLORHEXIDINE GLUCONATE CLOTH 2 % EX PADS
6.0000 | MEDICATED_PAD | Freq: Every day | CUTANEOUS | Status: DC
  Administered 2014-06-10: 6 via TOPICAL

## 2014-06-09 MED ORDER — MIDAZOLAM HCL 2 MG/2ML IJ SOLN
INTRAMUSCULAR | Status: AC
  Filled 2014-06-09: qty 2

## 2014-06-09 MED ORDER — FLEET ENEMA 7-19 GM/118ML RE ENEM: 1.0000 | ENEMA | Freq: Once | RECTAL | Status: AC | PRN

## 2014-06-09 MED ORDER — CEFAZOLIN SODIUM 1-5 GM-% IV SOLN
1.0000 g | Freq: Three times a day (TID) | INTRAVENOUS | Status: DC
  Administered 2014-06-09: 1 g via INTRAVENOUS
  Filled 2014-06-09 (×2): qty 50

## 2014-06-09 MED ORDER — NEOSTIGMINE METHYLSULFATE 10 MG/10ML IV SOLN
INTRAVENOUS | Status: DC | PRN
  Administered 2014-06-09: 2 mg via INTRAVENOUS

## 2014-06-09 MED ORDER — EPHEDRINE SULFATE 50 MG/ML IJ SOLN
INTRAMUSCULAR | Status: DC | PRN
  Administered 2014-06-09: 10 mg via INTRAVENOUS
  Administered 2014-06-09: 5 mg via INTRAVENOUS
  Administered 2014-06-09: 10 mg via INTRAVENOUS

## 2014-06-09 MED ORDER — OXYCODONE-ACETAMINOPHEN 5-325 MG PO TABS
1.0000 | ORAL_TABLET | ORAL | Status: DC | PRN
  Administered 2014-06-09 – 2014-06-10 (×4): 2 via ORAL
  Filled 2014-06-09 (×4): qty 2

## 2014-06-09 MED ORDER — ONDANSETRON HCL 4 MG/2ML IJ SOLN: 4.0000 mg | Freq: Once | INTRAMUSCULAR | Status: DC | PRN

## 2014-06-09 MED ORDER — SODIUM CHLORIDE 0.9 % IV SOLN: 250.0000 mL | INTRAVENOUS | Status: DC

## 2014-06-09 MED ORDER — HYDROMORPHONE HCL 1 MG/ML IJ SOLN
INTRAMUSCULAR | Status: AC
  Filled 2014-06-09: qty 2

## 2014-06-09 MED ORDER — NICOTINE 21 MG/24HR TD PT24
21.0000 mg | MEDICATED_PATCH | Freq: Every day | TRANSDERMAL | Status: DC
  Administered 2014-06-09: 21 mg via TRANSDERMAL
  Filled 2014-06-09 (×2): qty 1

## 2014-06-09 MED ORDER — MENTHOL 3 MG MT LOZG: 1.0000 | LOZENGE | OROMUCOSAL | Status: DC | PRN

## 2014-06-09 MED ORDER — FENTANYL CITRATE 0.05 MG/ML IJ SOLN
25.0000 ug | INTRAMUSCULAR | Status: DC | PRN
  Administered 2014-06-09 (×3): 50 ug via INTRAVENOUS

## 2014-06-09 MED ORDER — DIAZEPAM 5 MG PO TABS
ORAL_TABLET | ORAL | Status: AC
  Filled 2014-06-09: qty 1

## 2014-06-09 MED ORDER — SODIUM CHLORIDE 0.9 % IJ SOLN: 3.0000 mL | INTRAMUSCULAR | Status: DC | PRN

## 2014-06-09 MED ORDER — CITALOPRAM HYDROBROMIDE 20 MG PO TABS
20.0000 mg | ORAL_TABLET | Freq: Every day | ORAL | Status: DC
  Administered 2014-06-09: 20 mg via ORAL
  Filled 2014-06-09 (×2): qty 1

## 2014-06-09 MED ORDER — ALUM & MAG HYDROXIDE-SIMETH 200-200-20 MG/5ML PO SUSP: 30.0000 mL | Freq: Four times a day (QID) | ORAL | Status: DC | PRN

## 2014-06-09 MED ORDER — POVIDONE-IODINE 7.5 % EX SOLN: Freq: Once | CUTANEOUS | Status: DC

## 2014-06-09 MED ORDER — THROMBIN 20000 UNITS EX SOLR
CUTANEOUS | Status: DC | PRN
  Administered 2014-06-09: 20 mL via TOPICAL

## 2014-06-09 MED ORDER — GLYCOPYRROLATE 0.2 MG/ML IJ SOLN
INTRAMUSCULAR | Status: DC | PRN
  Administered 2014-06-09 (×3): 0.2 mg via INTRAVENOUS

## 2014-06-09 MED ORDER — OXYCODONE HCL 5 MG PO TABS
ORAL_TABLET | ORAL | Status: AC
  Filled 2014-06-09: qty 1

## 2014-06-09 MED ORDER — ROPINIROLE HCL 1 MG PO TABS
1.0000 mg | ORAL_TABLET | Freq: Three times a day (TID) | ORAL | Status: DC
Start: 2014-06-09 — End: 2014-06-10
  Administered 2014-06-09: 1 mg via ORAL
  Filled 2014-06-09 (×4): qty 1

## 2014-06-09 MED ORDER — PHENOL 1.4 % MT LIQD: 1.0000 | OROMUCOSAL | Status: DC | PRN

## 2014-06-09 MED ORDER — SENNOSIDES-DOCUSATE SODIUM 8.6-50 MG PO TABS
1.0000 | ORAL_TABLET | Freq: Every evening | ORAL | Status: DC | PRN
  Filled 2014-06-09: qty 1

## 2014-06-09 MED ORDER — ONDANSETRON HCL 4 MG/2ML IJ SOLN
INTRAMUSCULAR | Status: DC | PRN
  Administered 2014-06-09: 4 mg via INTRAVENOUS

## 2014-06-09 MED ORDER — DIPHENHYDRAMINE HCL 50 MG/ML IJ SOLN
INTRAMUSCULAR | Status: AC
  Filled 2014-06-09: qty 1

## 2014-06-09 MED ORDER — 0.9 % SODIUM CHLORIDE (POUR BTL) OPTIME
TOPICAL | Status: DC | PRN
  Administered 2014-06-09: 1000 mL

## 2014-06-09 MED ORDER — OXYCODONE HCL 5 MG PO TABS
5.0000 mg | ORAL_TABLET | Freq: Once | ORAL | Status: AC | PRN
  Administered 2014-06-09: 5 mg via ORAL

## 2014-06-09 MED ORDER — MIDAZOLAM HCL 5 MG/5ML IJ SOLN
INTRAMUSCULAR | Status: DC | PRN
  Administered 2014-06-09: 2 mg via INTRAVENOUS

## 2014-06-09 MED ORDER — MUPIROCIN 2 % EX OINT
1.0000 "application " | TOPICAL_OINTMENT | Freq: Two times a day (BID) | CUTANEOUS | Status: DC
  Administered 2014-06-09: 1 via NASAL

## 2014-06-09 MED ORDER — SUMATRIPTAN SUCCINATE 100 MG PO TABS: 100.0000 mg | ORAL_TABLET | ORAL | Status: DC | PRN

## 2014-06-09 MED ORDER — ACETAMINOPHEN 650 MG RE SUPP: 650.0000 mg | RECTAL | Status: DC | PRN

## 2014-06-09 MED ORDER — FENTANYL CITRATE 0.05 MG/ML IJ SOLN
INTRAMUSCULAR | Status: AC
Start: 2014-06-09 — End: 2014-06-09
  Administered 2014-06-09: 50 ug via INTRAVENOUS
  Filled 2014-06-09: qty 2

## 2014-06-09 MED ORDER — PNEUMOCOCCAL VAC POLYVALENT 25 MCG/0.5ML IJ INJ
0.5000 mL | INJECTION | INTRAMUSCULAR | Status: DC
  Filled 2014-06-09: qty 0.5

## 2014-06-09 MED ORDER — LACTATED RINGERS IV SOLN
INTRAVENOUS | Status: DC | PRN
  Administered 2014-06-09 (×2): via INTRAVENOUS

## 2014-06-09 MED ORDER — ZOLPIDEM TARTRATE 5 MG PO TABS: 5.0000 mg | ORAL_TABLET | Freq: Every evening | ORAL | Status: DC | PRN

## 2014-06-09 MED ORDER — PROPOFOL 10 MG/ML IV BOLUS
INTRAVENOUS | Status: DC | PRN
  Administered 2014-06-09: 200 mg via INTRAVENOUS

## 2014-06-09 MED ORDER — SODIUM CHLORIDE 0.9 % IJ SOLN
3.0000 mL | Freq: Two times a day (BID) | INTRAMUSCULAR | Status: DC
  Administered 2014-06-09: 3 mL via INTRAVENOUS

## 2014-06-09 MED ORDER — LACTATED RINGERS IV SOLN
INTRAVENOUS | Status: DC
  Administered 2014-06-09: 14:00:00 via INTRAVENOUS

## 2014-06-09 MED ORDER — MORPHINE SULFATE 2 MG/ML IJ SOLN
1.0000 mg | INTRAMUSCULAR | Status: DC | PRN
  Administered 2014-06-09: 4 mg via INTRAVENOUS
  Administered 2014-06-09 – 2014-06-10 (×2): 2 mg via INTRAVENOUS
  Filled 2014-06-09 (×2): qty 1
  Filled 2014-06-09: qty 2

## 2014-06-09 MED ORDER — OXYCODONE HCL 5 MG/5ML PO SOLN: 5.0000 mg | Freq: Once | ORAL | Status: AC | PRN

## 2014-06-09 MED ORDER — ROCURONIUM BROMIDE 100 MG/10ML IV SOLN
INTRAVENOUS | Status: DC | PRN
  Administered 2014-06-09: 50 mg via INTRAVENOUS

## 2014-06-09 MED ORDER — BISACODYL 5 MG PO TBEC
5.0000 mg | DELAYED_RELEASE_TABLET | Freq: Every day | ORAL | Status: DC | PRN
  Filled 2014-06-09: qty 1

## 2014-06-09 MED ORDER — ACETAMINOPHEN 325 MG PO TABS: 650.0000 mg | ORAL_TABLET | ORAL | Status: DC | PRN

## 2014-06-09 MED ORDER — INFLUENZA VAC SPLIT QUAD 0.5 ML IM SUSY
0.5000 mL | PREFILLED_SYRINGE | INTRAMUSCULAR | Status: DC
  Filled 2014-06-09: qty 0.5

## 2014-06-09 MED ORDER — HYDROMORPHONE HCL 1 MG/ML IJ SOLN
INTRAMUSCULAR | Status: AC
  Filled 2014-06-09: qty 1

## 2014-06-09 MED ORDER — HYDROMORPHONE HCL 1 MG/ML IJ SOLN: 0.2500 mg | INTRAMUSCULAR | Status: DC | PRN

## 2014-06-09 MED ORDER — FENTANYL CITRATE 0.05 MG/ML IJ SOLN
50.0000 ug | INTRAMUSCULAR | Status: DC | PRN
  Administered 2014-06-09 (×2): 50 ug via INTRAVENOUS

## 2014-06-09 MED ORDER — ALPRAZOLAM 0.5 MG PO TABS
1.0000 mg | ORAL_TABLET | Freq: Three times a day (TID) | ORAL | Status: DC
  Administered 2014-06-09 – 2014-06-10 (×2): 1 mg via ORAL
  Filled 2014-06-09 (×2): qty 2

## 2014-06-09 MED ORDER — ONDANSETRON 8 MG PO TBDP
8.0000 mg | ORAL_TABLET | Freq: Three times a day (TID) | ORAL | Status: DC | PRN
  Filled 2014-06-09: qty 1

## 2014-06-09 MED ORDER — CALCIUM POLYCARBOPHIL 625 MG PO TABS
625.0000 mg | ORAL_TABLET | Freq: Every day | ORAL | Status: DC
  Filled 2014-06-09: qty 1

## 2014-06-09 MED ORDER — DIAZEPAM 5 MG PO TABS
5.0000 mg | ORAL_TABLET | Freq: Four times a day (QID) | ORAL | Status: DC | PRN
  Administered 2014-06-09 – 2014-06-10 (×3): 5 mg via ORAL
  Filled 2014-06-09 (×2): qty 1

## 2014-06-09 MED ORDER — FENTANYL CITRATE 0.05 MG/ML IJ SOLN
INTRAMUSCULAR | Status: AC
  Administered 2014-06-09: 50 ug via INTRAVENOUS
  Filled 2014-06-09: qty 4

## 2014-06-09 MED ORDER — BUPIVACAINE-EPINEPHRINE 0.25% -1:200000 IJ SOLN
INTRAMUSCULAR | Status: DC | PRN
  Administered 2014-06-09: 2 mL

## 2014-06-09 MED ORDER — ONDANSETRON HCL 4 MG/2ML IJ SOLN: 4.0000 mg | INTRAMUSCULAR | Status: DC | PRN

## 2014-06-09 MED ORDER — DIPHENHYDRAMINE HCL 50 MG/ML IJ SOLN
25.0000 mg | Freq: Once | INTRAMUSCULAR | Status: AC
  Administered 2014-06-09: 25 mg via INTRAVENOUS

## 2014-06-09 MED ORDER — DOCUSATE SODIUM 100 MG PO CAPS
200.0000 mg | ORAL_CAPSULE | Freq: Every day | ORAL | Status: DC
  Administered 2014-06-09: 200 mg via ORAL
  Filled 2014-06-09: qty 2

## 2014-06-09 MED ORDER — BUPIVACAINE-EPINEPHRINE (PF) 0.25% -1:200000 IJ SOLN
INTRAMUSCULAR | Status: AC
  Filled 2014-06-09: qty 30

## 2014-06-09 MED ORDER — LIDOCAINE HCL 4 % MT SOLN
OROMUCOSAL | Status: DC | PRN
  Administered 2014-06-09: 4 mL via TOPICAL

## 2014-06-09 MED ORDER — LIDOCAINE HCL (CARDIAC) 20 MG/ML IV SOLN
INTRAVENOUS | Status: DC | PRN
  Administered 2014-06-09: 80 mg via INTRAVENOUS

## 2014-06-09 MED ORDER — FENTANYL CITRATE 0.05 MG/ML IJ SOLN
INTRAMUSCULAR | Status: DC | PRN
  Administered 2014-06-09: 100 ug via INTRAVENOUS
  Administered 2014-06-09 (×3): 50 ug via INTRAVENOUS

## 2014-06-09 MED ORDER — TEMAZEPAM 15 MG PO CAPS
30.0000 mg | ORAL_CAPSULE | Freq: Every evening | ORAL | Status: DC | PRN
  Administered 2014-06-09: 30 mg via ORAL
  Filled 2014-06-09: qty 2

## 2014-06-09 MED ORDER — THROMBIN 20000 UNITS EX SOLR
CUTANEOUS | Status: AC
  Filled 2014-06-09: qty 20000

## 2014-06-09 MED ORDER — FENTANYL CITRATE 0.05 MG/ML IJ SOLN
INTRAMUSCULAR | Status: AC
  Filled 2014-06-09: qty 5

## 2014-06-09 MED ORDER — HYDROMORPHONE HCL 1 MG/ML IJ SOLN
INTRAMUSCULAR | Status: DC | PRN
  Administered 2014-06-09: 0.5 mg via INTRAVENOUS
  Administered 2014-06-09: 1 mg via INTRAVENOUS
  Administered 2014-06-09: 0.5 mg via INTRAVENOUS

## 2014-06-09 SURGICAL SUPPLY — 73 items
APL SKNCLS STERI-STRIP NONHPOA (GAUZE/BANDAGES/DRESSINGS) ×1
BENZOIN TINCTURE PRP APPL 2/3 (GAUZE/BANDAGES/DRESSINGS) ×3 IMPLANT
BIT DRILL NEURO 2X3.1 SFT TUCH (MISCELLANEOUS) ×1 IMPLANT
BIT DRILL SKYLINE 12MM (BIT) IMPLANT
BLADE SURG 15 STRL LF DISP TIS (BLADE) ×1 IMPLANT
BLADE SURG 15 STRL SS (BLADE)
BLADE SURG ROTATE 9660 (MISCELLANEOUS) ×1 IMPLANT
BUR MATCHSTICK NEURO 3.0 LAGG (BURR) IMPLANT
CANISTER SUCTION WELLS/JOHNSON (MISCELLANEOUS) ×2 IMPLANT
CARTRIDGE OIL MAESTRO DRILL (MISCELLANEOUS) ×1 IMPLANT
CLOSURE WOUND 1/2 X4 (GAUZE/BANDAGES/DRESSINGS) ×1
CORDS BIPOLAR (ELECTRODE) ×3 IMPLANT
COVER SURGICAL LIGHT HANDLE (MISCELLANEOUS) ×3 IMPLANT
CRADLE DONUT ADULT HEAD (MISCELLANEOUS) ×3 IMPLANT
DIFFUSER DRILL AIR PNEUMATIC (MISCELLANEOUS) ×3 IMPLANT
DRAIN JACKSON RD 7FR 3/32 (WOUND CARE) IMPLANT
DRAPE C-ARM 42X72 X-RAY (DRAPES) ×3 IMPLANT
DRAPE POUCH INSTRU U-SHP 10X18 (DRAPES) ×3 IMPLANT
DRAPE SURG 17X23 STRL (DRAPES) ×11 IMPLANT
DRILL BIT SKYLINE 12MM (BIT) ×3
DRILL NEURO 2X3.1 SOFT TOUCH (MISCELLANEOUS) ×3
DURAPREP 26ML APPLICATOR (WOUND CARE) ×3 IMPLANT
ELECT COATED BLADE 2.86 ST (ELECTRODE) ×3 IMPLANT
ELECT REM PT RETURN 9FT ADLT (ELECTROSURGICAL) ×3
ELECTRODE REM PT RTRN 9FT ADLT (ELECTROSURGICAL) ×1 IMPLANT
EVACUATOR SILICONE 100CC (DRAIN) IMPLANT
GAUZE SPONGE 4X4 12PLY STRL (GAUZE/BANDAGES/DRESSINGS) ×3 IMPLANT
GAUZE SPONGE 4X4 16PLY XRAY LF (GAUZE/BANDAGES/DRESSINGS) ×3 IMPLANT
GLOVE BIO SURGEON STRL SZ7 (GLOVE) ×3 IMPLANT
GLOVE BIO SURGEON STRL SZ8 (GLOVE) ×3 IMPLANT
GLOVE BIOGEL PI IND STRL 7.0 (GLOVE) ×2 IMPLANT
GLOVE BIOGEL PI IND STRL 8 (GLOVE) ×1 IMPLANT
GLOVE BIOGEL PI INDICATOR 7.0 (GLOVE) ×2
GLOVE BIOGEL PI INDICATOR 8 (GLOVE) ×2
GOWN STRL REUS W/ TWL LRG LVL3 (GOWN DISPOSABLE) ×1 IMPLANT
GOWN STRL REUS W/ TWL XL LVL3 (GOWN DISPOSABLE) ×1 IMPLANT
GOWN STRL REUS W/TWL LRG LVL3 (GOWN DISPOSABLE) ×9
GOWN STRL REUS W/TWL XL LVL3 (GOWN DISPOSABLE) ×3
INTERLOCK LRDTC CRVCL VBR SM (Bone Implant) IMPLANT
IV CATH 14GX2 1/4 (CATHETERS) ×3 IMPLANT
KIT BASIN OR (CUSTOM PROCEDURE TRAY) ×3 IMPLANT
KIT ROOM TURNOVER OR (KITS) ×3 IMPLANT
LORDOTIC CERVICAL VBR SM 5MM (Bone Implant) ×3 IMPLANT
MANIFOLD NEPTUNE II (INSTRUMENTS) ×1 IMPLANT
NDL SPNL 20GX3.5 QUINCKE YW (NEEDLE) ×1 IMPLANT
NEEDLE 27GAX1X1/2 (NEEDLE) ×3 IMPLANT
NEEDLE SPNL 20GX3.5 QUINCKE YW (NEEDLE) ×3 IMPLANT
NS IRRIG 1000ML POUR BTL (IV SOLUTION) ×3 IMPLANT
OIL CARTRIDGE MAESTRO DRILL (MISCELLANEOUS) ×3
PACK ORTHO CERVICAL (CUSTOM PROCEDURE TRAY) ×3 IMPLANT
PAD ARMBOARD 7.5X6 YLW CONV (MISCELLANEOUS) ×6 IMPLANT
PATTIES SURGICAL .5 X.5 (GAUZE/BANDAGES/DRESSINGS) IMPLANT
PATTIES SURGICAL .5 X1 (DISPOSABLE) ×2 IMPLANT
PIN DISTRACTION 12MM (PIN) ×6
PIN DSTRCT 12XNS SS ACIS (PIN) IMPLANT
PLATE SKYLINE 12MM (Plate) ×2 IMPLANT
PUTTY BONE DBX 2.5 MIS (Bone Implant) ×2 IMPLANT
SCREW VARIABLE SELF TAP 12MM (Screw) ×12 IMPLANT
SPONGE INTESTINAL PEANUT (DISPOSABLE) ×5 IMPLANT
SPONGE SURGIFOAM ABS GEL 100 (HEMOSTASIS) ×2 IMPLANT
STRIP CLOSURE SKIN 1/2X4 (GAUZE/BANDAGES/DRESSINGS) ×2 IMPLANT
SURGIFLO TRUKIT (HEMOSTASIS) IMPLANT
SUT MNCRL AB 4-0 PS2 18 (SUTURE) ×2 IMPLANT
SUT SILK 4 0 (SUTURE)
SUT SILK 4-0 18XBRD TIE 12 (SUTURE) IMPLANT
SUT VIC AB 2-0 CT2 18 VCP726D (SUTURE) ×3 IMPLANT
SYR BULB IRRIGATION 50ML (SYRINGE) ×3 IMPLANT
SYR CONTROL 10ML LL (SYRINGE) ×6 IMPLANT
TAPE CLOTH 4X10 WHT NS (GAUZE/BANDAGES/DRESSINGS) ×1 IMPLANT
TAPE UMBILICAL COTTON 1/8X30 (MISCELLANEOUS) ×3 IMPLANT
TOWEL OR 17X24 6PK STRL BLUE (TOWEL DISPOSABLE) ×3 IMPLANT
TOWEL OR 17X26 10 PK STRL BLUE (TOWEL DISPOSABLE) ×3 IMPLANT
YANKAUER SUCT BULB TIP NO VENT (SUCTIONS) ×3 IMPLANT

## 2014-06-09 NOTE — Transfer of Care (Signed)
Immediate Anesthesia Transfer of Care Note  Patient: Stephanie Gregory  Procedure(s) Performed: Procedure(s) with comments: ANTERIOR CERVICAL DECOMPRESSION/DISCECTOMY FUSION 1 LEVEL (N/A) - Anterior cervical decompression fusion, cervical 3-4 with instrumentation and allograft  Patient Location: PACU  Anesthesia Type:General  Level of Consciousness: awake, oriented, sedated, patient cooperative and responds to stimulation  Airway & Oxygen Therapy: Patient Spontanous Breathing and Patient connected to nasal cannula oxygen  Post-op Assessment: Report given to RN, Post -op Vital signs reviewed and stable, Patient moving all extremities and Patient moving all extremities X 4  Post vital signs: Reviewed and stable  Last Vitals:  Filed Vitals:   06/09/14 1243  BP: 104/74  Pulse: 73  Temp: 36.6 C  Resp: 18    Complications: No apparent anesthesia complications

## 2014-06-09 NOTE — Anesthesia Preprocedure Evaluation (Addendum)
Anesthesia Evaluation  Patient identified by MRN, date of birth, ID band Patient awake    Reviewed: Allergy & Precautions, NPO status , Patient's Chart, lab work & pertinent test results, reviewed documented beta blocker date and time   Airway Mallampati: I  TM Distance: >3 FB Neck ROM: Full    Dental  (+) Upper Dentures, Lower Dentures   Pulmonary Current Smoker,  breath sounds clear to auscultation        Cardiovascular Rhythm:Regular Rate:Normal  EKG 01/2014 OK   Neuro/Psych Anxiety Depression    GI/Hepatic Neg liver ROS, GERD-  Medicated,  Endo/Other  negative endocrine ROS  Renal/GU negative Renal ROS     Musculoskeletal   Abdominal   Peds  Hematology 13/40   Anesthesia Other Findings   Reproductive/Obstetrics                          Anesthesia Physical Anesthesia Plan  ASA: II  Anesthesia Plan: General   Post-op Pain Management:    Induction: Intravenous  Airway Management Planned: Oral ETT  Additional Equipment:   Intra-op Plan:   Post-operative Plan: Extubation in OR  Informed Consent: I have reviewed the patients History and Physical, chart, labs and discussed the procedure including the risks, benefits and alternatives for the proposed anesthesia with the patient or authorized representative who has indicated his/her understanding and acceptance.   Dental advisory given  Plan Discussed with: CRNA, Anesthesiologist and Surgeon  Anesthesia Plan Comments: (Intubation 7.0, grade 1 2015)       Anesthesia Quick Evaluation

## 2014-06-09 NOTE — Anesthesia Postprocedure Evaluation (Signed)
Anesthesia Post Note  Patient: Stephanie Gregory  Procedure(s) Performed: Procedure(s) (LRB): ANTERIOR CERVICAL DECOMPRESSION/DISCECTOMY FUSION 1 LEVEL (N/A)  Anesthesia type: general  Patient location: PACU  Post pain: Pain level controlled  Post assessment: Patient's Cardiovascular Status Stable  Last Vitals:  Filed Vitals:   06/09/14 1915  BP: 118/72  Pulse: 66  Temp:   Resp: 18    Post vital signs: Reviewed and stable  Level of consciousness: sedated  Complications: No apparent anesthesia complications

## 2014-06-10 ENCOUNTER — Encounter (HOSPITAL_COMMUNITY): Payer: Self-pay | Admitting: Orthopedic Surgery

## 2014-06-10 DIAGNOSIS — M5011 Cervical disc disorder with radiculopathy,  high cervical region: Secondary | ICD-10-CM | POA: Diagnosis not present

## 2014-06-10 NOTE — Progress Notes (Signed)
    Patient doing well Patient reports resolution of left shoulder pain + posterior neck discomfort   Physical Exam: Filed Vitals:   06/10/14 0400  BP: 108/60  Pulse: 54  Temp: 97.6 F (36.4 C)  Resp: 18    Dressing in place NVI  POD #1 s/p C3/4 ACDF, doing great  - encourage ambulation - Percocet for pain, Valium for muscle spasms - likely d/c home today, f/u 2 weeks

## 2014-06-10 NOTE — Progress Notes (Signed)
Pt given D/C instructions with Rx's, verbal understanding was provided. Pt's IV was removed prior to D/C. Pt's incision is clean and dry with no sign of infection. Pt D/C'd home via wheelchair @1015  per MD order. Pt is stable @ D/C and has no other needs at this time. Holli Humbles, RN

## 2014-06-10 NOTE — Op Note (Signed)
Stephanie Gregory, Stephanie Gregory NO.:  [id number]  MEDICAL RECORD NO.:  [id number]  LOCATION:  3C08C                        FACILITY:  [facility]  PHYSICIAN:  Phylliss Bob, MD      DATE OF BIRTH:  1965/07/30  DATE OF PROCEDURE:  06/09/2014                              OPERATIVE REPORT   PREOPERATIVE DIAGNOSIS: 1. C3-4 degenerative disc disease, severe. 2. Left-sided C4 radiculopathy.  POSTOPERATIVE DIAGNOSIS: 1. C3-4 degenerative disc disease, severe. 2. Left-sided C4 radiculopathy.  PROCEDURES: 1. Anterior cervical decompression and fusion C3-4. 2. Placement of anterior instrumentation, C3-4. 3. Insertion of interbody device x1 (5 mm lordotic intervertebral     spacer). 4. Use of morselized allograft-DBX mix. 5. Intraoperative use of fluoroscopy.  SURGEON:  Phylliss Bob, MD  ASSISTANT:  Pricilla Holm, PA-C.  ANESTHESIA:  General endotracheal anesthesia.  COMPLICATIONS:  None.  DISPOSITION:  Stable.  ESTIMATED BLOOD LOSS:  Minimal.  INDICATIONS FOR SURGERY:  Briefly, [name] is a 49 year old female who is status post both a C4-5 anterior cervical fusion, followed by a C5-C7 fusion.  She did well from both surgeries, but did go on to have pain in her left shoulder consistent with left C4 radiculopathy. Radiographs did reveal substantial degenerative disc disease at C3-4 with a retrolisthesis.  I did get the patient set up with a left C4 diagnostic nerve block.  The injection did entirely take away the patient's pain temporarily.  We therefore did discuss proceeding with a C3-4 ACDF.  The patient did elect to proceed.  The patient did fully understand the risks and limitations of the procedure.  OPERATIVE DETAILS:  On June 09, 2014, the patient was brought to surgery and general endotracheal anesthesia was administered.  The patient was placed supine on a hospital bed.  The patient's arms were secured to her sides.  All bony prominences were meticulously  padded.  The neck was prepped and draped.  A time-out was performed.  A right-sided transverse incision was made.  A Smith-Robinson approach was used, and the anterior spine was noted.  A self-retaining retractor was placed.  Caspar pins were placed into the C3 and C4 vertebral bodies and distraction was applied.  I then removed the anterior osteophytes and then I proceeded with a thorough C3-4 intervertebral diskectomy.  The posterior longitudinal ligament was identified and entered using a nerve hook.  I then used #1, followed by #2 Kerrison to perform a thorough left-sided neural foraminal decompression.  I was very pleased with the decompression.  The endplates were then prepared.  I then placed a series of trials and I did feel that a 5 mm lordotic intervertebral spacer would be the most appropriate fit.  The appropriate size interbody spacer was then packed with DBX mix and tamped into position. The Caspar pins were then removed and bone wax was placed in their place.  A 12-mm anterior plate was placed over the anterior spine.  A 12 mm screws were placed, 2 in each vertebral body at C3 and C4.  The screws were then locked to the plate using the CAM locking mechanism.  I was very pleased with the final AP and lateral fluoroscopic images.  The wound was then copiously irrigated.  The platysma was then closed using 2-0 Vicryl, and the skin was closed using 3-0 Monocryl.  Of note, Pricilla Holm was my assistant throughout surgery and did aid in retraction, suctioning, and closure.     Phylliss Bob, MD     MD/MEDQ  D:  06/09/2014  T:  06/10/2014  Job:  [id number]  cc:   Tamsen Roers, MD Adella Nissen, MD

## 2014-06-11 NOTE — Addendum Note (Signed)
Addendum  created 06/11/14 1037 by Napoleon Form, MD   Modules edited: Anesthesia Attestations

## 2014-06-17 NOTE — Discharge Summary (Signed)
Patient ID: Stephanie Gregory MRN: [id number] DOB/AGE: 47-19-1967 47 y.o.  Admit date: 06/09/2014 Discharge date: 06/10/2014  Admission Diagnoses:  Active Problems:   Radiculopathy   Discharge Diagnoses:  Same  Past Medical History  Diagnosis Date  . Anxiety   . GERD (gastroesophageal reflux disease)   . Migraine   . Mental disorder     Depression / Anxiety  . Vaginal Pap smear, abnormal     CIN I - History  . SVD (spontaneous vaginal delivery)     x 3  . Depression   . DDD (degenerative disc disease)     has metal plate C4-C5 diskectomy    Surgeries: Procedure(s): ANTERIOR CERVICAL DECOMPRESSION/DISCECTOMY FUSION 1 LEVEL C3-4 on 06/09/2014   Consultants:  None  Discharged Condition: Improved  Hospital Course: Stephanie Gregory is an 49 y.o. female who was admitted 06/09/2014 for operative treatment of radiculopathy. Patient has severe unremitting pain that affects sleep, daily activities, and work/hobbies. After pre-op clearance the patient was taken to the operating room on 06/09/2014 and underwent  Procedure(s): ANTERIOR CERVICAL DECOMPRESSION/DISCECTOMY FUSION 1 LEVEL C3-4.    Patient was given perioperative antibiotics:  Anti-infectives    Start     Dose/Rate Route Frequency Ordered Stop   06/09/14 2200  ceFAZolin (ANCEF) IVPB 1 g/50 mL premix  Status:  Discontinued     1 g 100 mL/hr over 30 Minutes Intravenous Every 8 hours 06/09/14 1950 06/10/14 1329   06/09/14 0600  ceFAZolin (ANCEF) IVPB 2 g/50 mL premix     2 g 100 mL/hr over 30 Minutes Intravenous On call to O.R. 06/08/14 1308 06/09/14 1638       Patient was given sequential compression devices, early ambulation to prevent DVT.  Patient benefited maximally from hospital stay and there were no complications.    Recent vital signs: BP 129/49 mmHg  Pulse 50  Temp(Src) 97.6 F (36.4 C) (Oral)  Resp 16  Wt 53.071 kg (117 lb)  SpO2 100%  LMP 06/09/2013   Discharge Medications:     Medication List      TAKE these medications        ALPRAZolam 1 MG tablet  Commonly known as:  XANAX  Take 1 mg by mouth 3 (three) times daily.     citalopram 20 MG tablet  Commonly known as:  CELEXA  Take 20 mg by mouth daily.     docusate sodium 100 MG capsule  Commonly known as:  COLACE  Take 200 mg by mouth at bedtime.     estradiol 1 MG tablet  Commonly known as:  ESTRACE  Take 1 mg by mouth daily.     nicotine 21 mg/24hr patch  Commonly known as:  NICODERM CQ - dosed in mg/24 hours  Place 21 mg onto the skin daily.     ondansetron 8 MG disintegrating tablet  Commonly known as:  ZOFRAN-ODT  Take 8 mg by mouth every 8 (eight) hours as needed for nausea or vomiting.     oxyCODONE-acetaminophen 10-325 MG per tablet  Commonly known as:  PERCOCET  Take 1 tablet by mouth every 6 (six) hours as needed for pain.     polycarbophil 625 MG tablet  Commonly known as:  FIBERCON  Take 625 mg by mouth daily.     rizatriptan 10 MG disintegrating tablet  Commonly known as:  MAXALT-MLT  Take 10 mg by mouth as needed for migraine. May repeat in 2 hours if needed  rOPINIRole 1 MG tablet  Commonly known as:  REQUIP  Take 1 mg by mouth 3 (three) times daily.     SUMAtriptan 100 MG tablet  Commonly known as:  IMITREX  Take 100 mg by mouth every 2 (two) hours as needed for migraine or headache. May repeat in 2 hours if headache persists or recurs.     temazepam 30 MG capsule  Commonly known as:  RESTORIL  Take 30 mg by mouth at bedtime as needed for sleep.     traZODone 100 MG tablet  Commonly known as:  DESYREL  Take 3 tablets (300 mg total) by mouth at bedtime. For sleep.        Diagnostic Studies: Dg Cervical Spine 2-3 Views  06/09/2014   CLINICAL DATA:  ACDF C3-C4  EXAM: DG C-ARM 61-120 MIN; CERVICAL SPINE - 2-3 VIEW  TECHNIQUE: Two digital C-arm fluoroscopic images obtained intraoperatively are compared intraoperative images of 02/03/2014  CONTRAST:  None utilized  FLUOROSCOPY TIME:   Radiation Exposure Index (as provided by the fluoroscopic device): Not available  If the device does not provide the exposure index:  Fluoroscopy Time (in minutes and seconds):  0 minutes 17 seconds  Number of Acquired Images:  2  COMPARISON:  02/03/2014  FINDINGS: Osseous demineralization.  Anterior surgical sponges.  New anterior plate and screws identified at C3-C4 with new intervening disc prosthesis.  Inferior cervical hardware appears unchanged from previous exam.  Bones demineralized.  IMPRESSION: New anterior cervical fusion at C3-C4.   Electronically Signed   By: Lavonia Dana M.D.   On: 06/09/2014 19:08   Dg C-arm 1-60 Min  06/09/2014   CLINICAL DATA:  ACDF C3-C4  EXAM: DG C-ARM 61-120 MIN; CERVICAL SPINE - 2-3 VIEW  TECHNIQUE: Two digital C-arm fluoroscopic images obtained intraoperatively are compared intraoperative images of 02/03/2014  CONTRAST:  None utilized  FLUOROSCOPY TIME:  Radiation Exposure Index (as provided by the fluoroscopic device): Not available  If the device does not provide the exposure index:  Fluoroscopy Time (in minutes and seconds):  0 minutes 17 seconds  Number of Acquired Images:  2  COMPARISON:  02/03/2014  FINDINGS: Osseous demineralization.  Anterior surgical sponges.  New anterior plate and screws identified at C3-C4 with new intervening disc prosthesis.  Inferior cervical hardware appears unchanged from previous exam.  Bones demineralized.  IMPRESSION: New anterior cervical fusion at C3-C4.   Electronically Signed   By: Lavonia Dana M.D.   On: 06/09/2014 19:08    Disposition: 01-Home or Self Care   POD #1 s/p C3/4 ACDF, doing great  - encourage ambulation - Percocet for pain, Valium for muscle spasms -Written scripts for pain signed and in chart -D/C instructions sheet printed and in chart -D/C today  -F/U in office 2 weeks   Signed: Justice Britain 06/17/2014, 8:51 AM

## 2014-07-23 ENCOUNTER — Ambulatory Visit: Payer: BLUE CROSS/BLUE SHIELD | Admitting: Podiatry

## 2014-08-10 ENCOUNTER — Ambulatory Visit: Payer: BLUE CROSS/BLUE SHIELD | Admitting: Podiatry

## 2014-08-18 ENCOUNTER — Ambulatory Visit: Payer: BLUE CROSS/BLUE SHIELD | Admitting: Podiatry

## 2014-09-03 ENCOUNTER — Ambulatory Visit (INDEPENDENT_AMBULATORY_CARE_PROVIDER_SITE_OTHER): Payer: BLUE CROSS/BLUE SHIELD | Admitting: Podiatry

## 2014-09-03 ENCOUNTER — Ambulatory Visit (INDEPENDENT_AMBULATORY_CARE_PROVIDER_SITE_OTHER): Payer: BLUE CROSS/BLUE SHIELD

## 2014-09-03 ENCOUNTER — Encounter: Payer: Self-pay | Admitting: Podiatry

## 2014-09-03 VITALS — BP 114/69 | HR 82 | Resp 18

## 2014-09-03 DIAGNOSIS — M2012 Hallux valgus (acquired), left foot: Secondary | ICD-10-CM

## 2014-09-03 DIAGNOSIS — R52 Pain, unspecified: Secondary | ICD-10-CM

## 2014-09-03 NOTE — Progress Notes (Signed)
   Subjective:    Patient ID: Stephanie Gregory, female    DOB: 12-Nov-1965, 49 y.o.   MRN: [id number]  HPI  49 year old female presents the office with complaints of painful bunion to her left foot. She says the bodies been present for quite some time however the last 2 months is become more tender. She states that she has tried shoe gear modifications and padding without any resolution. She states the area continues to be painful and at times swells and becomes red due to irritation. At this time she is requesting surgical intervention to help decrease her pain and deformity. No other complaints at this time.  Review of Systems  All other systems reviewed and are negative.      Objective:   Physical Exam AAO x3, NAD DP/PT pulses palpable bilaterally, CRT less than 3 seconds Protective sensation intact with Simms Weinstein monofilament, vibratory sensation intact, Achilles tendon reflex intact There is a moderate structural HAV deformity present on the left side. There is also deformity to the right side however is not as significant of the left. On the left there is mild irritation overlying the first tarsal head medially from irritation shoe gear. There is tenderness palpation overlying this prominence. There is no pain or crepitation with first MTPJ range of motion. There is no hypermobility present. No other areas of tenderness to bilateral lower extremities. MMT 5/5, ROM WNL.  No open lesions or pre-ulcerative lesions.  No overlying edema, erythema, increase in warmth to bilateral lower extremities.  No pain with calf compression, swelling, warmth, erythema bilaterally.     Assessment & Plan:  49 year old female with symptomatic HAV left foot -X-rays were obtained and reviewed with the patient.  -Treatment options discussed including all alternatives, risks, and complications -At this time the patient also requests a sterile injection into and around the first MTPJ due to pain. Risks and  competitions the injection were discussed the patient for which she understands verbally consents. Under sterile conditions a total 1 mL mixture of Dexamethasone phosphate in 2% lidocaine plain was infiltrated into and around the first MTPJ without complications. A band was applied. Postinjection care was discussed the patient. -Surgical intervention was discussed the patient which included an Liane Comber bunionectomy with screw/wire fixation. -The incision placement as well as the postoperative course was discussed with the patient. I discussed risks of the surgery which include, but not limited to, infection, bleeding, pain, swelling, need for further surgery, delayed or nonhealing, painful or ugly scar, numbness or sensation changes, over/under correction, recurrence, transfer lesions, further deformity, hardware failure, DVT/PE, loss of toe/foot. Patient understands these risks and wishes to proceed with surgery. The surgical consent was reviewed with the patient all 3 pages were signed. No promises or guarantees were given to the outcome of the procedure. All questions were answered to the best of my ability. Before the surgery the patient was encouraged to call the office if there is any further questions. The surgery will be performed at the [facility] on an outpatient basis.

## 2014-09-03 NOTE — Patient Instructions (Signed)
Pre-Operative Instructions  Congratulations, you have decided to take an important step to improving your quality of life.  You can be assured that the doctors of [facility] will be with you every step of the way.  1. Plan to be at the surgery center/hospital at least 1 (one) hour prior to your scheduled time unless otherwise directed by the surgical center/hospital staff.  You must have a responsible adult accompany you, remain during the surgery and drive you home.  Make sure you have directions to the surgical center/hospital and know how to get there on time. 2. For hospital based surgery you will need to obtain a history and physical form from your family physician within 1 month prior to the date of surgery- we will give you a form for you primary physician.  3. We make every effort to accommodate the date you request for surgery.  There are however, times where surgery dates or times have to be moved.  We will contact you as soon as possible if a change in schedule is required.   4. No Aspirin/Ibuprofen for one week before surgery.  If you are on aspirin, any non-steroidal anti-inflammatory medications (Mobic, Aleve, Ibuprofen) you should stop taking it 7 days prior to your surgery.  You make take Tylenol  For pain prior to surgery.  5. Medications- If you are taking daily heart and blood pressure medications, seizure, reflux, allergy, asthma, anxiety, pain or diabetes medications, make sure the surgery center/hospital is aware before the day of surgery so they may notify you which medications to take or avoid the day of surgery. 6. No food or drink after midnight the night before surgery unless directed otherwise by surgical center/hospital staff. 7. No alcoholic beverages 24 hours prior to surgery.  No smoking 24 hours prior to or 24 hours after surgery. 8. Wear loose pants or shorts- loose enough to fit over bandages, boots, and casts. 9. No slip on shoes, sneakers are best. 10. Bring  your boot with you to the surgery center/hospital.  Also bring crutches or a walker if your physician has prescribed it for you.  If you do not have this equipment, it will be provided for you after surgery. 11. If you have not been contracted by the surgery center/hospital by the day before your surgery, call to confirm the date and time of your surgery. 12. Leave-time from work may vary depending on the type of surgery you have.  Appropriate arrangements should be made prior to surgery with your employer. 13. Prescriptions will be provided immediately following surgery by your doctor.  Have these filled as soon as possible after surgery and take the medication as directed. 14. Remove nail polish on the operative foot. 15. Wash the night before surgery.  The night before surgery wash the foot and leg well with the antibacterial soap provided and water paying special attention to beneath the toenails and in between the toes.  Rinse thoroughly with water and dry well with a towel.  Perform this wash unless told not to do so by your physician.  Enclosed: 1 Ice pack (please put in freezer the night before surgery)   1 Hibiclens skin cleaner   Pre-op Instructions  If you have any questions regarding the instructions, do not hesitate to call our office.  [place]: [address] [place], [place] [address] [phone]  [place]: [address] [place], [place] [address] [phone]  [place]: [address]  [place],  [address] [phone]  Dr. Richard   Tuchman DPM, Dr. Norman Regal DPM Dr. Richard Sikora DPM, Dr. M. Todd Hyatt DPM, Dr. Kathryn Egerton DPM, Dr. Tahlia Deamer DPM 

## 2014-09-07 ENCOUNTER — Telehealth: Payer: Self-pay | Admitting: *Deleted

## 2014-09-07 NOTE — Telephone Encounter (Signed)
Left message with Mr. Florentino to have pt callback to reschedule her 10/04/2014 Monday surgery to a Wednesday.

## 2014-09-09 NOTE — Telephone Encounter (Signed)
"  Please call regarding my surgery on 10/06/2014."    I returned her call.  "I just wanted to make sure you all informed the surgical center about the change.  When I spoke to them they were not aware."  Yes, I spoke with the surgical center this morning.  They are aware.  "Okay, thank you."

## 2014-09-17 ENCOUNTER — Ambulatory Visit: Payer: Self-pay | Admitting: Sports Medicine

## 2014-09-20 ENCOUNTER — Other Ambulatory Visit: Payer: Self-pay | Admitting: *Deleted

## 2014-09-20 DIAGNOSIS — C210 Malignant neoplasm of anus, unspecified: Secondary | ICD-10-CM

## 2014-09-20 NOTE — [facility] Oncology Navigator Note (Signed)
Dr. Benay Spice received call from Dr. Collene Mares requesting patient be seen soon for anal cancer. Provided contact [phone] or [phone]. Placed referral in EPIC and requesting records asap to make appointment.

## 2014-09-22 ENCOUNTER — Telehealth: Payer: Self-pay | Admitting: *Deleted

## 2014-09-22 NOTE — Telephone Encounter (Addendum)
09/21/2014  "I have surgery scheduled on the 29th.  I was diagnosed with cancer on Monday.  I had a colonoscopy.  I want to make sure I can have my surgery on the 29th.  Give me a call."   I left messages for patient to give me a call back.  I need to discuss cancellation of her surgery due to recent health issues.

## 2014-09-23 ENCOUNTER — Telehealth: Payer: Self-pay | Admitting: Podiatry

## 2014-09-23 ENCOUNTER — Encounter: Payer: Self-pay | Admitting: Sports Medicine

## 2014-09-23 ENCOUNTER — Ambulatory Visit (INDEPENDENT_AMBULATORY_CARE_PROVIDER_SITE_OTHER): Payer: BLUE CROSS/BLUE SHIELD | Admitting: Sports Medicine

## 2014-09-23 ENCOUNTER — Telehealth: Payer: Self-pay | Admitting: *Deleted

## 2014-09-23 VITALS — BP 123/80 | HR 79 | Ht 63.0 in | Wt 111.0 lb

## 2014-09-23 DIAGNOSIS — F321 Major depressive disorder, single episode, moderate: Secondary | ICD-10-CM | POA: Diagnosis not present

## 2014-09-23 DIAGNOSIS — G43009 Migraine without aura, not intractable, without status migrainosus: Secondary | ICD-10-CM | POA: Insufficient documentation

## 2014-09-23 DIAGNOSIS — C210 Malignant neoplasm of anus, unspecified: Secondary | ICD-10-CM

## 2014-09-23 DIAGNOSIS — F17200 Nicotine dependence, unspecified, uncomplicated: Secondary | ICD-10-CM | POA: Insufficient documentation

## 2014-09-23 DIAGNOSIS — G2581 Restless legs syndrome: Secondary | ICD-10-CM

## 2014-09-23 DIAGNOSIS — Z720 Tobacco use: Secondary | ICD-10-CM | POA: Diagnosis not present

## 2014-09-23 DIAGNOSIS — Z0000 Encounter for general adult medical examination without abnormal findings: Secondary | ICD-10-CM | POA: Insufficient documentation

## 2014-09-23 DIAGNOSIS — M5030 Other cervical disc degeneration, unspecified cervical region: Secondary | ICD-10-CM

## 2014-09-23 DIAGNOSIS — Z79890 Hormone replacement therapy (postmenopausal): Secondary | ICD-10-CM

## 2014-09-23 MED ORDER — SUMATRIPTAN SUCCINATE 100 MG PO TABS: 100.0000 mg | ORAL_TABLET | ORAL | Status: DC | PRN

## 2014-09-23 MED ORDER — VENLAFAXINE HCL ER 150 MG PO CP24: ORAL_CAPSULE | ORAL | Status: DC

## 2014-09-23 MED ORDER — LINACLOTIDE 145 MCG PO CAPS: 145.0000 ug | ORAL_CAPSULE | Freq: Every day | ORAL | Status: DC

## 2014-09-23 MED ORDER — VENLAFAXINE HCL ER 75 MG PO CP24: ORAL_CAPSULE | ORAL | Status: DC

## 2014-09-23 MED ORDER — PREGABALIN 300 MG PO CAPS: 300.0000 mg | ORAL_CAPSULE | Freq: Two times a day (BID) | ORAL | Status: DC

## 2014-09-23 MED ORDER — HYDROXYZINE PAMOATE 50 MG PO CAPS: 50.0000 mg | ORAL_CAPSULE | Freq: Four times a day (QID) | ORAL | Status: DC | PRN

## 2014-09-23 MED ORDER — TRAZODONE HCL 300 MG PO TABS: 300.0000 mg | ORAL_TABLET | Freq: Every day | ORAL | Status: DC

## 2014-09-23 MED ORDER — NICOTINE 21 MG/24HR TD PT24: MEDICATED_PATCH | TRANSDERMAL | Status: DC

## 2014-09-23 MED ORDER — TOPIRAMATE 50 MG PO TABS: ORAL_TABLET | ORAL | Status: DC

## 2014-09-23 MED ORDER — ESTRADIOL 1 MG PO TABS: 1.0000 mg | ORAL_TABLET | Freq: Every day | ORAL | Status: DC

## 2014-09-23 MED ORDER — ROPINIROLE HCL 1 MG PO TABS: 1.0000 mg | ORAL_TABLET | Freq: Every day | ORAL | Status: AC

## 2014-09-23 NOTE — Assessment & Plan Note (Signed)
Adding nicotine replacement patches.

## 2014-09-23 NOTE — Assessment & Plan Note (Signed)
Has 3-4 migraines per week. Adding topiramate. Keep migraine diary. Refilling Imitrex.

## 2014-09-23 NOTE — Assessment & Plan Note (Signed)
Currently managed at [facility] surgery. Excision is coming up. She does take Linzess, I am going to refill this and give her a discount coupon.

## 2014-09-23 NOTE — Assessment & Plan Note (Signed)
Refilling Requip

## 2014-09-23 NOTE — Assessment & Plan Note (Signed)
Post hysterectomy and nephrectomy. We discussed the risks and benefits of hormone replacement therapy. Refilling estradiol. She is due for mammogram.

## 2014-09-23 NOTE — Assessment & Plan Note (Signed)
Continue Effexor. Continue trazodone. Continue Xanax as needed, she uses approximately 90 per month.

## 2014-09-23 NOTE — Telephone Encounter (Signed)
Pt called and was dx with cancer and would like to hold off on surgery and will call back to reschedule once she gets her cancer taken care of.

## 2014-09-23 NOTE — Telephone Encounter (Signed)
Ok

## 2014-09-23 NOTE — Assessment & Plan Note (Signed)
Checking routine bloodwork. 

## 2014-09-23 NOTE — Assessment & Plan Note (Addendum)
Post ACDF with persistent axial pain. Next line this is currently managed by [facility] orthopedics. I am happy to refill her Lyrica

## 2014-09-23 NOTE — Telephone Encounter (Signed)
I called and informed Caren Griffins at [facility] that patient's surgery needs to be canceled for 10/06/2014.

## 2014-09-23 NOTE — Progress Notes (Signed)
  Subjective:    CC: Establish care.   HPI:  Cervical degenerative disc disease: Post cervical ACDF, currently undergoing further injections at [facility]. Pain management and injections will be managed by [facility] orthopedics.  Major depression: Controlled with Xanax 3 times a day and Effexor 225 mg daily as well as trazodone 300 mg at bedtime.  Restless leg syndrome: Predominate right upper extremity, epidurals have not been effective for this, and she does have left-sided radiculopathy, interestingly Requip prescribed by another doctor was very effective.  Smoker: Desires to do nicotine patches.  Status post hysterectomy nephrectomy, now with surgical menopause, currently on hormone replacement therapy.  Migraine headaches: Moderate, persistent, migraines several times per week, she is not on any migraine preventative medication.  Anal cancer: Does have surgical excision coming up. She is using a stool softener, Linzess, needs a refill.  Past medical history, Surgical history, Family history not pertinant except as noted below, Social history, Allergies, and medications have been entered into the medical record, reviewed, and no changes needed.   Review of Systems: No headache, visual changes, nausea, vomiting, diarrhea, constipation, dizziness, abdominal pain, skin rash, fevers, chills, night sweats, swollen lymph nodes, weight loss, chest pain, body aches, joint swelling, muscle aches, shortness of breath, mood changes, visual or auditory hallucinations.  Objective:    General: Well Developed, well nourished, and in no acute distress.  Neuro: Alert and oriented x3, extra-ocular muscles intact, sensation grossly intact.  HEENT: Normocephalic, atraumatic, pupils equal round reactive to light, neck supple, no masses, no lymphadenopathy, thyroid nonpalpable.  Skin: Warm and dry, no rashes noted.  Cardiac: Regular rate and rhythm, no murmurs rubs or gallops.  Respiratory:  Clear to auscultation bilaterally. Not using accessory muscles, speaking in full sentences.  Abdominal: Soft, nontender, nondistended, positive bowel sounds, no masses, no organomegaly.  Musculoskeletal: Shoulder, elbow, wrist, hip, knee, ankle stable, and with full range of motion.  Impression and Recommendations:    The patient was counselled, risk factors were discussed, anticipatory guidance given.  I spent 60 minutes with this patient, 50% was face-to-face time counseling regarding the numerous above and below diagnoses.

## 2014-09-23 NOTE — Telephone Encounter (Signed)
"  I received your message about rescheduling my surgery.  Give me a call back."    I'm returning your call.  "Yes, I found out I have Cancer and they told me to hold off on having the surgery for now.  Is that okay?"  Of course that's okay.  We wish you well.  Just call and let us know when you're ready.  Take care of yourself.  "I will thank you.  I'm going to have surgery.  They said it hadn't spread.  I'm going to stay away from heels in the meantime for my feet."

## 2014-09-26 ENCOUNTER — Telehealth: Payer: Self-pay | Admitting: Internal Medicine

## 2014-09-26 NOTE — Telephone Encounter (Signed)
Pt called and states she was just diagnosed with distal rectal versus anal cancer on 09/20/2014 by Dr. Collene Mares Reports constipation and no BM x 3 days Took Mg Citrate x 1 bottle and no movement, now feels bloated, lower abd pressure, the need to have BM, but no success No fever, no N/V She is passing BRBPR, but reports this is not new No presyncopal symptoms, dyspnea or CP  My rec is ED is no BM soon, worsening pain, fever, continued bleeding Also I recommended glycerin suppository x 1 and if no relieve fleets enema x 1.  We discussed administration of enema given known distal rectal vs. Anal canal mass.  She was instructed to be very gentle with insertion and to stop if any pain or difficulty.  If this, then ED visit advised. Will alert Dr. Collene Mares

## 2014-09-27 ENCOUNTER — Telehealth: Payer: Self-pay | Admitting: *Deleted

## 2014-09-27 NOTE — Telephone Encounter (Signed)
After review of records/path report called Lattie Haw at Dr. Lorie Apley office and confirmed that referral to medical oncology is not necessary.

## 2014-09-30 ENCOUNTER — Telehealth: Payer: Self-pay | Admitting: *Deleted

## 2014-09-30 DIAGNOSIS — G43001 Migraine without aura, not intractable, with status migrainosus: Secondary | ICD-10-CM

## 2014-09-30 MED ORDER — SUMATRIPTAN SUCCINATE 6 MG/0.5ML ~~LOC~~ SOAJ: SUBCUTANEOUS | Status: DC

## 2014-09-30 MED ORDER — ALPRAZOLAM 1 MG PO TABS: 1.0000 mg | ORAL_TABLET | Freq: Every day | ORAL | Status: DC | PRN

## 2014-09-30 NOTE — Telephone Encounter (Signed)
Pt called today asking if you would send the imitrex injections for her instead of her taking the oral form.  Also she asked if you'd send a refill on the xanax that's on her med list.  Please advise.

## 2014-09-30 NOTE — Telephone Encounter (Signed)
Rx in box, imitrex injector called in.

## 2014-10-01 NOTE — Telephone Encounter (Signed)
Tried calling pt multiple times this morning but line was busy.  Will try again later.

## 2014-10-06 ENCOUNTER — Encounter: Payer: Self-pay | Admitting: General Surgery

## 2014-10-06 NOTE — Progress Notes (Signed)
Stephanie Gregory 10/06/2014 11:17 AM Location: [facility] Patient #: [id number] DOB: 03/04/1966 Single / Language: [name] / Race: White Female History of Present Illness Stephanie Hollingshead MD; 10/06/2014 12:43 PM) Patient words: squamous cell carcinoma rectum.  The patient is a 49 year old female    Note:She is referred by Dr. 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 She's been having rectal bleeding off and on for approximately 2 years which is felt to be secondary to hemorrhoidal disease. Colonoscopy was done by Dr. Collene Mares. A lesion in the anorectal area was biopsied and consistent with squamous cell carcinoma in situ. She has felt a fullness around the anal area. She does have intermittent problems with constipation. She has been nervous ever since the diagnosis was discussed with her.  Other Problems [name], CMA; 10/06/2014 11:18 AM) Anxiety Disorder Arthritis Back Pain Depression Migraine Headache Oophorectomy Bilateral. Other disease, cancer, significant illness Rectal Cancer  Past Surgical History [name], CMA; 10/06/2014 11:18 AM) Colon Polyp Removal - Colonoscopy Hysterectomy (not due to cancer) - Complete Hysterectomy (not due to cancer) - Partial Oral Surgery Spinal Surgery - Neck  Diagnostic Studies History [name], CMA; 10/06/2014 11:18 AM) Colonoscopy within last year Mammogram 1-3 years ago Pap Smear 1-5 years ago  Allergies [name], CMA; 10/06/2014 11:26 AM) Cyclobenzaprine *CHEMICALS* Swelling. Methocarbamol *CHEMICALS* Swelling.  Medication History [name], CMA; 10/06/2014 11:26 AM) Oxycodone-Acetaminophen (7.5-325MG  Tablet, Oral) Active. Diazepam (5MG  Tablet, Oral as directed) Active. Estradiol (1MG  Tablet, Oral daily) Active. HydrOXYzine Pamoate (50MG  Capsule, Oral daily) Active. Fluocinonide (0.05% Cream, External as directed) Active. Linzess  (145MCG Capsule, Oral daily) Active. Mupirocin (2% Ointment, External as directed) Active. Nicoderm CQ (21MG /24HR Patch 24HR, Transdermal as directed) Active. Ondansetron HCl (4MG  Tablet, Oral prn) Active. FiberCon (625MG  Tablet, Oral as directed) Active. Lyrica (300MG  Capsule, Oral bid) Active. Promethazine HCl (12.5MG  Tablet, Oral prn) Active. ROPINIRole HCl (1MG  Tablet, Oral daily) Active. SUMAtriptan Succinate (100MG  Tablet, Oral daily) Active. Topiramate (50MG  Tablet, Oral daily) Active. TraZODone HCl (300MG  Tablet, Oral daily) Active. Venlafaxine HCl ER (150MG  Capsule ER 24HR, Oral daily) Active. Venlafaxine HCl ER (75MG  Capsule ER 24HR, Oral) Active. Medications Reconciled  Social History [name], CMA; 10/06/2014 11:18 AM) Alcohol use Remotely quit alcohol use. Caffeine use Carbonated beverages. No drug use Tobacco use Former smoker.  Family History [name], [place]; 10/06/2014 11:18 AM) Alcohol Abuse Daughter, Father, Sister. Arthritis Mother. Depression Mother. Diabetes Mellitus Father, Sister. Heart Disease Father. Respiratory Condition Father. Thyroid problems Mother, Sister.  Pregnancy / Birth History [name], [place]; 10/06/2014 11:18 AM) Age at menarche 66 years. Age of menopause 3-50 Gravida 3 Maternal age 64-25 Para 3     Review of Systems ([place]; 10/06/2014 11:18 AM) General Present- Appetite Loss and Fatigue. Not Present- Chills, Fever, Night Sweats, Weight Gain and Weight Loss. Skin Present- Dryness. Not Present- Change in Wart/Mole, Hives, Jaundice, New Lesions, Non-Healing Wounds, Rash and Ulcer. HEENT Present- Seasonal Allergies, Sinus Pain and Visual Disturbances. Not Present- Earache, Hearing Loss, Hoarseness, Nose Bleed, Oral Ulcers, Ringing in the Ears, Sore Throat, Wears glasses/contact lenses and Yellow Eyes. Respiratory Present- Chronic Cough, Snoring and Wheezing. Not Present- Bloody sputum  and Difficulty Breathing. Breast Not Present- Breast Mass, Breast Pain, Nipple Discharge and Skin Changes. Cardiovascular Present- Palpitations. Not Present- Chest Pain, Difficulty Breathing Lying Down, Leg Cramps, Rapid Heart Rate, Shortness of Breath and Swelling of Extremities. Gastrointestinal Present- Abdominal Pain, Bloating, Bloody Stool, Change  in Bowel Habits, Chronic diarrhea, Constipation, Excessive gas and Rectal Pain. Not Present- Difficulty Swallowing, Gets full quickly at meals, Hemorrhoids, Indigestion, Nausea and Vomiting. Female Genitourinary Present- Frequency and Nocturia. Not Present- Painful Urination, Pelvic Pain and Urgency. Musculoskeletal Present- Joint Pain, Joint Stiffness, Muscle Pain and Swelling of Extremities. Not Present- Back Pain and Muscle Weakness. Neurological Present- Headaches. Not Present- Decreased Memory, Fainting, Numbness, Seizures, Tingling, Tremor, Trouble walking and Weakness. Psychiatric Present- Anxiety, Depression and Frequent crying. Not Present- Bipolar, Change in Sleep Pattern and Fearful. Endocrine Present- Hot flashes. Not Present- Cold Intolerance, Excessive Hunger, Hair Changes, Heat Intolerance and New Diabetes. Hematology Present- Easy Bruising. Not Present- Excessive bleeding, Gland problems, HIV and Persistent Infections.  Vitals [name] CMA; 10/06/2014 11:26 AM) 10/06/2014 11:26 AM Weight: 115.2 lb Height: 63in Body Surface Area: 1.52 m Body Mass Index: 20.41 kg/m Temp.: 97.26F(Oral)  Pulse: 68 (Regular)  BP: 102/70 (Sitting, Left Arm, Standard)     Physical Exam Stephanie Hollingshead MD; 10/06/2014 12:44 PM)  The physical exam findings are as follows: Note:General: WDWN in NAD. Pleasant and cooperative.  HEENT: [place]/AT, no facial masses  CV: RRR, no murmur, no JVD.  CHEST: Breath sounds equal and clear. Respirations nonlabored.  ABDOMEN: Soft, nontender, nondistended, no masses.  ANORECTAL: No fissures.  Normal sphincter tone. Palpable mass at 7 o'clock position. Anoscopy also demonstrates an irregular appearing mass at this position.  GU: No palpable inguinal adenopathy.  NEUROLOGIC: Alert and oriented, answers questions appropriately, normal gait and station.    Assessment & Plan Stephanie Hollingshead MD; 10/06/2014 12:44 PM)  SQUAMOUS CELL CARCINOMA OF ANUS (154.3  C21.0) Impression: She has in situ squamous cell carcinoma of the anus.  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 The procedure and risks were discussed with her. Risks include but are not limited to bleeding, infection, incontinence, anesthesia, recurrence. She seems to understand this and agrees with the plan.  Jackolyn Confer, MD

## 2014-10-12 ENCOUNTER — Encounter: Payer: BLUE CROSS/BLUE SHIELD | Admitting: Podiatry

## 2014-10-15 ENCOUNTER — Telehealth: Payer: Self-pay | Admitting: Sports Medicine

## 2014-10-15 NOTE — Telephone Encounter (Signed)
Patient called to adv that she is having cancer surgery on 10/28/14 at [facility] w/ Dr. Orpah Melter. Thanks

## 2014-10-18 ENCOUNTER — Telehealth: Payer: Self-pay

## 2014-10-18 MED ORDER — ALPRAZOLAM 1 MG PO TABS: 1.0000 mg | ORAL_TABLET | Freq: Three times a day (TID) | ORAL | Status: DC

## 2014-10-18 NOTE — Telephone Encounter (Signed)
Rx written, she needs to decrease her total use of Xanax and we are going to use a non-habit forming controller medication instead. Next month we will do 60, then the next month 30.

## 2014-10-18 NOTE — Patient Instructions (Signed)
Stephanie Gregory  10/18/2014   Your procedure is scheduled on:   10/28/2014    Report to [facility] Main  Entrance take [place]  elevators to 3rd floor to  [place] at      [place] AM.  Call this number if you have problems the morning of surgery [phone]   Remember: ONLY 1 PERSON MAY GO WITH YOU TO SHORT STAY TO GET  READY MORNING OF [place].  Do not eat food or drink liquids :After Midnight.     Take these medicines the morning of surgery with A SIP OF WATER:    Xanax if needed, oxycodone if needed, Lyrica , Effexor                                You may not have any metal on your body including hair pins and              piercings  Do not wear jewelry, make-up, lotions, powders or perfumes, deodorant             Do not wear nail polish.  Do not shave  48 hours prior to surgery.                Do not bring valuables to the hospital. [place].  Contacts, dentures or bridgework may not be worn into surgery.      Patients discharged the day of surgery will not be allowed to drive home.  Name and phone number of your driver:  Special Instructions: coughing and deep breathing exercises, leg exercises               Please read over the following fact sheets you were given: _____________________________________________________________________             [facility] - Preparing for Surgery Before surgery, you can play an important role.  Because skin is not sterile, your skin needs to be as free of germs as possible.  You can reduce the number of germs on your skin by washing with CHG (chlorahexidine gluconate) soap before surgery.  CHG is an antiseptic cleaner which kills germs and bonds with the skin to continue killing germs even after washing. Please DO NOT use if you have an allergy to CHG or antibacterial soaps.  If your skin becomes reddened/irritated stop using the CHG and inform your nurse when  you arrive at Short Stay. Do not shave (including legs and underarms) for at least 48 hours prior to the first CHG shower.  You may shave your face/neck. Please follow these instructions carefully:  1.  Shower with CHG Soap the night before surgery and the  morning of Surgery.  2.  If you choose to wash your hair, wash your hair first as usual with your  normal  shampoo.  3.  After you shampoo, rinse your hair and body thoroughly to remove the  shampoo.                           4.  Use CHG as you would any other liquid soap.  You can apply chg directly  to the skin and wash  Gently with a scrungie or clean washcloth.  5.  Apply the CHG Soap to your body ONLY FROM THE NECK DOWN.   Do not use on face/ open                           Wound or open sores. Avoid contact with eyes, ears mouth and genitals (private parts).                       Wash face,  Genitals (private parts) with your normal soap.             6.  Wash thoroughly, paying special attention to the area where your surgery  will be performed.  7.  Thoroughly rinse your body with warm water from the neck down.  8.  DO NOT shower/wash with your normal soap after using and rinsing off  the CHG Soap.                9.  Pat yourself dry with a clean towel.            10.  Wear clean pajamas.            11.  Place clean sheets on your bed the night of your first shower and do not  sleep with pets. Day of Surgery : Do not apply any lotions/deodorants the morning of surgery.  Please wear clean clothes to the hospital/surgery center.  FAILURE TO FOLLOW THESE INSTRUCTIONS MAY RESULT IN THE CANCELLATION OF YOUR SURGERY PATIENT SIGNATURE_________________________________  NURSE SIGNATURE__________________________________  ________________________________________________________________________

## 2014-10-18 NOTE — Telephone Encounter (Signed)
Patient called stated that she was only given  30 tablets of Xanax but she takes it 3 times a day. patient is requesting a 30 day supply. Rhonda Cunningham,CMA

## 2014-10-19 ENCOUNTER — Inpatient Hospital Stay (HOSPITAL_COMMUNITY)
Admission: RE | Admit: 2014-10-19 | Discharge: 2014-10-19 | Disposition: A | Discharge status: Home / Self Care | Payer: Self-pay | Source: Ambulatory Visit

## 2014-10-19 NOTE — Telephone Encounter (Signed)
Rx faxed to [facility] [phone]. Barbara Keng,CMA

## 2014-10-21 ENCOUNTER — Telehealth: Payer: Self-pay

## 2014-10-21 ENCOUNTER — Encounter (HOSPITAL_COMMUNITY)
Admission: RE | Admit: 2014-10-21 | Discharge: 2014-10-21 | Disposition: A | Discharge status: Home / Self Care | Payer: BLUE CROSS/BLUE SHIELD | Source: Ambulatory Visit | Attending: General Surgery | Admitting: General Surgery

## 2014-10-21 ENCOUNTER — Encounter (HOSPITAL_COMMUNITY): Payer: Self-pay

## 2014-10-21 DIAGNOSIS — Z01812 Encounter for preprocedural laboratory examination: Secondary | ICD-10-CM | POA: Insufficient documentation

## 2014-10-21 DIAGNOSIS — D013 Carcinoma in situ of anus and anal canal: Secondary | ICD-10-CM | POA: Diagnosis not present

## 2014-10-21 HISTORY — DX: Tremor, unspecified: R25.1

## 2014-10-21 HISTORY — DX: Allergy status to unspecified drugs, medicaments and biological substances: Z88.9

## 2014-10-21 HISTORY — DX: Bunion of left foot: M21.612

## 2014-10-21 HISTORY — DX: Anesthesia of skin: R20.2

## 2014-10-21 HISTORY — DX: Malignant (primary) neoplasm, unspecified: C80.1

## 2014-10-21 HISTORY — DX: Anesthesia of skin: R20.0

## 2014-10-21 HISTORY — DX: Reserved for inherently not codable concepts without codable children: IMO0001

## 2014-10-21 LAB — COMPREHENSIVE METABOLIC PANEL
ALK PHOS: 60 U/L (ref 38–126)
ALT: 11 U/L — ABNORMAL LOW (ref 14–54)
ANION GAP: 6 (ref 5–15)
AST: 15 U/L (ref 15–41)
Albumin: 4 g/dL (ref 3.5–5.0)
BUN: 23 mg/dL — ABNORMAL HIGH (ref 6–20)
CALCIUM: 8.9 mg/dL (ref 8.9–10.3)
CHLORIDE: 105 mmol/L (ref 101–111)
CO2: 26 mmol/L (ref 22–32)
Creatinine, Ser: 0.85 mg/dL (ref 0.44–1.00)
Glucose, Bld: 94 mg/dL (ref 65–99)
POTASSIUM: 3.9 mmol/L (ref 3.5–5.1)
SODIUM: 137 mmol/L (ref 135–145)
TOTAL PROTEIN: 7.1 g/dL (ref 6.5–8.1)
Total Bilirubin: 0.5 mg/dL (ref 0.3–1.2)

## 2014-10-21 LAB — CBC WITH DIFFERENTIAL/PLATELET
BASOS PCT: 1 % (ref 0–1)
Basophils Absolute: 0.1 10*3/uL (ref 0.0–0.1)
Eosinophils Absolute: 0.3 10*3/uL (ref 0.0–0.7)
Eosinophils Relative: 6 % — ABNORMAL HIGH (ref 0–5)
HEMATOCRIT: 44 % (ref 36.0–46.0)
Hemoglobin: 13.9 g/dL (ref 12.0–15.0)
Lymphocytes Relative: 35 % (ref 12–46)
Lymphs Abs: 1.9 10*3/uL (ref 0.7–4.0)
MCH: 27.6 pg (ref 26.0–34.0)
MCHC: 31.6 g/dL (ref 30.0–36.0)
MCV: 87.5 fL (ref 78.0–100.0)
MONOS PCT: 14 % — AB (ref 3–12)
Monocytes Absolute: 0.7 10*3/uL (ref 0.1–1.0)
NEUTROS PCT: 44 % (ref 43–77)
Neutro Abs: 2.4 10*3/uL (ref 1.7–7.7)
PLATELETS: 214 10*3/uL (ref 150–400)
RBC: 5.03 MIL/uL (ref 3.87–5.11)
RDW: 14 % (ref 11.5–15.5)
WBC: 5.3 10*3/uL (ref 4.0–10.5)

## 2014-10-21 LAB — PROTIME-INR
INR: 0.97 (ref 0.00–1.49)
Prothrombin Time: 13.1 seconds (ref 11.6–15.2)

## 2014-10-21 LAB — SURGICAL PCR SCREEN
MRSA, PCR: POSITIVE — AB
STAPHYLOCOCCUS AUREUS: POSITIVE — AB

## 2014-10-21 NOTE — Patient Instructions (Signed)
EMMA BIRCHLER  10/21/2014   Your procedure is scheduled on: Thursday October 28, 2014   Report to [facility] Main  Entrance take [place]  elevators to 3rd floor to  [place] at 5:00 AM.  Call this number if you have problems the morning of surgery [phone]   Remember: ONLY 1 PERSON MAY GO WITH YOU TO SHORT STAY TO GET  READY MORNING OF [place].  Do not eat food or drink liquids :After Midnight.     Take these medicines the morning of surgery with A SIP OF WATER: Alprazolam (Xanax) if needed; oxycodone if needed; Lyrica; Effexor                                You may not have any metal on your body including hair pins and              piercings  Do not wear jewelry, make-up, lotions, powders or perfumes, deodorant             Do not wear nail polish.  Do not shave  48 hours prior to surgery.                Do not bring valuables to the hospital. [place].  Contacts, dentures or bridgework may not be worn into surgery.     Patients discharged the day of surgery will not be allowed to drive home.  Name and phone number of your driver:Diane Delsa Bern (mother)   Please read over the following fact sheets you were given:MRSA INFORMATION SHEET  _____________________________________________________________________             [facility] - Preparing for Surgery Before surgery, you can play an important role.  Because skin is not sterile, your skin needs to be as free of germs as possible.  You can reduce the number of germs on your skin by washing with CHG (chlorahexidine gluconate) soap before surgery.  CHG is an antiseptic cleaner which kills germs and bonds with the skin to continue killing germs even after washing. Please DO NOT use if you have an allergy to CHG or antibacterial soaps.  If your skin becomes reddened/irritated stop using the CHG and inform your nurse when you arrive at Short Stay. Do not  shave (including legs and underarms) for at least 48 hours prior to the first CHG shower.  You may shave your face/neck. Please follow these instructions carefully:  1.  Shower with CHG Soap the night before surgery and the  morning of Surgery.  2.  If you choose to wash your hair, wash your hair first as usual with your  normal  shampoo.  3.  After you shampoo, rinse your hair and body thoroughly to remove the  shampoo.                           4.  Use CHG as you would any other liquid soap.  You can apply chg directly  to the skin and wash                       Gently with a scrungie or clean washcloth.  5.  Apply the CHG Soap to your body ONLY FROM THE NECK DOWN.   Do not use on face/ open                           Wound or open sores. Avoid contact with eyes, ears mouth and genitals (private parts).                       Wash face,  Genitals (private parts) with your normal soap.             6.  Wash thoroughly, paying special attention to the area where your surgery  will be performed.  7.  Thoroughly rinse your body with warm water from the neck down.  8.  DO NOT shower/wash with your normal soap after using and rinsing off  the CHG Soap.                9.  Pat yourself dry with a clean towel.            10.  Wear clean pajamas.            11.  Place clean sheets on your bed the night of your first shower and do not  sleep with pets. Day of Surgery : Do not apply any lotions/deodorants the morning of surgery.  Please wear clean clothes to the hospital/surgery center.  FAILURE TO FOLLOW THESE INSTRUCTIONS MAY RESULT IN THE CANCELLATION OF YOUR SURGERY PATIENT SIGNATURE_________________________________  NURSE SIGNATURE__________________________________  ________________________________________________________________________

## 2014-10-21 NOTE — Progress Notes (Signed)
CMP results per epic per PAT visit 10/21/2014 sent to Dr Zella Richer

## 2014-10-21 NOTE — Telephone Encounter (Signed)
Please let patient know I do not do any medical pain management.  I'm happy to do a referral to pain management.

## 2014-10-21 NOTE — Telephone Encounter (Signed)
Patient request a prescription for pain medication for her neck pain.  PLEASE ADVISE PER Box CONTROLLED SUBSTANCE WEBSITE PATIENT WAS 84 OXYCODON -ACETAMINOPHEN 7.5-325 ON 09/23/2014 BY DR. Joylene Grapes, MD  A COPY HAS BEEN PLACED IN YOUR BASKET.Rhonda Cunningham,CMA

## 2014-10-21 NOTE — Telephone Encounter (Signed)
Spoke to patient gave her information as noted below. Stephanie Gregory,CMA  

## 2014-10-21 NOTE — Progress Notes (Signed)
CXR epic 01/25/2014 EKG epic 01/25/2014

## 2014-10-22 NOTE — Progress Notes (Signed)
Surgical screening results in epic per PAT visit 10/21/2014 positive for MRSA and STAPH. Results sent to Dr Zella Richer. Prescription for Mupriocin Ointment called to [facility] - spoke with Benjamine Mola / pharmacist. Pt notified.

## 2014-10-28 ENCOUNTER — Telehealth: Payer: Self-pay | Admitting: Surgery

## 2014-10-28 ENCOUNTER — Ambulatory Visit (HOSPITAL_COMMUNITY)
Admission: RE | Admit: 2014-10-28 | Discharge: 2014-10-28 | Disposition: A | Discharge status: Home / Self Care | Payer: BLUE CROSS/BLUE SHIELD | Source: Ambulatory Visit | Attending: General Surgery | Admitting: General Surgery

## 2014-10-28 ENCOUNTER — Ambulatory Visit (HOSPITAL_COMMUNITY): Payer: BLUE CROSS/BLUE SHIELD | Admitting: Certified Registered Nurse Anesthetist

## 2014-10-28 ENCOUNTER — Encounter (HOSPITAL_COMMUNITY): Payer: Self-pay | Admitting: *Deleted

## 2014-10-28 ENCOUNTER — Encounter (HOSPITAL_COMMUNITY)
Admission: RE | Disposition: A | Discharge status: Home / Self Care | Payer: Self-pay | Source: Ambulatory Visit | Attending: General Surgery

## 2014-10-28 DIAGNOSIS — K629 Disease of anus and rectum, unspecified: Secondary | ICD-10-CM | POA: Diagnosis not present

## 2014-10-28 DIAGNOSIS — K5900 Constipation, unspecified: Secondary | ICD-10-CM | POA: Diagnosis not present

## 2014-10-28 DIAGNOSIS — G43909 Migraine, unspecified, not intractable, without status migrainosus: Secondary | ICD-10-CM | POA: Insufficient documentation

## 2014-10-28 DIAGNOSIS — M1990 Unspecified osteoarthritis, unspecified site: Secondary | ICD-10-CM | POA: Insufficient documentation

## 2014-10-28 DIAGNOSIS — D013 Carcinoma in situ of anus and anal canal: Secondary | ICD-10-CM | POA: Diagnosis not present

## 2014-10-28 DIAGNOSIS — K219 Gastro-esophageal reflux disease without esophagitis: Secondary | ICD-10-CM | POA: Insufficient documentation

## 2014-10-28 DIAGNOSIS — Z87891 Personal history of nicotine dependence: Secondary | ICD-10-CM | POA: Insufficient documentation

## 2014-10-28 DIAGNOSIS — Z79899 Other long term (current) drug therapy: Secondary | ICD-10-CM | POA: Insufficient documentation

## 2014-10-28 DIAGNOSIS — F329 Major depressive disorder, single episode, unspecified: Secondary | ICD-10-CM | POA: Insufficient documentation

## 2014-10-28 DIAGNOSIS — F419 Anxiety disorder, unspecified: Secondary | ICD-10-CM | POA: Diagnosis not present

## 2014-10-28 HISTORY — PX: EXAMINATION UNDER ANESTHESIA: SHX1540

## 2014-10-28 SURGERY — EXAM UNDER ANESTHESIA
Anesthesia: General | Site: Anus

## 2014-10-28 MED ORDER — BUPIVACAINE-EPINEPHRINE 0.5% -1:200000 IJ SOLN: INTRAMUSCULAR | Status: DC | PRN

## 2014-10-28 MED ORDER — FENTANYL CITRATE (PF) 100 MCG/2ML IJ SOLN
INTRAMUSCULAR | Status: AC
  Filled 2014-10-28: qty 2

## 2014-10-28 MED ORDER — 0.9 % SODIUM CHLORIDE (POUR BTL) OPTIME
TOPICAL | Status: DC | PRN
  Administered 2014-10-28: 1000 mL

## 2014-10-28 MED ORDER — PROPOFOL 10 MG/ML IV BOLUS
INTRAVENOUS | Status: DC | PRN
  Administered 2014-10-28: 150 mg via INTRAVENOUS

## 2014-10-28 MED ORDER — HYDROMORPHONE HCL 1 MG/ML IJ SOLN: 0.2500 mg | INTRAMUSCULAR | Status: DC | PRN

## 2014-10-28 MED ORDER — ACETAMINOPHEN 160 MG/5ML PO SOLN
325.0000 mg | ORAL | Status: DC | PRN
  Filled 2014-10-28: qty 20.3

## 2014-10-28 MED ORDER — MIDAZOLAM HCL 2 MG/2ML IJ SOLN
INTRAMUSCULAR | Status: AC
  Filled 2014-10-28: qty 2

## 2014-10-28 MED ORDER — PROPOFOL 10 MG/ML IV BOLUS
INTRAVENOUS | Status: AC
  Filled 2014-10-28: qty 20

## 2014-10-28 MED ORDER — MORPHINE SULFATE 10 MG/ML IJ SOLN: 2.0000 mg | INTRAMUSCULAR | Status: DC | PRN

## 2014-10-28 MED ORDER — CEFOTETAN DISODIUM-DEXTROSE 2-2.08 GM-% IV SOLR
INTRAVENOUS | Status: AC
  Filled 2014-10-28: qty 50

## 2014-10-28 MED ORDER — ACETAMINOPHEN 650 MG RE SUPP
650.0000 mg | RECTAL | Status: DC | PRN
  Filled 2014-10-28: qty 1

## 2014-10-28 MED ORDER — DEXAMETHASONE SODIUM PHOSPHATE 10 MG/ML IJ SOLN
INTRAMUSCULAR | Status: AC
  Filled 2014-10-28: qty 1

## 2014-10-28 MED ORDER — ONDANSETRON HCL 4 MG/2ML IJ SOLN
INTRAMUSCULAR | Status: AC
  Filled 2014-10-28: qty 2

## 2014-10-28 MED ORDER — LIDOCAINE HCL (CARDIAC) 20 MG/ML IV SOLN
INTRAVENOUS | Status: DC | PRN
  Administered 2014-10-28: 100 mg via INTRAVENOUS

## 2014-10-28 MED ORDER — SODIUM CHLORIDE 0.9 % IJ SOLN: 3.0000 mL | INTRAMUSCULAR | Status: DC | PRN

## 2014-10-28 MED ORDER — PROMETHAZINE HCL 25 MG/ML IJ SOLN: 6.2500 mg | INTRAMUSCULAR | Status: DC | PRN

## 2014-10-28 MED ORDER — HYDROCODONE-ACETAMINOPHEN 5-325 MG PO TABS: 1.0000 | ORAL_TABLET | Freq: Four times a day (QID) | ORAL | Status: DC | PRN

## 2014-10-28 MED ORDER — ACETAMINOPHEN 325 MG PO TABS: 650.0000 mg | ORAL_TABLET | ORAL | Status: DC | PRN

## 2014-10-28 MED ORDER — BUPIVACAINE HCL (PF) 0.5 % IJ SOLN
INTRAMUSCULAR | Status: DC | PRN
  Administered 2014-10-28: 4 mL

## 2014-10-28 MED ORDER — FENTANYL CITRATE (PF) 100 MCG/2ML IJ SOLN
INTRAMUSCULAR | Status: DC | PRN
  Administered 2014-10-28: 50 ug via INTRAVENOUS

## 2014-10-28 MED ORDER — OXYCODONE HCL 5 MG PO TABS
5.0000 mg | ORAL_TABLET | ORAL | Status: DC | PRN
  Administered 2014-10-28: 5 mg via ORAL
  Filled 2014-10-28: qty 1

## 2014-10-28 MED ORDER — LACTATED RINGERS IV SOLN
INTRAVENOUS | Status: DC | PRN
  Administered 2014-10-28: 07:00:00 via INTRAVENOUS

## 2014-10-28 MED ORDER — BUPIVACAINE LIPOSOME 1.3 % IJ SUSP
20.0000 mL | Freq: Once | INTRAMUSCULAR | Status: AC
  Administered 2014-10-28: 20 mL
  Filled 2014-10-28: qty 20

## 2014-10-28 MED ORDER — DEXAMETHASONE SODIUM PHOSPHATE 10 MG/ML IJ SOLN
INTRAMUSCULAR | Status: DC | PRN
  Administered 2014-10-28: 10 mg via INTRAVENOUS

## 2014-10-28 MED ORDER — OXYCODONE HCL 5 MG PO TABS: 5.0000 mg | ORAL_TABLET | Freq: Once | ORAL | Status: DC | PRN

## 2014-10-28 MED ORDER — ONDANSETRON HCL 4 MG/2ML IJ SOLN
INTRAMUSCULAR | Status: DC | PRN
  Administered 2014-10-28: 4 mg via INTRAVENOUS

## 2014-10-28 MED ORDER — ACETAMINOPHEN 325 MG PO TABS: 325.0000 mg | ORAL_TABLET | ORAL | Status: DC | PRN

## 2014-10-28 MED ORDER — CEFOTETAN DISODIUM-DEXTROSE 2-2.08 GM-% IV SOLR
2.0000 g | Freq: Once | INTRAVENOUS | Status: AC
  Administered 2014-10-28: 2 g via INTRAVENOUS

## 2014-10-28 MED ORDER — BUPIVACAINE HCL (PF) 0.5 % IJ SOLN
INTRAMUSCULAR | Status: AC
  Filled 2014-10-28: qty 30

## 2014-10-28 MED ORDER — OXYCODONE HCL 5 MG/5ML PO SOLN: 5.0000 mg | Freq: Once | ORAL | Status: DC | PRN

## 2014-10-28 MED ORDER — LIDOCAINE HCL (CARDIAC) 20 MG/ML IV SOLN
INTRAVENOUS | Status: AC
  Filled 2014-10-28: qty 5

## 2014-10-28 MED ORDER — MIDAZOLAM HCL 5 MG/5ML IJ SOLN
INTRAMUSCULAR | Status: DC | PRN
  Administered 2014-10-28 (×2): 1 mg via INTRAVENOUS

## 2014-10-28 SURGICAL SUPPLY — 35 items
BLADE HEX COATED 2.75 (ELECTRODE) ×2 IMPLANT
BLADE SURG 15 STRL LF DISP TIS (BLADE) ×1 IMPLANT
BLADE SURG 15 STRL SS (BLADE) ×2
COVER SURGICAL LIGHT HANDLE (MISCELLANEOUS) ×2 IMPLANT
DECANTER SPIKE VIAL GLASS SM (MISCELLANEOUS) ×1 IMPLANT
DRAIN PENROSE 18X1/2 LTX STRL (DRAIN) ×1 IMPLANT
DRAPE TABLE BACK 44X90 PK DISP (DRAPES) ×2 IMPLANT
DRSG PAD ABDOMINAL 8X10 ST (GAUZE/BANDAGES/DRESSINGS) ×2 IMPLANT
ELECT REM PT RETURN 9FT ADLT (ELECTROSURGICAL) ×2
ELECTRODE REM PT RTRN 9FT ADLT (ELECTROSURGICAL) ×1 IMPLANT
GAUZE SPONGE 4X4 12PLY STRL (GAUZE/BANDAGES/DRESSINGS) ×2 IMPLANT
GAUZE SPONGE 4X4 16PLY XRAY LF (GAUZE/BANDAGES/DRESSINGS) ×2 IMPLANT
GLOVE BIOGEL PI IND STRL 7.0 (GLOVE) ×1 IMPLANT
GLOVE BIOGEL PI INDICATOR 7.0 (GLOVE) ×1
GLOVE ECLIPSE 8.0 STRL XLNG CF (GLOVE) ×2 IMPLANT
GLOVE INDICATOR 8.0 STRL GRN (GLOVE) ×4 IMPLANT
GOWN STRL REUS W/TWL LRG LVL3 (GOWN DISPOSABLE) ×2 IMPLANT
GOWN STRL REUS W/TWL XL LVL3 (GOWN DISPOSABLE) ×4 IMPLANT
KIT BASIN OR (CUSTOM PROCEDURE TRAY) ×2 IMPLANT
LUBRICANT JELLY K Y 4OZ (MISCELLANEOUS) ×2 IMPLANT
NDL HYPO 25X1 1.5 SAFETY (NEEDLE) ×1 IMPLANT
NDL SAFETY ECLIPSE 18X1.5 (NEEDLE) ×1 IMPLANT
NEEDLE HYPO 18GX1.5 SHARP (NEEDLE)
NEEDLE HYPO 25X1 1.5 SAFETY (NEEDLE) ×2 IMPLANT
NS IRRIG 1000ML POUR BTL (IV SOLUTION) ×2 IMPLANT
PACK LITHOTOMY IV (CUSTOM PROCEDURE TRAY) ×2 IMPLANT
PENCIL BUTTON HOLSTER BLD 10FT (ELECTRODE) ×2 IMPLANT
SHEARS HARMONIC 9CM CVD (BLADE) ×1 IMPLANT
SPONGE SURGIFOAM ABS GEL 12-7 (HEMOSTASIS) ×2 IMPLANT
SUT CHROMIC 2 0 SH (SUTURE) IMPLANT
SUT CHROMIC 3 0 SH 27 (SUTURE) IMPLANT
SYR CONTROL 10ML LL (SYRINGE) ×2 IMPLANT
TOWEL OR 17X26 10 PK STRL BLUE (TOWEL DISPOSABLE) ×2 IMPLANT
UNDERPAD 30X30 INCONTINENT (UNDERPADS AND DIAPERS) ×2 IMPLANT
YANKAUER SUCT BULB TIP 10FT TU (MISCELLANEOUS) ×3 IMPLANT

## 2014-10-28 NOTE — Telephone Encounter (Signed)
Stephanie Gregory had a wide excision of a SCCA of the anus today by Dr. Zella Richer.  She has noticed that when she wipes or has a BM, she wipes bright red blood.  She has not had clots and, by our phone conversation, it does not sound like she is bleeding a lot.   I offered her to come to the ER, but I think she could check with our office in the AM.  She will check with our office in the AM.  [name], MD, [facility] Surgery Pager: [phone] Office phone:  [phone]

## 2014-10-28 NOTE — H&P (View-Only) (Signed)
Stephanie Gregory 10/06/2014 11:17 AM Location: [facility] Patient #: [id number] DOB: 05-04-1965 Single / Language: Stephanie Gregory / Race: White Female History of Present Illness Odis Hollingshead MD; 10/06/2014 12:43 PM) Patient words: squamous cell carcinoma rectum.  The patient is a 49 year old female    Note:She is referred by Dr. 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 She's been having rectal bleeding off and on for approximately 2 years which is felt to be secondary to hemorrhoidal disease. Colonoscopy was done by Dr. Collene Mares. A lesion in the anorectal area was biopsied and consistent with squamous cell carcinoma in situ. She has felt a fullness around the anal area. She does have intermittent problems with constipation. She has been nervous ever since the diagnosis was discussed with her.  Other Problems Stephanie Gregory, CMA; 10/06/2014 11:18 AM) Anxiety Disorder Arthritis Back Pain Depression Migraine Headache Oophorectomy Bilateral. Other disease, cancer, significant illness Rectal Cancer  Past Surgical History Stephanie Gregory, CMA; 10/06/2014 11:18 AM) Colon Polyp Removal - Colonoscopy Hysterectomy (not due to cancer) - Complete Hysterectomy (not due to cancer) - Partial Oral Surgery Spinal Surgery - Neck  Diagnostic Studies History Stephanie Gregory, CMA; 10/06/2014 11:18 AM) Colonoscopy within last year Mammogram 1-3 years ago Pap Smear 1-5 years ago  Allergies Stephanie Gregory, CMA; 10/06/2014 11:26 AM) Cyclobenzaprine *CHEMICALS* Swelling. Methocarbamol *CHEMICALS* Swelling.  Medication History Stephanie Gregory, CMA; 10/06/2014 11:26 AM) Oxycodone-Acetaminophen (7.5-325MG  Tablet, Oral) Active. Diazepam (5MG  Tablet, Oral as directed) Active. Estradiol (1MG  Tablet, Oral daily) Active. HydrOXYzine Pamoate (50MG  Capsule, Oral daily) Active. Fluocinonide (0.05% Cream, External as directed) Active. Linzess  (145MCG Capsule, Oral daily) Active. Mupirocin (2% Ointment, External as directed) Active. Nicoderm CQ (21MG /24HR Patch 24HR, Transdermal as directed) Active. Ondansetron HCl (4MG  Tablet, Oral prn) Active. FiberCon (625MG  Tablet, Oral as directed) Active. Lyrica (300MG  Capsule, Oral bid) Active. Promethazine HCl (12.5MG  Tablet, Oral prn) Active. ROPINIRole HCl (1MG  Tablet, Oral daily) Active. SUMAtriptan Succinate (100MG  Tablet, Oral daily) Active. Topiramate (50MG  Tablet, Oral daily) Active. TraZODone HCl (300MG  Tablet, Oral daily) Active. Venlafaxine HCl ER (150MG  Capsule ER 24HR, Oral daily) Active. Venlafaxine HCl ER (75MG  Capsule ER 24HR, Oral) Active. Medications Reconciled  Social History Stephanie Gregory, CMA; 10/06/2014 11:18 AM) Alcohol use Remotely quit alcohol use. Caffeine use Carbonated beverages. No drug use Tobacco use Former smoker.  Family History Stephanie Gregory, [place]; 10/06/2014 11:18 AM) Alcohol Abuse Daughter, Father, Sister. Arthritis Mother. Depression Mother. Diabetes Mellitus Father, Sister. Heart Disease Father. Respiratory Condition Father. Thyroid problems Mother, Sister.  Pregnancy / Birth History Stephanie Gregory, [place]; 10/06/2014 11:18 AM) Age at menarche 47 years. Age of menopause 46-50 Gravida 3 Maternal age 72-25 Para 3     Review of Systems ([place]; 10/06/2014 11:18 AM) General Present- Appetite Loss and Fatigue. Not Present- Chills, Fever, Night Sweats, Weight Gain and Weight Loss. Skin Present- Dryness. Not Present- Change in Wart/Mole, Hives, Jaundice, New Lesions, Non-Healing Wounds, Rash and Ulcer. HEENT Present- Seasonal Allergies, Sinus Pain and Visual Disturbances. Not Present- Earache, Hearing Loss, Hoarseness, Nose Bleed, Oral Ulcers, Ringing in the Ears, Sore Throat, Wears glasses/contact lenses and Yellow Eyes. Respiratory Present- Chronic Cough, Snoring and Wheezing. Not Present- Bloody sputum  and Difficulty Breathing. Breast Not Present- Breast Mass, Breast Pain, Nipple Discharge and Skin Changes. Cardiovascular Present- Palpitations. Not Present- Chest Pain, Difficulty Breathing Lying Down, Leg Cramps, Rapid Heart Rate, Shortness of Breath and Swelling of Extremities. Gastrointestinal Present- Abdominal Pain, Bloating, Bloody Stool, Change  in Bowel Habits, Chronic diarrhea, Constipation, Excessive gas and Rectal Pain. Not Present- Difficulty Swallowing, Gets full quickly at meals, Hemorrhoids, Indigestion, Nausea and Vomiting. Female Genitourinary Present- Frequency and Nocturia. Not Present- Painful Urination, Pelvic Pain and Urgency. Musculoskeletal Present- Joint Pain, Joint Stiffness, Muscle Pain and Swelling of Extremities. Not Present- Back Pain and Muscle Weakness. Neurological Present- Headaches. Not Present- Decreased Memory, Fainting, Numbness, Seizures, Tingling, Tremor, Trouble walking and Weakness. Psychiatric Present- Anxiety, Depression and Frequent crying. Not Present- Bipolar, Change in Sleep Pattern and Fearful. Endocrine Present- Hot flashes. Not Present- Cold Intolerance, Excessive Hunger, Hair Changes, Heat Intolerance and New Diabetes. Hematology Present- Easy Bruising. Not Present- Excessive bleeding, Gland problems, HIV and Persistent Infections.  Vitals [name] CMA; 10/06/2014 11:26 AM) 10/06/2014 11:26 AM Weight: 115.2 lb Height: 63in Body Surface Area: 1.52 m Body Mass Index: 20.41 kg/m Temp.: 97.9F(Oral)  Pulse: 68 (Regular)  BP: 102/70 (Sitting, Left Arm, Standard)     Physical Exam Odis Hollingshead MD; 10/06/2014 12:44 PM)  The physical exam findings are as follows: Note:General: WDWN in NAD. Pleasant and cooperative.  HEENT: [place]/AT, no facial masses  CV: RRR, no murmur, no JVD.  CHEST: Breath sounds equal and clear. Respirations nonlabored.  ABDOMEN: Soft, nontender, nondistended, no masses.  ANORECTAL: No fissures.  Normal sphincter tone. Palpable mass at 7 o'clock position. Anoscopy also demonstrates an irregular appearing mass at this position.  GU: No palpable inguinal adenopathy.  NEUROLOGIC: Alert and oriented, answers questions appropriately, normal gait and station.    Assessment & Plan Odis Hollingshead MD; 10/06/2014 12:44 PM)  SQUAMOUS CELL CARCINOMA OF ANUS (154.3  C21.0) Impression: She has in situ squamous cell carcinoma of the anus.  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 The procedure and risks were discussed with her. Risks include but are not limited to bleeding, infection, incontinence, anesthesia, recurrence. She seems to understand this and agrees with the plan.  Jackolyn Confer, MD

## 2014-10-28 NOTE — Anesthesia Postprocedure Evaluation (Signed)
  Anesthesia Post-op Note  Patient: Stephanie Gregory  Procedure(s) Performed: Procedure(s): EXAM UNDER ANESTHESIA, REMOVAL OF SQUAMOUS CELL CARCINOMA IN SITU OF THE ANUS (N/A)  Patient Location: PACU  Anesthesia Type:General  Level of Consciousness: awake  Airway and Oxygen Therapy: Patient Spontanous Breathing  Post-op Pain: none  Post-op Assessment: Post-op Vital signs reviewed, Patient's Cardiovascular Status Stable, Respiratory Function Stable, Patent Airway, No signs of Nausea or vomiting and Pain level controlled              Post-op Vital Signs: Reviewed and stable  Last Vitals:  Filed Vitals:   10/28/14 0916  BP: 114/70  Pulse: 59  Temp: 36.3 C  Resp:     Complications: No apparent anesthesia complications
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
## 2014-10-28 NOTE — Discharge Instructions (Addendum)
[facility] _______[facility]  RECTAL SURGERY POST OP INSTRUCTIONS: POST OP INSTRUCTIONS  Always review your discharge instruction sheet given to you by the facility where your surgery was performed. IF YOU HAVE DISABILITY OR FAMILY LEAVE FORMS, YOU MUST BRING THEM TO THE OFFICE FOR PROCESSING.   DO NOT GIVE THEM TO YOUR DOCTOR.  1. A  prescription for pain medication may be given to you upon discharge.  Take your pain medication as prescribed, if needed.  If narcotic pain medicine is not needed, then you may take acetaminophen (Tylenol) or ibuprofen (Advil) as needed. 2. Take your usually prescribed medications unless otherwise directed. 3. You should follow a light diet the first 48 hours after arrival home, such as soup and crackers, etc.  Be sure to include lots of fluids daily.  Resume your normal diet 2-3 days after surgery.. 4. Most patients will experience some swelling and discomfort in the rectal area. Ice packs, reclining and warm tub soaks will help.  Swelling and discomfort can take several days to resolve.  5. It is common to experience some constipation if taking pain medication after surgery.  Increasing fluid intake and taking a stool softener (such as Colace) will usually help or prevent this problem from occurring.  A mild laxative (Milk of Magnesia or Miralax) should be taken according to package directions if there are no bowel movements after 48 hours. 6. Unless discharge instructions indicate otherwise, leave your bandage dry and in place for 24 hours, or remove the bandage if you have a bowel movement. You may notice a small amount of bleeding with bowel movements for the first few days. You may have some packing in the rectum which will come out over the first day or two. You will need to wear an absorbent pad or soft cotton gauze in your underwear until the drainage stops.it. 7. ACTIVITIES:  You may resume regular (light) daily activities beginning the next day--such  as daily self-care, walking, climbing stairs--gradually increasing activities as tolerated.  You may have sexual intercourse when it is comfortable.  Refrain from any heavy lifting or straining for 1 week. a. You may drive when you are no longer taking prescription pain medication, you can comfortably wear a seatbelt, and you can safely maneuver your car and apply brakes. b. RETURN TO WORK: : __1-2 weeks when comfortable 8. You should see your doctor in the office for a follow-up appointment approximately 2-3 weeks after your surgery.  Make sure that you call for this appointment within a day or two after you arrive home to insure a convenient appointment time. 9. OTHER INSTRUCTIONS:  Take stool softener 2 times per day. Milk of magnesia 2 tablespoons daily. WHEN TO CALL YOUR DOCTOR: 1. Fever over 101.0 2. Inability to urinate 3. Nausea and/or vomiting 4. Extreme swelling or bruising 5. Continued bleeding from rectum. 6. Increased pain, redness, or drainage from the incision 7. Constipation  The clinic staff is available to answer your questions during regular business hours.  Please dont hesitate to call and ask to speak to one of the nurses for clinical concerns.  If you have a medical emergency, go to the nearest emergency room or call 911.  A surgeon from [facility] is always on call at the hospital   [address] [place], [place], [place]  [address] ?  [address], [place], [place]   [address] [phone] ? [phone] ? FAX [phone] Web site: [email or web address]  General Anesthesia,  Care After Refer to this sheet in the next few weeks. These instructions provide you with information on caring for yourself after your procedure. Your health care provider may also give you more specific instructions. Your treatment has been planned according to current medical practices, but problems sometimes occur. Call your health care provider if you have any  problems or questions after your procedure. WHAT TO EXPECT AFTER THE PROCEDURE After the procedure, it is typical to experience:  Sleepiness.  Nausea and vomiting. HOME CARE INSTRUCTIONS  For the first 24 hours after general anesthesia:  Have a responsible person with you.  Do not drive a car. If you are alone, do not take public transportation.  Do not drink alcohol.  Do not take medicine that has not been prescribed by your health care provider.  Do not sign important papers or make important decisions.  You may resume a normal diet and activities as directed by your health care provider.  Change bandages (dressings) as directed.  If you have questions or problems that seem related to general anesthesia, call the hospital and ask for the anesthetist or anesthesiologist on call. SEEK MEDICAL CARE IF:  You have nausea and vomiting that continue the day after anesthesia.  You develop a rash. SEEK IMMEDIATE MEDICAL CARE IF:   You have difficulty breathing.  You have chest pain.  You have any allergic problems. Document Released: 07/02/2000 Document Revised: 03/31/2013 Document Reviewed: 10/09/2012 [facility] Patient Information 2015 [place], [place]. This information is not intended to replace advice given to you by your health care provider. Make sure you discuss any questions you have with your health care provider.

## 2014-10-28 NOTE — Anesthesia Preprocedure Evaluation (Signed)
Anesthesia Evaluation  Patient identified by MRN, date of birth, ID band Patient awake    Reviewed: Allergy & Precautions, NPO status , Patient's Chart, lab work & pertinent test results  History of Anesthesia Complications Negative for: history of anesthetic complications  Airway Mallampati: II  TM Distance: >3 FB Neck ROM: Full    Dental  (+) Missing, Partial Upper,    Pulmonary former smoker,  breath sounds clear to auscultation        Cardiovascular negative cardio ROS  Rhythm:Regular     Neuro/Psych  Headaches, PSYCHIATRIC DISORDERS Anxiety Depression    GI/Hepatic Neg liver ROS, GERD-  ,  Endo/Other  negative endocrine ROS  Renal/GU negative Renal ROS     Musculoskeletal  (+) Arthritis -,   Abdominal   Peds  Hematology negative hematology ROS (+)   Anesthesia Other Findings   Reproductive/Obstetrics                             Anesthesia Physical Anesthesia Plan  ASA: III  Anesthesia Plan: General   Post-op Pain Management:    Induction: Intravenous  Airway Management Planned: Oral ETT and LMA  Additional Equipment: None  Intra-op Plan:   Post-operative Plan: Extubation in OR  Informed Consent: I have reviewed the patients History and Physical, chart, labs and discussed the procedure including the risks, benefits and alternatives for the proposed anesthesia with the patient or authorized representative who has indicated his/her understanding and acceptance.   Dental advisory given  Plan Discussed with: CRNA and Surgeon  Anesthesia Plan Comments:         Anesthesia Quick Evaluation

## 2014-10-28 NOTE — Op Note (Signed)
Operative Note  Stephanie Gregory female 49 y.o. 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  Biopsy of this lesion showed SCC in situ.  She now presents for wider excision.    Procedure Detail:  She was brought to the operating room placed supine on the operating table in the general anesthetic was given. She was placed in the lithotomy position. The perianal area was sterilely prepped and draped.  Digital rectal exam demonstrated a palpable lesion in the anal canal at the 7:00 position. Anoscopy was performed and a nodular lesion was seen to 7:00 position. A white scar was seen lateral to this. Local anesthetic was infiltrated into the subcutaneous tissues from the 6:00 to the 9:00 position. Using electrocautery I marked the side site of excision beginning on the anoderm skin and extending up to the rectum with at least a 1 cm margin around both areas. I then dissected the muscular muscle away from the subcutaneous tissue and excised the area using the Harmonic scalpel. This was an excision from the 6:00 to 9:00 position. The specimen was sent off the field.  The wound was inspected and bleeding was controlled with electrocautery. I performed an anal block using Exparel.  Inspection of the rest of the anal canal demonstrated no lesions. A piece of Gelfoam was placed in the anus. A bulky dressing was applied.  She tolerated the procedure well without any apparent complications and was taken to the recovery room in satisfactory condition.          Specimens: 7:00  anal lesion        Complications:  * No complications entered in OR log *         Disposition: PACU - hemodynamically stable.         Condition: stable

## 2014-10-28 NOTE — Interval H&P Note (Signed)
History and Physical Interval Note:  10/28/2014 7:16 AM  Stephanie Gregory  has presented today for surgery, with the diagnosis of SQUAMOUS CELL CARCINOMA IN SITU OF THE ANUS  The various methods of treatment have been discussed with the patient and family. After consideration of risks, benefits and other options for treatment, the patient has consented to  Procedure(s): EXAM UNDER ANESTHESIA, REMOVAL OF SQUAMOUS CELL CARCINOMA IN SITU OF THE ANUS (N/A) as a surgical intervention .  The patient's history has been reviewed, patient examined, no change in status, stable for surgery.  I have reviewed the patient's chart and labs.  Questions were answered to the patient's satisfaction.     Josemiguel Gries Lenna Sciara
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
## 2014-10-28 NOTE — Transfer of Care (Signed)
Immediate Anesthesia Transfer of Care Note  Patient: Stephanie Gregory  Procedure(s) Performed: Procedure(s): EXAM UNDER ANESTHESIA, REMOVAL OF SQUAMOUS CELL CARCINOMA IN SITU OF THE ANUS (N/A)  Patient Location: PACU  Anesthesia Type:General  Level of Consciousness: sedated  Airway & Oxygen Therapy: Patient Spontanous Breathing and Patient connected to face mask oxygen  Post-op Assessment: Report given to RN and Post -op Vital signs reviewed and stable  Post vital signs: Reviewed and stable  Last Vitals:  Filed Vitals:   10/28/14 0510  BP: 108/72  Pulse: 66  Temp: 36.4 C  Resp: 16    Complications: No apparent anesthesia complications

## 2014-10-29 ENCOUNTER — Encounter (HOSPITAL_COMMUNITY): Payer: Self-pay | Admitting: General Surgery

## 2014-10-31 ENCOUNTER — Other Ambulatory Visit: Payer: Self-pay | Admitting: General Surgery

## 2014-10-31 MED ORDER — OXYCODONE HCL 5 MG PO TABS: 5.0000 mg | ORAL_TABLET | ORAL | Status: DC | PRN

## 2014-10-31 NOTE — Progress Notes (Signed)
Diarrhea is better but pain is not well-controlled.  Will leave a prescription for Oxy IR at [facility] front desk.

## 2014-11-01 ENCOUNTER — Telehealth: Payer: Self-pay | Admitting: *Deleted

## 2014-11-01 NOTE — Telephone Encounter (Signed)
"  I'm calling to reschedule my surgery.  I had to cancel it before because I had cancer.  I had surgery and they called and informed me they got it all."  So you don't have to have radiation or chemotherapy?  "No ma'am, they said I don't have to have it.  They got it all."  Congratulations, I know that's a big relief.  "Yes it is, thank you."  When would you like to schedule.  "I'd like to do it at the end of August."  He can do it 12/08/2014.  "Okay, that will be fine.  Is there anything else I need to do?"  Surgical center will call you a day or two prior to surgery date and give you arrival time.  Go online and complete information for surgical center, instructions are in your surgical packet.  "Is it [email or web address]?"  Yes, that is correct.  Go to Pre-registration.  "Okay, thank you."

## 2014-11-04 ENCOUNTER — Telehealth: Payer: Self-pay | Admitting: *Deleted

## 2014-11-04 NOTE — Telephone Encounter (Signed)
"  I'm having surgery on 08/31.  I was calling because I have the tendency to have MRSA.  It happens every time I'm getting ready to have surgery.  I have the medicine at home because I just had Cancer surgery.  I still have the medicine, I have a tube of it.  It's still good because I just now got it.  Could you call me and let me know if I need to do the medicine 5 days prior to the surgery?

## 2014-11-05 NOTE — Telephone Encounter (Signed)
I'm returning your call.  Dr. Jacqualyn Posey asked if it's medicine that you swab in your nose.  "Yes, I do put it in my nose."  He said it's okay for you to use it 5 days prior to your procedure.  "Do I do any pre-op work before surgery?"  No, you do not have any pre-operative tests or labs prior to surgery.  They may do something the day of surgery but I'm not sure what.  "Okay, thank you."

## 2014-11-22 ENCOUNTER — Encounter: Payer: Self-pay | Admitting: Sports Medicine

## 2014-11-22 ENCOUNTER — Ambulatory Visit (INDEPENDENT_AMBULATORY_CARE_PROVIDER_SITE_OTHER): Payer: BLUE CROSS/BLUE SHIELD | Admitting: Sports Medicine

## 2014-11-22 VITALS — BP 131/74 | HR 69 | Ht 63.0 in | Wt 122.0 lb

## 2014-11-22 DIAGNOSIS — C210 Malignant neoplasm of anus, unspecified: Secondary | ICD-10-CM

## 2014-11-22 DIAGNOSIS — G43009 Migraine without aura, not intractable, without status migrainosus: Secondary | ICD-10-CM

## 2014-11-22 DIAGNOSIS — J329 Chronic sinusitis, unspecified: Secondary | ICD-10-CM | POA: Insufficient documentation

## 2014-11-22 DIAGNOSIS — J321 Chronic frontal sinusitis: Secondary | ICD-10-CM | POA: Diagnosis not present

## 2014-11-22 DIAGNOSIS — F331 Major depressive disorder, recurrent, moderate: Secondary | ICD-10-CM | POA: Diagnosis not present

## 2014-11-22 DIAGNOSIS — M5030 Other cervical disc degeneration, unspecified cervical region: Secondary | ICD-10-CM

## 2014-11-22 MED ORDER — FLUTICASONE PROPIONATE 50 MCG/ACT NA SUSP: NASAL | Status: DC

## 2014-11-22 MED ORDER — TOPIRAMATE 100 MG PO TABS: 100.0000 mg | ORAL_TABLET | Freq: Every day | ORAL | Status: DC

## 2014-11-22 MED ORDER — PHENYLEPHRINE HCL 10 MG PO TABS: 10.0000 mg | ORAL_TABLET | Freq: Three times a day (TID) | ORAL | Status: DC

## 2014-11-22 MED ORDER — ALPRAZOLAM 1 MG PO TABS: 1.0000 mg | ORAL_TABLET | Freq: Three times a day (TID) | ORAL | Status: DC

## 2014-11-22 NOTE — Assessment & Plan Note (Signed)
Flonase, phenylephrine.

## 2014-11-22 NOTE — Assessment & Plan Note (Signed)
Squamous cell carcinoma post excision with clear margins.

## 2014-11-22 NOTE — Assessment & Plan Note (Signed)
Fantastic response with regards to migraines on Topamax, increasing to 100 mg once a day

## 2014-11-22 NOTE — Progress Notes (Signed)
  Subjective:    CC: follow-up  HPI: Anal cancer: Surgical cure post resection, margins were clear.  Depression: Has not noted an improvement with going from Effexor 150-225, amenable to drop back down to 150, no suicidal or homicidal ideation.  Migraine headache: Good response to Topamax at 50 mg, patient does desire to increase to 100 mg.  Cervical degenerative disc disease post ACDF: left-sided radiculopathy, still needs a follow-up with Dr. Lynann Bologna regarding this.  Headache: Bifrontal, with nasal congestion, moderate, persistent without any aura, visual changes, nausea, constant and pressure like without any throbbing.  Past medical history, Surgical history, Family history not pertinant except as noted below, Social history, Allergies, and medications have been entered into the medical record, reviewed, and no changes needed.   Review of Systems: No fevers, chills, night sweats, weight loss, chest pain, or shortness of breath.   Objective:    General: Well Developed, well nourished, and in no acute distress.  Neuro: Alert and oriented x3, extra-ocular muscles intact, sensation grossly intact.  HEENT: Normocephalic, atraumatic, pupils equal round reactive to light, neck supple, no masses, no lymphadenopathy, thyroid nonpalpable. Oropharynx, nasopharynx, ear canals are unremarkable, minimal tenderness to percussion over the frontal sinuses bilaterally Skin: Warm and dry, no rashes. Cardiac: Regular rate and rhythm, no murmurs rubs or gallops, no lower extremity edema.  Respiratory: Clear to auscultation bilaterally. Not using accessory muscles, speaking in full sentences.  Impression and Recommendations:    I spent 25 minutes with this patient, greater than 50% was face-to-face time counseling regarding the above diagnoses

## 2014-11-22 NOTE — Assessment & Plan Note (Signed)
Decreasing Effexor to 150 mg. She is on multiple high-dose narcotics, I think ultimately we should try to slowly wean her off of everything. Refilling Xanax today.

## 2014-11-22 NOTE — Assessment & Plan Note (Signed)
Needs follow-up with pain management and [facility].

## 2014-11-23 ENCOUNTER — Encounter: Payer: Self-pay | Admitting: *Deleted

## 2014-11-23 ENCOUNTER — Telehealth: Payer: Self-pay | Admitting: *Deleted

## 2014-11-23 LAB — CBC
HCT: 41.6 % (ref 36.0–46.0)
Hemoglobin: 13.5 g/dL (ref 12.0–15.0)
MCH: 27.3 pg (ref 26.0–34.0)
MCHC: 32.5 g/dL (ref 30.0–36.0)
MCV: 84.2 fL (ref 78.0–100.0)
MPV: 9.3 fL (ref 8.6–12.4)
Platelets: 315 K/uL (ref 150–400)
RBC: 4.94 MIL/uL (ref 3.87–5.11)
RDW: 14.8 % (ref 11.5–15.5)
WBC: 6.3 K/uL (ref 4.0–10.5)

## 2014-11-23 LAB — COMPREHENSIVE METABOLIC PANEL WITH GFR
AST: 15 U/L (ref 10–35)
Albumin: 4.2 g/dL (ref 3.6–5.1)
BUN: 15 mg/dL (ref 7–25)
Chloride: 104 mmol/L (ref 98–110)
Creat: 0.93 mg/dL (ref 0.50–1.10)
Glucose, Bld: 104 mg/dL — ABNORMAL HIGH (ref 65–99)
Sodium: 138 mmol/L (ref 135–146)
Total Bilirubin: 0.4 mg/dL (ref 0.2–1.2)

## 2014-11-23 LAB — LIPID PANEL
Cholesterol: 193 mg/dL (ref 125–200)
HDL: 64 mg/dL (ref >=46)
LDL Cholesterol: 112 mg/dL (ref <130)
Total CHOL/HDL Ratio: 3 Ratio (ref <=5.0)
Triglycerides: 87 mg/dL (ref <150)
VLDL: 17 mg/dL (ref <30)

## 2014-11-23 LAB — COMPREHENSIVE METABOLIC PANEL
ALT: 12 U/L (ref 6–29)
Alkaline Phosphatase: 64 U/L (ref 33–115)
CO2: 25 mmol/L (ref 20–31)
Calcium: 9.1 mg/dL (ref 8.6–10.2)
Potassium: 4.1 mmol/L (ref 3.5–5.3)
Total Protein: 6.8 g/dL (ref 6.1–8.1)

## 2014-11-23 LAB — URIC ACID: Uric Acid, Serum: 3.5 mg/dL (ref 2.4–7.0)

## 2014-11-23 LAB — HEMOGLOBIN A1C
Hgb A1c MFr Bld: 5.8 % — ABNORMAL HIGH (ref <5.7)
Mean Plasma Glucose: 120 mg/dL — ABNORMAL HIGH (ref <117)

## 2014-11-23 LAB — VITAMIN D 25 HYDROXY (VIT D DEFICIENCY, FRACTURES): Vit D, 25-Hydroxy: 39 ng/mL (ref 30–100)

## 2014-11-23 LAB — TSH: TSH: 1.669 u[IU]/mL (ref 0.350–4.500)

## 2014-11-23 NOTE — Telephone Encounter (Signed)
"  I'm scheduled for surgery on 08/31.  I just got a letter in the mail for jury duty for 09/13.  I'm sure there's no way I can make it to jury duty.  I need something from the doctor stating I'm having surgery and that I won't be able to make it.  Please give me a call."

## 2014-11-23 NOTE — Telephone Encounter (Signed)
I left a message for patient to return my call on her home number.  I'm returning your call.  You requested a letter for jury duty.  You can come by the office and pick it up.  "I'll have my boyfriend to stop by there and pick it up on tomorrow."

## 2014-11-24 ENCOUNTER — Emergency Department (HOSPITAL_COMMUNITY)
Admission: EM | Admit: 2014-11-24 | Discharge: 2014-11-24 | Discharge status: Against Medical Advice | Payer: BLUE CROSS/BLUE SHIELD | Attending: Emergency Medicine | Admitting: Emergency Medicine

## 2014-11-24 ENCOUNTER — Encounter (HOSPITAL_COMMUNITY): Payer: Self-pay | Admitting: *Deleted

## 2014-11-24 DIAGNOSIS — R61 Generalized hyperhidrosis: Secondary | ICD-10-CM | POA: Diagnosis not present

## 2014-11-24 DIAGNOSIS — F329 Major depressive disorder, single episode, unspecified: Secondary | ICD-10-CM | POA: Insufficient documentation

## 2014-11-24 DIAGNOSIS — R110 Nausea: Secondary | ICD-10-CM | POA: Insufficient documentation

## 2014-11-24 DIAGNOSIS — Z79899 Other long term (current) drug therapy: Secondary | ICD-10-CM | POA: Insufficient documentation

## 2014-11-24 DIAGNOSIS — Z859 Personal history of malignant neoplasm, unspecified: Secondary | ICD-10-CM | POA: Insufficient documentation

## 2014-11-24 DIAGNOSIS — R079 Chest pain, unspecified: Secondary | ICD-10-CM

## 2014-11-24 DIAGNOSIS — Z7951 Long term (current) use of inhaled steroids: Secondary | ICD-10-CM | POA: Insufficient documentation

## 2014-11-24 DIAGNOSIS — F419 Anxiety disorder, unspecified: Secondary | ICD-10-CM | POA: Insufficient documentation

## 2014-11-24 DIAGNOSIS — M25512 Pain in left shoulder: Secondary | ICD-10-CM | POA: Diagnosis not present

## 2014-11-24 DIAGNOSIS — R0789 Other chest pain: Secondary | ICD-10-CM | POA: Insufficient documentation

## 2014-11-24 DIAGNOSIS — M542 Cervicalgia: Secondary | ICD-10-CM | POA: Insufficient documentation

## 2014-11-24 DIAGNOSIS — G43909 Migraine, unspecified, not intractable, without status migrainosus: Secondary | ICD-10-CM | POA: Diagnosis not present

## 2014-11-24 DIAGNOSIS — R0781 Pleurodynia: Secondary | ICD-10-CM

## 2014-11-24 DIAGNOSIS — M549 Dorsalgia, unspecified: Secondary | ICD-10-CM | POA: Insufficient documentation

## 2014-11-24 DIAGNOSIS — Z8719 Personal history of other diseases of the digestive system: Secondary | ICD-10-CM | POA: Insufficient documentation

## 2014-11-24 DIAGNOSIS — Z87891 Personal history of nicotine dependence: Secondary | ICD-10-CM | POA: Insufficient documentation

## 2014-11-24 LAB — BASIC METABOLIC PANEL
Anion gap: 8 (ref 5–15)
BUN: 17 mg/dL (ref 6–20)
CALCIUM: 8.4 mg/dL — AB (ref 8.9–10.3)
CO2: 24 mmol/L (ref 22–32)
Chloride: 107 mmol/L (ref 101–111)
Creatinine, Ser: 0.77 mg/dL (ref 0.44–1.00)
GFR calc Af Amer: >60 mL/min (ref >60)
GLUCOSE: 104 mg/dL — AB (ref 65–99)
Potassium: 3.3 mmol/L — ABNORMAL LOW (ref 3.5–5.1)
Sodium: 139 mmol/L (ref 135–145)

## 2014-11-24 LAB — CBC WITH DIFFERENTIAL/PLATELET
BASOS ABS: 0.1 10*3/uL (ref 0.0–0.1)
Basophils Relative: 1 % (ref 0–1)
EOS PCT: 3 % (ref 0–5)
Eosinophils Absolute: 0.2 10*3/uL (ref 0.0–0.7)
HCT: 34.2 % — ABNORMAL LOW (ref 36.0–46.0)
Hemoglobin: 11.4 g/dL — ABNORMAL LOW (ref 12.0–15.0)
LYMPHS ABS: 3.1 10*3/uL (ref 0.7–4.0)
Lymphocytes Relative: 52 % — ABNORMAL HIGH (ref 12–46)
MCH: 28.1 pg (ref 26.0–34.0)
MCHC: 33.3 g/dL (ref 30.0–36.0)
MCV: 84.4 fL (ref 78.0–100.0)
Monocytes Absolute: 0.5 10*3/uL (ref 0.1–1.0)
Monocytes Relative: 9 % (ref 3–12)
Neutro Abs: 2.1 10*3/uL (ref 1.7–7.7)
Neutrophils Relative %: 35 % — ABNORMAL LOW (ref 43–77)
PLATELETS: 207 10*3/uL (ref 150–400)
RBC: 4.05 MIL/uL (ref 3.87–5.11)
RDW: 14.1 % (ref 11.5–15.5)
WBC: 6 10*3/uL (ref 4.0–10.5)

## 2014-11-24 LAB — I-STAT TROPONIN, ED: Troponin i, poc: 0 ng/mL (ref 0.00–0.08)

## 2014-11-24 LAB — D-DIMER, QUANTITATIVE: D-Dimer, Quant: 0.35 ug/mL-FEU (ref 0.00–0.48)

## 2014-11-24 MED ORDER — NITROGLYCERIN 0.4 MG SL SUBL: 0.4000 mg | SUBLINGUAL_TABLET | SUBLINGUAL | Status: DC | PRN

## 2014-11-24 MED ORDER — ASPIRIN 81 MG PO CHEW: 324.0000 mg | CHEWABLE_TABLET | Freq: Once | ORAL | Status: DC

## 2014-11-24 NOTE — ED Notes (Signed)
Pt. Left with all belongings and refused wheelchair and refused discharge paperwork

## 2014-11-24 NOTE — ED Notes (Signed)
Pt states she had chest pain start at 8pm. Pt. States a substernal chest pain with radiation to neck and back. Pt. Took 324 of baby aspirin. Pt has a hx of anxiety and rectal cancer.

## 2014-11-24 NOTE — Discharge Instructions (Signed)
Return to the ER by calling 911 if your pain gets worse, you have shortness of breath, you have sweats, dizziness, fainting.   Chest Pain (Nonspecific) It is often hard to give a specific diagnosis for the cause of chest pain. There is always a chance that your pain could be related to something serious, such as a heart attack or a blood clot in the lungs. You need to follow up with your health care provider for further evaluation. CAUSES   Heartburn.  Pneumonia or bronchitis.  Anxiety or stress.  Inflammation around your heart (pericarditis) or lung (pleuritis or pleurisy).  A blood clot in the lung.  A collapsed lung (pneumothorax). It can develop suddenly on its own (spontaneous pneumothorax) or from trauma to the chest.  Shingles infection (herpes zoster virus). The chest wall is composed of bones, muscles, and cartilage. Any of these can be the source of the pain.  The bones can be bruised by injury.  The muscles or cartilage can be strained by coughing or overwork.  The cartilage can be affected by inflammation and become sore (costochondritis). DIAGNOSIS  Lab tests or other studies may be needed to find the cause of your pain. Your health care provider may have you take a test called an ambulatory electrocardiogram (ECG). An ECG records your heartbeat patterns over a 24-hour period. You may also have other tests, such as:  Transthoracic echocardiogram (TTE). During echocardiography, sound waves are used to evaluate how blood flows through your heart.  Transesophageal echocardiogram (TEE).  Cardiac monitoring. This allows your health care provider to monitor your heart rate and rhythm in real time.  Holter monitor. This is a portable device that records your heartbeat and can help diagnose heart arrhythmias. It allows your health care provider to track your heart activity for several days, if needed.  Stress tests by exercise or by giving medicine that makes the heart beat  faster. TREATMENT   Treatment depends on what may be causing your chest pain. Treatment may include:  Acid blockers for heartburn.  Anti-inflammatory medicine.  Pain medicine for inflammatory conditions.  Antibiotics if an infection is present.  You may be advised to change lifestyle habits. This includes stopping smoking and avoiding alcohol, caffeine, and chocolate.  You may be advised to keep your head raised (elevated) when sleeping. This reduces the chance of acid going backward from your stomach into your esophagus. Most of the time, nonspecific chest pain will improve within 2-3 days with rest and mild pain medicine.  HOME CARE INSTRUCTIONS   If antibiotics were prescribed, take them as directed. Finish them even if you start to feel better.  For the next few days, avoid physical activities that bring on chest pain. Continue physical activities as directed.  Do not use any tobacco products, including cigarettes, chewing tobacco, or electronic cigarettes.  Avoid drinking alcohol.  Only take medicine as directed by your health care provider.  Follow your health care provider's suggestions for further testing if your chest pain does not go away.  Keep any follow-up appointments you made. If you do not go to an appointment, you could develop lasting (chronic) problems with pain. If there is any problem keeping an appointment, call to reschedule. SEEK MEDICAL CARE IF:   Your chest pain does not go away, even after treatment.  You have a rash with blisters on your chest.  You have a fever. SEEK IMMEDIATE MEDICAL CARE IF:   You have increased chest pain or pain that  spreads to your arm, neck, jaw, back, or abdomen.  You have shortness of breath.  You have an increasing cough, or you cough up blood.  You have severe back or abdominal pain.  You feel nauseous or vomit.  You have severe weakness.  You faint.  You have chills. This is an emergency. Do not wait to  see if the pain will go away. Get medical help at once. Call your local emergency services (911 in [place] Do not drive yourself to the hospital. MAKE SURE YOU:   Understand these instructions.  Will watch your condition.  Will get help right away if you are not doing well or get worse. Document Released: 01/03/2005 Document Revised: 03/31/2013 Document Reviewed: 10/30/2007 [facility] Patient Information 2015 [place], [place]. This information is not intended to replace advice given to you by your health care provider. Make sure you discuss any questions you have with your health care provider.

## 2014-11-24 NOTE — ED Provider Notes (Signed)
CSN: [id number]     Arrival date & time 11/24/14  0027 History  This chart was scribed for Varney Biles, MD by Hansel Feinstein, ED Scribe. This patient was seen in room A06C/A06C and the patient's care was started at 1:13 AM.     Chief Complaint  Patient presents with  . Chest Pain   The history is provided by the patient. No language interpreter was used.    HPI Comments: Stephanie Gregory is a 49 y.o. female who presents to the Emergency Department complaining of moderate, sharp, stabbing, constant CP that radiates to the left neck and shoulder onset 5 hours PTA while at rest. She states associated nausea, diaphoresis. Pt notes that she was doing homework when the symptoms began. Pt states pain is exacerbated in the supine position, bending forward and with deep breathing and is relieved while lying on her side. She notes that she has neck and shoulder pain at baseline that is normally relived with pain medication, but has not seen relief with the current pain. She is a former smoker (quit 2 months ago and used to smoke 0.5 ppd for 30 years). No FHx of CHF/CAD<55yo. No Hx of MI, stroke, PE/DVT. No Hx of similar CP. No recent long travel. She notes hormone therapy with Estradiol, recent outpatient surgery for pre-cancer with no admission to the hospital. She denies vomiting, leg swelling, pain in the calfs.   Past Medical History  Diagnosis Date  . Anxiety   . GERD (gastroesophageal reflux disease)   . Migraine   . Mental disorder     Depression / Anxiety  . Vaginal Pap smear, abnormal     CIN I - History  . SVD (spontaneous vaginal delivery)     x 3  . Depression   . DDD (degenerative disc disease)     has metal plate C4-C5 diskectomy  . Shortness of breath dyspnea     walking distances or climbing stairs  . H/O seasonal allergies     pollen and mold  . Numbness and tingling     left 5th finger and ring finger radiates downward from neck area   . Shaking     with anxiety   . Cancer   .  Bunion of left foot    Past Surgical History  Procedure Laterality Date  . Neck surgery  12/1999    C4-C5 diskectomy  . Nasal sinus surgery  2000  . Dilatation & currettage/hysteroscopy with resectocope N/A 06/16/2013    Procedure: DILATATION & CURETTAGE/HYSTEROSCOPY WITH RESECTOCOPE/CERVICAL POLYPECTOMY;  Surgeon: Sanjuana Kava, MD;  Location: [facility];  Service: Gynecology;  Laterality: N/A;  . Laparoscopy N/A 06/16/2013    Procedure: LAPAROSCOPY OPERATIVE SALPINGO OOPHORECTOMY;  Surgeon: Sanjuana Kava, MD;  Location: [facility];  Service: Gynecology;  Laterality: N/A;  . Tubal ligation  10/2000  . Wisdom tooth extraction    . Cystoscopy N/A 09/15/2013    Procedure: CYSTOSCOPY;  Surgeon: Sanjuana Kava, MD;  Location: [facility];  Service: Gynecology;  Laterality: N/A;  . Laparoscopic assisted vaginal hysterectomy Right 09/15/2013    Procedure: LAPAROSCOPIC ASSISTED VAGINAL HYSTERECTOMY with right salpingo-oophorectomy;  Surgeon: Sanjuana Kava, MD;  Location: [facility];  Service: Gynecology;  Laterality: Right;  . Anterior cervical decomp/discectomy fusion N/A 02/03/2014    Procedure: ANTERIOR CERVICAL DECOMPRESSION/DISCECTOMY FUSION 2 LEVELS;  Surgeon: Sinclair Ship, MD;  Location: [facility];  Service: Orthopedics;  Laterality: N/A;  Anterior cervical decompression fusion, cervical 5-6, cervical 6-7 with instrumentation and allograft  .  Hardware removal N/A 02/03/2014    Procedure: HARDWARE REMOVAL;  Surgeon: Sinclair Ship, MD;  Location: [facility];  Service: Orthopedics;  Laterality: N/A;  removal of hardware cervical 4-5.  Marland Kitchen Anterior cervical decomp/discectomy fusion N/A 06/09/2014    Procedure: ANTERIOR CERVICAL DECOMPRESSION/DISCECTOMY FUSION 1 LEVEL;  Surgeon: Sinclair Ship, MD;  Location: [facility];  Service: Orthopedics;  Laterality: N/A;  Anterior cervical decompression fusion, cervical 3-4 with instrumentation and allograft  . Abdominal hysterectomy    . Dilation and curettage of uterus    . Examination  under anesthesia N/A 10/28/2014    Procedure: EXAM UNDER ANESTHESIA, REMOVAL OF SQUAMOUS CELL CARCINOMA IN SITU OF THE ANUS;  Surgeon: Jackolyn Confer, MD;  Location: [facility];  Service: General;  Laterality: N/A;   History reviewed. No pertinent family history. Social History  Substance Use Topics  . Smoking status: Former Smoker -- 1.00 packs/day for 31 years    Types: Cigarettes    Quit date: 10/08/2014  . Smokeless tobacco: Never Used  . Alcohol Use: No     Comment: past hx of occas use    OB History    No data available     Review of Systems  Constitutional: Positive for diaphoresis.  Cardiovascular: Positive for chest pain. Negative for leg swelling.  Gastrointestinal: Positive for nausea. Negative for vomiting.  Musculoskeletal: Positive for back pain and neck pain.   A complete 10 system review of systems was obtained and all systems are negative except as noted in the HPI and PMH.   Allergies  Cyclobenzaprine and Methocarbamol  Home Medications   Prior to Admission medications   Medication Sig Start Date End Date Taking? Authorizing Provider  ALPRAZolam Duanne Moron) 1 MG tablet Take 1 tablet (1 mg total) by mouth 3 (three) times daily. 11/22/14  Yes Silverio Decamp, MD  docusate sodium (COLACE) 100 MG capsule Take 100 mg by mouth 2 (two) times daily.   Yes Historical Provider, MD  estradiol (ESTRACE) 1 MG tablet Take 1 tablet (1 mg total) by mouth daily. 09/23/14  Yes Silverio Decamp, MD  fluticasone (FLONASE) 50 MCG/ACT nasal spray One spray in each nostril twice a day, use left hand for right nostril, and right hand for left nostril. 11/22/14  Yes Silverio Decamp, MD  hydrOXYzine (VISTARIL) 50 MG capsule Take 1 capsule (50 mg total) by mouth every 6 (six) hours as needed. Patient taking differently: Take 50 mg by mouth every 6 (six) hours as needed for anxiety.  09/23/14  Yes Silverio Decamp, MD  Linaclotide [facility]) 145 MCG CAPS capsule Take 1 capsule  (145 mcg total) by mouth daily. 09/23/14  Yes Silverio Decamp, MD  nicotine (NICODERM CQ) 21 mg/24hr patch 1 patch daily for 6 weeks then 14 mg daily for 2 weeks then 7 mg daily for 2 weeks then stop Patient taking differently: Place 21 mg onto the skin daily.  09/23/14  Yes Silverio Decamp, MD  phenylephrine (SUDAFED PE) 10 MG TABS tablet Take 1 tablet (10 mg total) by mouth every 8 (eight) hours. 11/22/14  Yes Silverio Decamp, MD  polycarbophil (FIBERCON) 625 MG tablet Take 625 mg by mouth 2 (two) times daily.    Yes Historical Provider, MD  PROCTOZONE-HC 2.5 % rectal cream Place 1 application rectally daily as needed for hemorrhoids or itching.  08/19/14  Yes Historical Provider, MD  promethazine (PHENERGAN) 12.5 MG tablet Take 12.5 mg by mouth every 4 (four) hours as needed for  nausea or vomiting.  08/25/14  Yes Historical Provider, MD  rizatriptan (MAXALT) 10 MG tablet Take 10 mg by mouth as needed for migraine. May repeat in 2 hours if needed   Yes Historical Provider, MD  rOPINIRole (REQUIP) 1 MG tablet Take 1 tablet (1 mg total) by mouth at bedtime. 09/23/14  Yes Silverio Decamp, MD  SUMAtriptan ([place]) 6 MG/0.5ML SOAJ Inject transdermal x1, may repeat x1 if no effect in 2 hours 09/30/14  Yes Silverio Decamp, MD  tiZANidine (ZANAFLEX) 4 MG tablet Take 1 tablet by mouth 2 (two) times daily as needed for muscle spasms.  09/14/14  Yes Historical Provider, MD  topiramate (TOPAMAX) 100 MG tablet Take 1 tablet (100 mg total) by mouth daily. 11/22/14  Yes Silverio Decamp, MD  traZODone (DESYREL) 300 MG tablet Take 1 tablet (300 mg total) by mouth at bedtime. 09/23/14  Yes Silverio Decamp, MD  tretinoin (RETIN-A) 0.05 % cream Apply topically at bedtime.   Yes Historical Provider, MD  venlafaxine XR (EFFEXOR-XR) 150 MG 24 hr capsule Use with 75 mg cap Patient taking differently: Take 150 mg by mouth daily with breakfast. Use with 75 mg cap 09/23/14  Yes  Silverio Decamp, MD   LMP 06/09/2013 Physical Exam  Constitutional: She is oriented to person, place, and time. She appears well-developed and well-nourished.  HENT:  Head: Normocephalic and atraumatic.  Eyes: Conjunctivae and EOM are normal. Pupils are equal, round, and reactive to light.  Neck: Normal range of motion. Neck supple. No JVD present.  Cardiovascular: Normal rate, regular rhythm and normal heart sounds.   2+ and equal radial pulse bilaterally.   Pulmonary/Chest: Effort normal. No respiratory distress.  Lungs CTA. Reproducible tenderness over scapular region. No reproducible chest tenderness of the left anterior chest.   Abdominal: Soft. She exhibits no distension.  Musculoskeletal: Normal range of motion.  Neurological: She is alert and oriented to person, place, and time.  Skin: Skin is warm and dry.  Psychiatric: She has a normal mood and affect. Her behavior is normal.  Nursing note and vitals reviewed.   ED Course  Procedures (including critical care time) DIAGNOSTIC STUDIES: Oxygen Saturation is 100% on RA, normal by my interpretation.    COORDINATION OF CARE: 1:21 AM Discussed treatment plan with pt at bedside and pt agreed to plan.   Labs Review Labs Reviewed  CBC WITH DIFFERENTIAL/PLATELET - Abnormal; Notable for the following:    Hemoglobin 11.4 (*)    HCT 34.2 (*)    Neutrophils Relative % 35 (*)    Lymphocytes Relative 52 (*)    All other components within normal limits  BASIC METABOLIC PANEL - Abnormal; Notable for the following:    Potassium 3.3 (*)    Glucose, Bld 104 (*)    Calcium 8.4 (*)    All other components within normal limits  URINALYSIS, ROUTINE W REFLEX MICROSCOPIC (NOT AT [facility])  D-DIMER, QUANTITATIVE (NOT AT [facility])  Randolm Idol, ED    Imaging Review No results found. I have personally reviewed and evaluated these images and lab results as part of my medical decision-making.   EKG Interpretation   Date/Time:   Wednesday November 24 2014 00:34:46 EDT Ventricular Rate:  61 PR Interval:  128 QRS Duration: 96 QT Interval:  460 QTC Calculation: 463 R Axis:   71 Text Interpretation:  Normal sinus rhythm Incomplete right bundle branch  block No significant change since last tracing Reconfirmed by Macoy Rodwell,  MD,  Tamakia Porto [phone]) on 11/24/2014 12:45:02 AM      MDM   Final diagnoses:  Left sided chest pain  Left shoulder pain  Pleuritic chest pain    I personally performed the services described in this documentation, which was scribed in my presence. The recorded information has been reviewed and is accurate.  Pt comes in with cc of chest pain. Chest pain is L sided and there is radiation to the scapular region. The scapular region pain is reproducible. Pt's pain is pleuritic as well. She reports episode of feeling "clammy." No hx of PE, ACS. She is on estrogen therapy, and PE will need to be ruled out. HEAR score is 3 -age (1), hx (1) and risk factors (1).    @2 :71 Patient wants to leave against medical advice. Patient understands that his/her actions will lead to inadequate medical workup, and that he/she is at risk of complications of missed diagnosis, which includes morbidity and mortality.  Patient is demonstrating good capacity to make decision. Patient understands that he/she needs to return to the ER immediately if his/her symptoms get worse.  STRICT RETURN PRECAUTIONS discussed.   Varney Biles, MD 11/24/14 [phone]

## 2014-11-24 NOTE — ED Notes (Signed)
Provider bedside.

## 2014-11-26 ENCOUNTER — Ambulatory Visit: Payer: Self-pay | Admitting: Sports Medicine

## 2014-12-02 ENCOUNTER — Other Ambulatory Visit: Payer: Self-pay | Admitting: Sports Medicine

## 2014-12-06 ENCOUNTER — Telehealth: Payer: Self-pay | Admitting: *Deleted

## 2014-12-06 NOTE — Telephone Encounter (Signed)
I had neck surgery last year.  I have some nerve damage from it. I saw Dr. Eddie Dibbles at [facility] and he gave me a prescription for Percocet 7.5.  He has me taking 2 a day.  I'm scheduled to have surgery on Friday for a bunion procedure.  I heard it is painful.  So, I want to make him aware that I have this medication but not sure if he can call in something stronger or at least prescribe this where I can take it more then twice a day.  I don't want to be in pain.  The pharmacist told me to make you aware so I won't get in trouble.  I can lock what I have up in my boyfriend's locker if I need to.  Do you know what he prescribes for after surgery?"  No, I do not it varies from person to person.  He doesn't give the prescription in advance.  "Okay that's fine.  I'm sure some people may have tried that before and not had the surgery.  I don't like taking pain medication.  I don't want him to think I have a problem or anything.  My pharmacist just suggested I get a note or something so I won't get in trouble."  Okay, I'll let Dr. Jacqualyn Posey know.  "Let me know what he says."

## 2014-12-06 NOTE — Telephone Encounter (Signed)
I will give her Percocet 10/325. Can you please let her PCP know. Thanks.

## 2014-12-07 ENCOUNTER — Encounter: Payer: Self-pay | Admitting: Podiatry

## 2014-12-07 ENCOUNTER — Ambulatory Visit: Payer: Self-pay | Admitting: Cardiology

## 2014-12-07 ENCOUNTER — Encounter: Payer: Self-pay | Admitting: *Deleted

## 2014-12-07 NOTE — Telephone Encounter (Signed)
"  This is Stephanie Gregory again I spoke to my pharmacist again.  He said as long as Dr. Jacqualyn Posey writes on there I am not to take the 7.5 while I'm taking the 10.  He said it will be fine.  Like I said, I'm not the type of person to do that because I value my life.  If you'd just call me back and let me know what's going on I'd appreciate it.  Thank you.

## 2014-12-07 NOTE — Telephone Encounter (Signed)
"  I just reviewed the letter from My-Chart.  I'm responding to it.  I'm sending a letter over to you guys.  If you can read it and get back to me as soon as possible or if you can call me.  Dr. Dianah Field did not prescribe this medication.  It was Dr. Eddie Dibbles.  Look at it and tell me if it is doable or not doable.  Anyway, I'll be home all day.  Call me at your earliest convenience.  I would greatly greatly appreciate it, since my surgery is in the morning.  Dr. Eddie Dibbles is the one who prescribed the pain medication.

## 2014-12-07 NOTE — Telephone Encounter (Signed)
I attempted to call patient and inform her that Dr. Jacqualyn Posey is prescribing Percocet 10/325.

## 2014-12-08 ENCOUNTER — Encounter: Payer: Self-pay | Admitting: *Deleted

## 2014-12-08 DIAGNOSIS — M2012 Hallux valgus (acquired), left foot: Secondary | ICD-10-CM | POA: Diagnosis not present

## 2014-12-10 ENCOUNTER — Encounter: Payer: Self-pay | Admitting: Podiatry

## 2014-12-10 NOTE — Telephone Encounter (Signed)
I contacted pt concerning the medical question online 12/10/2014.  Dr. Jacqualyn Posey states he gave her the Percocet 10mg , and she may not be aware that is the Oxycodone and Tylenol, tell her she can take it every 3 hours.  I told pt Dr. Leigh Aurora recommendations and instructed her to remove the boot, open-ended sock, and ace wrap only, not to touch the gauze, then to elevate the foot and ice for 15 minutes, then place the foot level with hip and rewrap the ace loose, and put open-ended sock, and boot back on, to remain in boot at all times.  Pt asked if she had to keep the pillow under her thigh she can't sleep.  i told her we would rather she rested, so she could remove the pillow.

## 2014-12-14 ENCOUNTER — Encounter: Payer: Self-pay | Admitting: Podiatry

## 2014-12-14 ENCOUNTER — Encounter: Payer: Self-pay | Admitting: Sports Medicine

## 2014-12-14 MED ORDER — OXYCODONE-ACETAMINOPHEN 10-325 MG PO TABS: 1.0000 | ORAL_TABLET | Freq: Three times a day (TID) | ORAL | Status: DC | PRN

## 2014-12-14 NOTE — Telephone Encounter (Addendum)
Pt called as well a left a message of the Pt Question site requesting Percocet 10/325.  Dr. Jacqualyn Posey states refill as previously.  Informed pt the rx would need to be picked up in the [place] office, she states Vawn Icenhower would pick up.

## 2014-12-15 ENCOUNTER — Telehealth: Payer: Self-pay | Admitting: *Deleted

## 2014-12-15 NOTE — Progress Notes (Signed)
Surgery performed at [facility] left foot.  Prescriptiond were written for Keflex 500 mg, take one tablet by mouth 3 times a day for 7 days,quantity 21, 0 refills.  Phenergan 12.5 mg, take 1 tablet by mouth every 6 hours as needed for nausea/ vomiting, quantity 30, 0 refills.  Percocet 10/325, take one tablet by mouth every 4-6 hours as need for pain,quantity 40, 0 refills.  (Do not take Percocet 7.5/325.)

## 2014-12-15 NOTE — Telephone Encounter (Signed)
I called to see how patient was doing.  Mr Stephanie Gregory said patient cannot talk, she has Laryngitis.  I asked him to let her know I was calling to see how she is doing.  "I will let her know.  She's doing okay.  She still has some pain but I told her that is normal to still have some pain.  She's continuing to use the ice.  I'll let her know that you called.  She has an appointment on tomorrow?"  No, she has an appointment on Friday.

## 2014-12-17 ENCOUNTER — Encounter: Payer: Self-pay | Admitting: Podiatry

## 2014-12-17 ENCOUNTER — Ambulatory Visit (INDEPENDENT_AMBULATORY_CARE_PROVIDER_SITE_OTHER): Payer: BLUE CROSS/BLUE SHIELD

## 2014-12-17 ENCOUNTER — Ambulatory Visit (INDEPENDENT_AMBULATORY_CARE_PROVIDER_SITE_OTHER): Payer: BLUE CROSS/BLUE SHIELD | Admitting: Podiatry

## 2014-12-17 VITALS — BP 117/83 | HR 93 | Resp 18

## 2014-12-17 DIAGNOSIS — M2012 Hallux valgus (acquired), left foot: Secondary | ICD-10-CM

## 2014-12-17 DIAGNOSIS — Z9889 Other specified postprocedural states: Secondary | ICD-10-CM

## 2014-12-17 MED ORDER — OXYCODONE-ACETAMINOPHEN 10-325 MG PO TABS
1.0000 | ORAL_TABLET | Freq: Four times a day (QID) | ORAL | Status: DC | PRN
Start: 2014-12-17 — End: 2014-12-27

## 2014-12-17 MED ORDER — IBUPROFEN 800 MG PO TABS: 800.0000 mg | ORAL_TABLET | Freq: Three times a day (TID) | ORAL | Status: DC | PRN

## 2014-12-20 ENCOUNTER — Ambulatory Visit: Payer: Self-pay | Admitting: Sports Medicine

## 2014-12-20 ENCOUNTER — Encounter: Payer: Self-pay | Admitting: Sports Medicine

## 2014-12-20 ENCOUNTER — Other Ambulatory Visit: Payer: Self-pay | Admitting: Sports Medicine

## 2014-12-20 DIAGNOSIS — G43009 Migraine without aura, not intractable, without status migrainosus: Secondary | ICD-10-CM

## 2014-12-20 DIAGNOSIS — F331 Major depressive disorder, recurrent, moderate: Secondary | ICD-10-CM

## 2014-12-20 MED ORDER — ALPRAZOLAM 1 MG PO TABS: 1.0000 mg | ORAL_TABLET | Freq: Three times a day (TID) | ORAL | Status: DC

## 2014-12-20 MED ORDER — TOPIRAMATE 100 MG PO TABS: 100.0000 mg | ORAL_TABLET | Freq: Every day | ORAL | Status: DC

## 2014-12-20 NOTE — Progress Notes (Signed)
Patient ID: Stephanie Gregory, female   DOB: 01/30/1966, 49 y.o.   MRN: [id number]  DOS: 12/08/14 s/p Left Altamese [place]  Subjective: 49 year old female presents the office today one week status post left foot bunionectomy. She says that overall she is doing on her pain is better controlled although she is out of pain medication asking for refill. She is continuing the CAM boot with the use of crutches as needed. She is completed her course of antibiotics. She denies any systemic complaints such as fevers, chills, nausea, vomiting. Denies any calf pain, chest pain, shortness of breath. No other complaints at this time.  Objective: AAO 3, NAD; dressings are clean, dry, intact. DP/PT pulses palpable, CRT less than 3 seconds Protective sensation appears to be intact with Derrel Nip monofilament Incision along the dorsal medial aspect the left foot along the bunion is well coapted without any evidence of dehiscence. There is a faint rim of erythema likely from inflammation on the incision as opposed to infection. There is no ascending cellulitis, increase in warmth, drainage/purulence, malodor. There is mild edema about surgical site with mild tenderness to palpation. There are no other open lesions or pre-ulcerative lesions. There is no pain with calf compression, swelling, warmth, erythema.  Assessment: 49 year old female 1 week status post left foot bunion surgery  Plan: -X-rays were obtained and reviewed with the patient.  -Treatment options discussed including all alternatives, risks, and complications -Antibiotic ointment was placed over the incision followed by dry sterile dressing. Keep the dressing clean, dry, intact. -Continue with CAM walker. Watch for signs or symptoms of DVT/PE and directed to the emergency room should any occur. -Pain medication as needed. This was refilled today. Can take anti-inflammatories as well. -Ice and elevation. -Monitor for any clinical signs or  symptoms of infection and directed to call the office immediately should any occur or go to the ER. -Follow-up in 1 week for suture removal or sooner if any problems arise. In the meantime, encouraged to call the office with any questions, concerns, change in symptoms.   Celesta Gentile, DPM

## 2014-12-20 NOTE — Addendum Note (Signed)
Addended by: Silverio Decamp on: 12/20/2014 03:37 PM   Modules accepted: Orders

## 2014-12-20 NOTE — Telephone Encounter (Signed)
Dr. Darene Lamer, the following Pt MyChart message has been addressed. The following response was sent to the Pt and Xanax refill is in your box for signature:    We received your refill request for Xanax and Topamax. The Xanax refill is a couple days early (last refill was on 11/22/14), we will send over a refill just try and make sure this Rx is lasting you a full 30 days. The Topamax Rx was last filled on 11/22/14 for a 90-day supply. It is not time for a refill on that Rx yet. The Xanax will be faxed to [facility].

## 2014-12-24 ENCOUNTER — Ambulatory Visit (INDEPENDENT_AMBULATORY_CARE_PROVIDER_SITE_OTHER): Payer: BLUE CROSS/BLUE SHIELD | Admitting: Podiatry

## 2014-12-24 VITALS — BP 113/65 | HR 84 | Resp 16

## 2014-12-24 DIAGNOSIS — M2012 Hallux valgus (acquired), left foot: Secondary | ICD-10-CM | POA: Diagnosis not present

## 2014-12-24 DIAGNOSIS — Z9889 Other specified postprocedural states: Secondary | ICD-10-CM

## 2014-12-24 MED ORDER — OXYCODONE-ACETAMINOPHEN 10-325 MG PO TABS: 1.0000 | ORAL_TABLET | Freq: Three times a day (TID) | ORAL | Status: DC | PRN

## 2014-12-27 ENCOUNTER — Telehealth: Payer: Self-pay | Admitting: *Deleted

## 2014-12-27 MED ORDER — OXYCODONE-ACETAMINOPHEN 7.5-325 MG PO TABS: 1.0000 | ORAL_TABLET | ORAL | Status: DC | PRN

## 2014-12-27 NOTE — Telephone Encounter (Signed)
Pt states she will run out of the Percocet 10 tomorrow, and Dr. Jacqualyn Posey states he will write her for Percocet 7.5mg .  DR. Wagoner orders Percocet 7.5mg  #30 1 tablet every 8 hours, and pt needs to return to pain management doctor.  I nformed pt she could pickup the rx in the [facility] office in the morning and to go ahead and make an appt with her pain management doctor now.  Pt states understanding.

## 2014-12-28 ENCOUNTER — Telehealth: Payer: Self-pay | Admitting: *Deleted

## 2014-12-28 NOTE — Telephone Encounter (Signed)
Cannot be filled until it is time. She needs to also be seen by her pain management doctor.

## 2014-12-28 NOTE — Telephone Encounter (Signed)
Stephanie Gregory states pt has received #155 Percocet 10 mg since 12/07/2014, and if pt has called again for a prescription, and it is given today it is eleven days early.  As I was going to the Prescription Hold Box, the pt's friend came in to pick up the 7.5 Percocet.  I asked to speak with the pt, he said she was out in the car.  I told the pt the pharmacist stated the rx was 11 days early, and I would not fill again until 01/08/2015 after again checking with DR. Wagoner.

## 2014-12-29 NOTE — Progress Notes (Signed)
Patient ID: LEANNE SISLER, female   DOB: 1965/11/25, 49 y.o.   MRN: [id number]  DOS: 12/08/14 s/p Left Altamese [place]  Subjective: 49 year old female presents the office today 2 weeks status post left foot bunionectomy. She says that overall she is doing well. She is continuing on Percocet 10/325 which helps her pain although it does continue. She is asking for a refill today. She continues with the CAM boot. She denies any systemic complaints such as fevers, chills, nausea, vomiting. Denies any calf pain, chest pain, shortness of breath. No other complaints at this time.  Objective: AAO 3, NAD; dressings are clean, dry, intact. DP/PT pulses palpable, CRT less than 3 seconds Protective sensation appears to be intact with Derrel Nip monofilament Incision along the dorsal medial aspect the left foot along the bunion is well coapted without any evidence of dehiscence. There is a faint rim of erythema likely from inflammation on the incision as opposed to infection. This appears to be decreased compared to last appointment. There is no ascending cellulitis, increase in warmth, drainage/purulence, malodor. There is mild edema about surgical site with mild tenderness to palpation. There is mild discomfort with MTPJ ROM of the 1st. There are no other open lesions or pre-ulcerative lesions. There is no pain with calf compression, swelling, warmth, erythema.  Assessment: 49 year old female 2 weeks status post left foot bunion surgery  Plan:  -Treatment options discussed including all alternatives, risks, and complications -Suture ends were cut. Antibiotic ointment was placed over the incision followed by dry sterile dressing. Keep the dressing clean, dry, intact. She can start to shower tomorrow is long the incision remains coapted. If is any proximal of the incision to hold off on showering call the office immediately. -Continue with CAM walker. Watch for signs or symptoms of DVT/PE and directed  to the emergency room should any occur. -Pain medication as needed. This was refilled today. Can take anti-inflammatories as well. -Ice and elevation. -Monitor for any clinical signs or symptoms of infection and directed to call the office immediately should any occur or go to the ER. -Follow-up in 2 weeks or sooner if any problems arise. In the meantime, encouraged to call the office with any questions, concerns, change in symptoms.  -X-ray next appointment.   Celesta Gentile, DPM

## 2014-12-29 NOTE — Telephone Encounter (Signed)
DR. Jacqualyn Posey,  I tore up the rx and cancelled in the computer until proper time, and I have referred her to her pain management doctor this time and previously.  Marcy Siren

## 2015-01-01 ENCOUNTER — Encounter: Payer: Self-pay | Admitting: Podiatry

## 2015-01-01 ENCOUNTER — Encounter: Payer: Self-pay | Admitting: Sports Medicine

## 2015-01-03 ENCOUNTER — Ambulatory Visit (INDEPENDENT_AMBULATORY_CARE_PROVIDER_SITE_OTHER): Payer: BLUE CROSS/BLUE SHIELD

## 2015-01-03 ENCOUNTER — Encounter: Payer: Self-pay | Admitting: Podiatry

## 2015-01-03 ENCOUNTER — Ambulatory Visit (INDEPENDENT_AMBULATORY_CARE_PROVIDER_SITE_OTHER): Payer: BLUE CROSS/BLUE SHIELD | Admitting: Podiatry

## 2015-01-03 DIAGNOSIS — M2012 Hallux valgus (acquired), left foot: Secondary | ICD-10-CM

## 2015-01-03 DIAGNOSIS — Z9889 Other specified postprocedural states: Secondary | ICD-10-CM

## 2015-01-03 MED ORDER — OXYCODONE-ACETAMINOPHEN 10-325 MG PO TABS: 1.0000 | ORAL_TABLET | Freq: Four times a day (QID) | ORAL | Status: DC | PRN

## 2015-01-03 NOTE — Telephone Encounter (Signed)
PATIENT WANTED TO UPDATE PHARMACY FROM [facility] TO [facility]. Stephanie Gregory,CMA

## 2015-01-03 NOTE — Progress Notes (Signed)
Patient ID: Stephanie Gregory, female   DOB: Jul 17, 1965, 49 y.o.   MRN: [id number]  DOS: 12/08/14 s/p Left Altamese [place]  Subjective: 49 year old female presents the office today  status post left foot bunionectomy. She states that yesterday she bends her big toe back of her surgical site and she has had some pain in the area. She also states that she has had no pain medicine since Wednesday and she has had pain to the area. Sh is asking for pain medicine as time. The prescription I gave her last time was not filled. She's been continuing the CAM boot. She has been icing and elevating. She denies any systemic complaints as fevers, chills, nausea, vomiting. No calf pain, chest pain, shortness of breath. No other complaints at this time in no acute changes.  Objective: AAO 3, NAD; dressings are clean, dry, intact. DP/PT pulses palpable, CRT less than 3 seconds Protective sensation appears to be intact with Derrel Nip monofilament Incision along the dorsal medial aspect the left foot along the bunion is well coapted without any evidence of dehiscence and a scar has formed. There is no surrounding, Ascending cellulitis, fluctuance, crepitus, odor, drainage. There is no clinical signs of infection at this time. There is mild tarsal patient along the surgical site. There is mild discomfort with first MTPJ range of motion. Patella sits in a slightly abducted position however the bunion has been corrected. No other areas of tenderness to bilateral lower extremity is. No open lesions or pre-ulcerative lesions. There is no pain with calf compression counseling, warmth, erythema.   Assessment: 49 year old female 3 weeks status post left foot bunion surgery  Plan:  -Treatment options discussed including all alternatives, risks, and complications -Continue with CAM walker. Watch for signs or symptoms of DVT/PE and directed to the emergency room should any occur. -Pain medication as needed. This was  refilled today. After this prescription today we'll hopefully go back to 7.5/325 for which she was taking prior to surgery. -Ice and elevation. -Total bladder or vitamin E cream over the incision daily. -Monitor for any clinical signs or symptoms of infection and directed to call the office immediately should any occur or go to the ER. -Follow-up in 2 weeks or sooner if any problems arise. In the meantime, encouraged to call the office with any questions, concerns, change in symptoms.  -X-ray next appointment.  May transition to Darco shoe next appointment.  Celesta Gentile, DPM

## 2015-01-07 ENCOUNTER — Encounter: Payer: Self-pay | Admitting: Podiatry

## 2015-01-07 ENCOUNTER — Ambulatory Visit (INDEPENDENT_AMBULATORY_CARE_PROVIDER_SITE_OTHER): Payer: BLUE CROSS/BLUE SHIELD | Admitting: Podiatry

## 2015-01-07 VITALS — BP 115/54 | HR 68 | Temp 96.2°F | Resp 18

## 2015-01-07 DIAGNOSIS — Z9889 Other specified postprocedural states: Secondary | ICD-10-CM

## 2015-01-07 DIAGNOSIS — M2012 Hallux valgus (acquired), left foot: Secondary | ICD-10-CM

## 2015-01-07 MED ORDER — CEPHALEXIN 500 MG PO CAPS: 500.0000 mg | ORAL_CAPSULE | Freq: Three times a day (TID) | ORAL | Status: DC

## 2015-01-07 MED ORDER — OXYCODONE-ACETAMINOPHEN 7.5-325 MG PO TABS: 1.0000 | ORAL_TABLET | Freq: Four times a day (QID) | ORAL | Status: DC | PRN

## 2015-01-10 ENCOUNTER — Encounter: Payer: BLUE CROSS/BLUE SHIELD | Admitting: Podiatry

## 2015-01-10 NOTE — Progress Notes (Signed)
Patient ID: Stephanie Gregory, female   DOB: [birth year], 49 y.o.   MRN: [id number]  DOS: 12/08/14 s/p Left Altamese [place]  Subjective: 49 year old female presents the office today  status post left foot bunionectomy. She called the office this morning stating that she was having some drainage from the incision that was somewhat red. She does note a small to clear drainage coming from the inferior portion of the incision. She states that she continues to have pain in the surgical site. She is continuing the CAM boot. She has been icing and elevating. She denies any systemic complaints as fevers, chills, nausea, vomiting. No calf pain, chest pain, shortness of breath. No other complaints at this time in no acute changes.  Objective: AAO 3, NAD; dressings are clean, dry, intact. DP/PT pulses palpable, CRT less than 3 seconds Protective sensation appears to be intact with Derrel Nip monofilament Incision along the dorsal medial aspect the left foot along the bunion is coapted. Along the very inferior portion of the incision there was a scab. Upon debridement there was small amount of clear drainage expressed however there is no purulence. This area is overweight the suture was tied. There is a small superficial opening in the skin on the inferior portion incision overlying this area. There is a faint rim of erythema around the incision, however appears be more pink in color as opposed erythema. There is no ascending cellulitis. There is mild edema overlying the surgical site. There is mild to palpation along the surgery. No other areas of tenderness to bilateral lower extremities. No open lesions or pre-ulcerative lesions. There is no pain with calf compression counseling, warmth, erythema.   Assessment: 49 year old female  status post left foot bunion surgery  Plan:  -Treatment options discussed including all alternatives, risks, and complications -Continue with CAM walker. Watch for signs or  symptoms of DVT/PE and directed to the emergency room should any occur. -Pain medication as needed. She was asking me switchbacks a lower dose. Prescribed 7.5 Percocet. She is to follow-up with her pain management specialist for now on. -Ice and elevation. -Scab overlying the area on the inferior incision was debrided. Recommended Epson salt soaks daily. Prescribed Keflex -Monitor for any clinical signs or symptoms of infection and directed to call the office immediately should any occur or go to the ER. -Follow-up in 2 weeks or sooner if any problems arise. In the meantime, encouraged to call the office with any questions, concerns, change in symptoms.  -X-ray next appointment.  May transition to Darco shoe next appointment.  Celesta Gentile, DPM

## 2015-01-13 ENCOUNTER — Encounter: Payer: Self-pay | Admitting: Sports Medicine

## 2015-01-17 ENCOUNTER — Other Ambulatory Visit: Payer: Self-pay | Admitting: Sports Medicine

## 2015-01-18 ENCOUNTER — Encounter: Payer: Self-pay | Admitting: Sports Medicine

## 2015-01-18 ENCOUNTER — Encounter: Payer: Self-pay | Admitting: Podiatry

## 2015-01-18 ENCOUNTER — Other Ambulatory Visit: Payer: Self-pay | Admitting: Sports Medicine

## 2015-01-18 ENCOUNTER — Ambulatory Visit (INDEPENDENT_AMBULATORY_CARE_PROVIDER_SITE_OTHER): Payer: BLUE CROSS/BLUE SHIELD

## 2015-01-18 ENCOUNTER — Ambulatory Visit (INDEPENDENT_AMBULATORY_CARE_PROVIDER_SITE_OTHER): Payer: BLUE CROSS/BLUE SHIELD | Admitting: Podiatry

## 2015-01-18 VITALS — BP 105/60 | HR 76 | Resp 12

## 2015-01-18 DIAGNOSIS — M2012 Hallux valgus (acquired), left foot: Secondary | ICD-10-CM

## 2015-01-18 DIAGNOSIS — Z9889 Other specified postprocedural states: Secondary | ICD-10-CM

## 2015-01-18 MED ORDER — OXYCODONE-ACETAMINOPHEN 7.5-325 MG PO TABS: 1.0000 | ORAL_TABLET | Freq: Four times a day (QID) | ORAL | Status: DC | PRN

## 2015-01-20 NOTE — Progress Notes (Signed)
Patient ID: Stephanie Gregory, female   DOB: 08-19-1965, 49 y.o.   MRN: [id number]  Subjective: Stephanie Gregory is a 49 y.o. fe/female patient seen today in office S/P left foot [place] bunionectomy performed on 12/08/2014. Patient states that she continues to have some pain over on the surgical site. She does continue with the cam boot at all times. She says she'll course of antibiotics today. She is out of pain medication she states that she does not see her pain management physician until next week. Shedenies calf pain, denies headache, chest pain, shortness of breath, nausea, vomiting, fever, or chills. No other issues noted.   Objective: Vitals: Reviewed  AAO 3, NAD DP/PT pulses 2/4 bilaterally, CRT less than 3 seconds Protective sensation intact with Simms Weinstein monofilament Incision is well coapted without any evidence of dehiscence. There is no surrounding erythema, ascending cellulitis, fluctuance, crepitus, malodor, drainage/purulence. On the distal portion of the incision there is a small hyperkeratotic lesion present with the drainage was present previously. Upon debridement there is no drainage or purulence expressed. There is mild tenderness to palpation on surgical site. There is decreased edema. There is no scissoring erythema. No other areas of tenderness bilateral lower chemise. There is mild discomfort with first MTPJ range of motion. No pain with calf compression, swelling, warmth, erythema. No other open lesions or pre-ulcerative lesions.  Assessment and Plan:  Problem List Items Addressed This Visit    None    Visit Diagnoses    HAV (hallux abducto valgus), left    -  Primary    Relevant Orders    DG Foot Complete Left       Status post Left Stephanie Gregory, doing well but with continued pain   -Patient seen and evaluated -X-rays were obtained and reviewed with the patient.  -Continue with cocoa butter or vitamin E cream of the incision daily. -Pain medication as  needed. I prescribed Percocet again today which she is followed up with her pain management physician next week. -Continue ice and elevation -Continue surgical shoe which was dispensed today. -Monitor for any clinical signs or symptoms of infection and directed to call the office immediately should any occur or go to the ER. -Follow-up in 2 weeks or sooner if any problems arise. In the meantime, encouraged to call the office with any questions, concerns, change in symptoms.   Stephanie Gregory, DPM

## 2015-01-31 ENCOUNTER — Encounter: Payer: Self-pay | Admitting: Sports Medicine

## 2015-01-31 ENCOUNTER — Encounter: Payer: BLUE CROSS/BLUE SHIELD | Admitting: Podiatry

## 2015-01-31 ENCOUNTER — Other Ambulatory Visit: Payer: Self-pay | Admitting: Sports Medicine

## 2015-02-01 ENCOUNTER — Encounter: Payer: BLUE CROSS/BLUE SHIELD | Admitting: Podiatry

## 2015-02-03 ENCOUNTER — Ambulatory Visit: Payer: Self-pay | Admitting: Cardiovascular Disease

## 2015-02-16 ENCOUNTER — Telehealth: Payer: Self-pay | Admitting: Sports Medicine

## 2015-02-16 DIAGNOSIS — F1120 Opioid dependence, uncomplicated: Secondary | ICD-10-CM | POA: Insufficient documentation

## 2015-02-16 NOTE — Telephone Encounter (Signed)
Patient called office requesting an Rx to help her with pain pill addiction. She is trying to stop "taking pills." Will route to PCP for review.

## 2015-02-16 NOTE — Assessment & Plan Note (Signed)
Referral for addiction management

## 2015-02-16 NOTE — Telephone Encounter (Signed)
Attempted to contact Pt to inform, no answer and no voicemail set up.

## 2015-02-16 NOTE — Telephone Encounter (Signed)
Referral to [facility]

## 2015-02-17 ENCOUNTER — Encounter: Payer: Self-pay | Admitting: Sports Medicine

## 2015-02-18 ENCOUNTER — Telehealth: Payer: Self-pay | Admitting: Sports Medicine

## 2015-02-18 NOTE — Telephone Encounter (Signed)
Waiting for PCP approval.

## 2015-02-18 NOTE — Telephone Encounter (Signed)
Pt called. She is asking for refill on Xanax. She gave me her new telephone number, [phone].

## 2015-02-21 ENCOUNTER — Encounter: Payer: Self-pay | Admitting: Sports Medicine

## 2015-02-21 ENCOUNTER — Other Ambulatory Visit: Payer: Self-pay | Admitting: Sports Medicine

## 2015-02-21 ENCOUNTER — Ambulatory Visit (INDEPENDENT_AMBULATORY_CARE_PROVIDER_SITE_OTHER): Payer: BLUE CROSS/BLUE SHIELD | Admitting: Sports Medicine

## 2015-02-21 VITALS — BP 108/67 | HR 82 | Temp 97.5°F

## 2015-02-21 DIAGNOSIS — F1120 Opioid dependence, uncomplicated: Secondary | ICD-10-CM | POA: Diagnosis not present

## 2015-02-21 DIAGNOSIS — I808 Phlebitis and thrombophlebitis of other sites: Secondary | ICD-10-CM | POA: Diagnosis not present

## 2015-02-21 MED ORDER — ALPRAZOLAM 1 MG PO TABS: ORAL_TABLET | ORAL | Status: DC

## 2015-02-21 MED ORDER — PROMETHAZINE HCL 12.5 MG PO TABS: 12.5000 mg | ORAL_TABLET | ORAL | Status: DC | PRN

## 2015-02-21 MED ORDER — ASPIRIN EC 325 MG PO TBEC
325.0000 mg | DELAYED_RELEASE_TABLET | Freq: Two times a day (BID) | ORAL | Status: DC
Start: 2015-02-21 — End: 2015-10-27

## 2015-02-21 MED ORDER — SULFAMETHOXAZOLE-TRIMETHOPRIM 800-160 MG PO TABS
1.0000 | ORAL_TABLET | Freq: Two times a day (BID) | ORAL | Status: DC
Start: 2015-02-21 — End: 2015-02-28

## 2015-02-21 NOTE — Assessment & Plan Note (Signed)
Finishing doxycycline, adding Septra, use warm compresses. Also adding aspirin 325 twice a day.

## 2015-02-21 NOTE — Progress Notes (Signed)
  Subjective:    CC: Follow-up  HPI: Left elbow pain: Was in the hospital in [place], treated for community-acquired pneumonia with IV antibiotics, she was discharged and developed swelling as well as a palpable, tender cord on her anterior left elbow in the cubital fossa where she had her IV placed, she declines any additional illicit drug use in this location. She was given a course of doxycycline and is nearly finished, still having significant swelling.  Anxiety: Needs a refill on Xanax  Narcotic addiction: Needs referral to behavioral health.  Past medical history, Surgical history, Family history not pertinant except as noted below, Social history, Allergies, and medications have been entered into the medical record, reviewed, and no changes needed.   Review of Systems: No fevers, chills, night sweats, weight loss, chest pain, or shortness of breath.   Objective:    General: Well Developed, well nourished, and in no acute distress.  Neuro: Alert and oriented x3, extra-ocular muscles intact, sensation grossly intact.  HEENT: Normocephalic, atraumatic, pupils equal round reactive to light, neck supple, no masses, no lymphadenopathy, thyroid nonpalpable.  Skin: Warm and dry, no rashes. Cardiac: Regular rate and rhythm, no murmurs rubs or gallops, no lower extremity edema.  Respiratory: Clear to auscultation bilaterally. Not using accessory muscles, speaking in full sentences. Left elbow: Minimally erythematous, mild induration with a tender, palpable cord corresponding to the cubital vein with extension proximally to the cephalic vein.  Impression and Recommendations:    I spent 25 minutes with this patient, greater than 50% was face-to-face time counseling regarding the above diagnoses

## 2015-02-21 NOTE — Assessment & Plan Note (Signed)
Referral to behavioral health

## 2015-02-27 ENCOUNTER — Encounter: Payer: Self-pay | Admitting: Sports Medicine

## 2015-02-28 ENCOUNTER — Encounter: Payer: Self-pay | Admitting: Sports Medicine

## 2015-02-28 ENCOUNTER — Ambulatory Visit (INDEPENDENT_AMBULATORY_CARE_PROVIDER_SITE_OTHER): Payer: BLUE CROSS/BLUE SHIELD | Admitting: Family Medicine

## 2015-02-28 ENCOUNTER — Ambulatory Visit: Payer: Self-pay | Admitting: Sports Medicine

## 2015-02-28 ENCOUNTER — Encounter: Payer: Self-pay | Admitting: Family Medicine

## 2015-02-28 VITALS — BP 113/56 | HR 67 | Temp 97.7°F | Wt 126.0 lb

## 2015-02-28 DIAGNOSIS — L270 Generalized skin eruption due to drugs and medicaments taken internally: Secondary | ICD-10-CM | POA: Insufficient documentation

## 2015-02-28 MED ORDER — PREDNISONE 10 MG (48) PO TBPK: ORAL_TABLET | ORAL | Status: DC

## 2015-02-28 MED ORDER — METHYLPREDNISOLONE ACETATE 80 MG/ML IJ SUSP
80.0000 mg | Freq: Once | INTRAMUSCULAR | Status: AC
  Administered 2015-02-28: 80 mg via INTRAMUSCULAR

## 2015-02-28 NOTE — Patient Instructions (Addendum)
Thank you for coming in today. Take prednisone as directed. Take hydroxyzine every 6 hours.  Take over-the-counter Pepcid or Zantac twice daily. Use over-the-counter Gold Bond inch as needed for temporary control of itching Return or go to the emergency room if worsening Follow-up with Dr. Darene Lamer or myself next week Call or go to the emergency room if you get worse, have trouble breathing, have chest pains, or palpitations.   Drug Rash A drug rash is a change in the color or texture of the skin that is caused by a drug. It can develop minutes, hours, or days after the person takes the drug. CAUSES This condition is usually caused by a drug allergy. It can also be caused by exposure to sunlight after taking a drug that makes the skin sensitive to light. Drugs that commonly cause rashes include:  Penicillin.  Antibiotic medicines.  Medicines that treat seizures.  Medicines that treat cancer (chemotherapy).  Aspirin and other nonsteroidal anti-inflammatory drugs (NSAIDs).  Injectable dyes that contain iodine.  Insulin. SYMPTOMS Symptoms of this condition include:  Redness.  Tiny bumps.  Peeling.  Itching.  Itchy welts (hives).  Swelling. The rash may appear on a small area of skin or all over the body. DIAGNOSIS To diagnose the condition, your health care provider will do a physical exam. He or she may also order tests to find out which drug caused the rash. Tests to find the cause of a rash include:  Skin tests.  Blood tests.  Drug challenge. For this test, you stop taking all of the drugs that you do not need to take, and then you start taking them again by adding back one of the drugs at a time. TREATMENT A drug rash may be treated with medicines, including:  Antihistamines. These may be given to relieve itching.  An NSAID. This may be given to reduce swelling and treat pain.  A steroid drug. This may be given to reduce swelling. The rash usually goes away when  the person stops taking the drug that caused it. HOME CARE INSTRUCTIONS  Take medicines only as directed by your health care provider.  Let all of your health care providers know about any drug reactions you have had in the past.  If you have hives, take a cool shower or use a cool compress to relieve itchiness. SEEK MEDICAL CARE IF:  You have a fever.  Your rash is not going away.  Your rash gets worse.  Your rash comes back.  You have wheezing or coughing. SEEK IMMEDIATE MEDICAL CARE IF:  You start to have breathing problems.  You start to have shortness of breath.  You face or throat starts to swell.  You have severe weakness with dizziness or fainting.  You have chest pain.   This information is not intended to replace advice given to you by your health care provider. Make sure you discuss any questions you have with your health care provider.   Document Released: 05/03/2004 Document Revised: 04/16/2014 Document Reviewed: 01/20/2014 Elsevier Interactive Patient Education [employer].

## 2015-02-28 NOTE — Assessment & Plan Note (Signed)
Due to Bactrim antibiotic. This medication is been discontinued and placed on medication and allergy list. Treat with Depo-Medrol, prednisone, hydroxyzine, and Zantac. Use Gold Bond Itch as needed. Follow-up next week

## 2015-02-28 NOTE — Progress Notes (Signed)
Stephanie Gregory is a 49 y.o. female who presents to [facility]  today for drug rash.   Patient developed superficial thrombophlebitis of the left arm a few weeks ago. She ultimately was treated with doxycycline which helped but symptoms continued. She was seen on November 14 by her primary care provider who switched from doxycycline to Bactrim. 2 days ago she developed itching and a rash. She discontinued the Bactrim. She's tried Benadryl which has not helped. She denies any mucocutaneous involvement. No fevers chills nausea vomiting or diarrhea. Itching is severe and quite bothersome.  She notes the thrombophlebitis has almost completely resolved and is no longer painful  Past Medical History  Diagnosis Date  . Anxiety   . GERD (gastroesophageal reflux disease)   . Migraine   . Mental disorder     Depression / Anxiety  . Vaginal Pap smear, abnormal     CIN I - History  . SVD (spontaneous vaginal delivery)     x 3  . Depression   . DDD (degenerative disc disease)     has metal plate C4-C5 diskectomy  . Shortness of breath dyspnea     walking distances or climbing stairs  . H/O seasonal allergies     pollen and mold  . Numbness and tingling     left 5th finger and ring finger radiates downward from neck area   . Shaking     with anxiety   . Cancer ([place])   . Bunion of left foot    Past Surgical History  Procedure Laterality Date  . Neck surgery  12/1999    C4-C5 diskectomy  . Nasal sinus surgery  2000  . Dilatation & currettage/hysteroscopy with resectocope N/A 06/16/2013    Procedure: DILATATION & CURETTAGE/HYSTEROSCOPY WITH RESECTOCOPE/CERVICAL POLYPECTOMY;  Surgeon: Sanjuana Kava, MD;  Location: [facility];  Service: Gynecology;  Laterality: N/A;  . Laparoscopy N/A 06/16/2013    Procedure: LAPAROSCOPY OPERATIVE SALPINGO OOPHORECTOMY;  Surgeon: Sanjuana Kava, MD;  Location: [facility];  Service: Gynecology;  Laterality: N/A;  . Tubal ligation  10/2000  .  Wisdom tooth extraction    . Cystoscopy N/A 09/15/2013    Procedure: CYSTOSCOPY;  Surgeon: Sanjuana Kava, MD;  Location: [facility];  Service: Gynecology;  Laterality: N/A;  . Laparoscopic assisted vaginal hysterectomy Right 09/15/2013    Procedure: LAPAROSCOPIC ASSISTED VAGINAL HYSTERECTOMY with right salpingo-oophorectomy;  Surgeon: Sanjuana Kava, MD;  Location: [facility];  Service: Gynecology;  Laterality: Right;  . Anterior cervical decomp/discectomy fusion N/A 02/03/2014    Procedure: ANTERIOR CERVICAL DECOMPRESSION/DISCECTOMY FUSION 2 LEVELS;  Surgeon: Sinclair Ship, MD;  Location: [facility];  Service: Orthopedics;  Laterality: N/A;  Anterior cervical decompression fusion, cervical 5-6, cervical 6-7 with instrumentation and allograft  . Hardware removal N/A 02/03/2014    Procedure: HARDWARE REMOVAL;  Surgeon: Sinclair Ship, MD;  Location: [facility];  Service: Orthopedics;  Laterality: N/A;  removal of hardware cervical 4-5.  Marland Kitchen Anterior cervical decomp/discectomy fusion N/A 06/09/2014    Procedure: ANTERIOR CERVICAL DECOMPRESSION/DISCECTOMY FUSION 1 LEVEL;  Surgeon: Sinclair Ship, MD;  Location: [facility];  Service: Orthopedics;  Laterality: N/A;  Anterior cervical decompression fusion, cervical 3-4 with instrumentation and allograft  . Abdominal hysterectomy    . Dilation and curettage of uterus    . Examination under anesthesia N/A 10/28/2014    Procedure: EXAM UNDER ANESTHESIA, REMOVAL OF SQUAMOUS CELL CARCINOMA IN SITU OF THE ANUS;  Surgeon: Jackolyn Confer, MD;  Location: [facility];  Service: General;  Laterality: N/A;   Social History  Substance Use Topics  . Smoking status: Former Smoker -- 1.00 packs/day for 31 years    Types: Cigarettes    Quit date: 10/08/2014  . Smokeless tobacco: Never Used  . Alcohol Use: No     Comment: past hx of occas use    family history is not on file.  ROS as above Medications: Current Outpatient Prescriptions  Medication Sig Dispense Refill  . ALPRAZolam  (XANAX) 1 MG tablet TAKE ONE (1) TABLET BY MOUTH THREE (3) TIMES EACH DAY 90 tablet 0  . aspirin EC 325 MG tablet Take 1 tablet (325 mg total) by mouth 2 (two) times daily. 30 tablet 0  . docusate sodium (COLACE) 100 MG capsule Take 100 mg by mouth 2 (two) times daily.    Marland Kitchen estradiol (ESTRACE) 1 MG tablet Take 1 tablet (1 mg total) by mouth daily. 90 tablet 3  . fluticasone (FLONASE) 50 MCG/ACT nasal spray One spray in each nostril twice a day, use left hand for right nostril, and right hand for left nostril. 48 g 3  . hydrOXYzine (VISTARIL) 50 MG capsule TAKE ONE CAPSULE BY MOUTH EVERY 6 HOURS 90 capsule 3  . ibuprofen (ADVIL,MOTRIN) 800 MG tablet Take 1 tablet (800 mg total) by mouth every 8 (eight) hours as needed. 30 tablet 2  . Linaclotide (LINZESS) 145 MCG CAPS capsule Take 1 capsule (145 mcg total) by mouth daily. 90 capsule 3  . montelukast (SINGULAIR) 10 MG tablet Take 10 mg by mouth at bedtime.    . naltrexone (DEPADE) 50 MG tablet Take by mouth daily.    . phenylephrine (SUDAFED PE) 10 MG TABS tablet Take 1 tablet (10 mg total) by mouth every 8 (eight) hours. 30 tablet 0  . polycarbophil (FIBERCON) 625 MG tablet Take 625 mg by mouth 2 (two) times daily.     . predniSONE (STERAPRED UNI-PAK 48 TAB) 10 MG (48) TBPK tablet 12 day dosepack po 48 tablet 0  . PROCTOZONE-HC 2.5 % rectal cream Place 1 application rectally daily as needed for hemorrhoids or itching.   0  . promethazine (PHENERGAN) 12.5 MG tablet Take 1 tablet (12.5 mg total) by mouth every 4 (four) hours as needed for nausea or vomiting. 30 tablet 0  . rizatriptan (MAXALT) 10 MG tablet Take 10 mg by mouth as needed for migraine. May repeat in 2 hours if needed    . rOPINIRole (REQUIP) 1 MG tablet Take 1 tablet (1 mg total) by mouth at bedtime. 90 tablet 3  . SUMAtriptan 6 MG/0.5ML SOAJ INJECT ONCE AS NEEDED. MAY REPEAT IF NO EFFECT IN 2 HOURS 1 mL 11  . tiZANidine (ZANAFLEX) 4 MG tablet Take 1 tablet by mouth 2 (two) times daily  as needed for muscle spasms.   1  . topiramate (TOPAMAX) 100 MG tablet Take 1 tablet (100 mg total) by mouth daily. 90 tablet 0  . traZODone (DESYREL) 300 MG tablet Take 1 tablet (300 mg total) by mouth at bedtime. 90 tablet 3  . tretinoin (RETIN-A) 0.05 % cream Apply topically at bedtime.    Marland Kitchen venlafaxine (EFFEXOR) 75 MG tablet Take 75 mg by mouth 2 (two) times daily.    Marland Kitchen venlafaxine XR (EFFEXOR-XR) 150 MG 24 hr capsule Use with 75 mg cap (Patient taking differently: Take 150 mg by mouth daily with breakfast. Use with 75 mg cap) 90 capsule 3   No current facility-administered medications for this visit.  Allergies  Allergen Reactions  . Bactrim [Sulfamethoxazole-Trimethoprim] Rash    Rash due to Bactrim  . Cyclobenzaprine Swelling    tongue  . Methocarbamol Swelling    tongue     Exam:  BP 113/56 mmHg  Pulse 67  Temp(Src) 97.7 F (36.5 C) (Oral)  Wt 126 lb (57.153 kg)  SpO2 100%  LMP 06/09/2013 Gen: Well NAD NONTOXIC APPEARING  HEENT: EOMI,  MMM Lungs: Normal work of breathing. CTABL Heart: RRR no MRG Abd: NABS, Soft. Nondistended, Nontender Exts: Brisk capillary refill, warm and well perfused.  Left arm small superficial palpable thrombophlebitis extending a few centimeters from the antecubital fossa. Skin maculopapular blanching erythematous rash over the entire body. No mucocutaneous involvement noticeable.   Patient was given 80 mg of Depo-Medrol IM prior to discharge.  No results found for this or any previous visit (from the past 24 hour(s)). No results found.   Please see individual assessment and plan sections.

## 2015-02-28 NOTE — Assessment & Plan Note (Signed)
Resolving no longer appears to be infected. We'll not treat with a different antibiotic at this time as I do not want to confuse the picture. Follow-up with PCP or myself.

## 2015-03-07 ENCOUNTER — Ambulatory Visit: Payer: Self-pay | Admitting: Sports Medicine

## 2015-03-08 ENCOUNTER — Encounter: Payer: Self-pay | Admitting: Sports Medicine

## 2015-03-09 MED ORDER — MONTELUKAST SODIUM 10 MG PO TABS: 10.0000 mg | ORAL_TABLET | Freq: Every day | ORAL | Status: AC

## 2015-03-09 MED ORDER — GABAPENTIN 800 MG PO TABS: 800.0000 mg | ORAL_TABLET | Freq: Three times a day (TID) | ORAL | Status: DC

## 2015-03-15 ENCOUNTER — Encounter: Payer: BLUE CROSS/BLUE SHIELD | Admitting: Podiatry

## 2015-03-18 ENCOUNTER — Ambulatory Visit: Payer: Self-pay | Admitting: Sports Medicine

## 2015-03-21 ENCOUNTER — Ambulatory Visit (INDEPENDENT_AMBULATORY_CARE_PROVIDER_SITE_OTHER): Payer: BLUE CROSS/BLUE SHIELD | Admitting: Sports Medicine

## 2015-03-21 ENCOUNTER — Encounter: Payer: Self-pay | Admitting: Sports Medicine

## 2015-03-21 ENCOUNTER — Ambulatory Visit (INDEPENDENT_AMBULATORY_CARE_PROVIDER_SITE_OTHER): Payer: BLUE CROSS/BLUE SHIELD

## 2015-03-21 VITALS — BP 119/72 | HR 80 | Temp 97.9°F | Resp 18 | Wt 131.8 lb

## 2015-03-21 DIAGNOSIS — IMO0001 Reserved for inherently not codable concepts without codable children: Secondary | ICD-10-CM

## 2015-03-21 DIAGNOSIS — M5030 Other cervical disc degeneration, unspecified cervical region: Secondary | ICD-10-CM

## 2015-03-21 DIAGNOSIS — Z720 Tobacco use: Secondary | ICD-10-CM | POA: Diagnosis not present

## 2015-03-21 DIAGNOSIS — F17200 Nicotine dependence, unspecified, uncomplicated: Secondary | ICD-10-CM

## 2015-03-21 DIAGNOSIS — M791 Myalgia: Secondary | ICD-10-CM | POA: Diagnosis not present

## 2015-03-21 DIAGNOSIS — M609 Myositis, unspecified: Secondary | ICD-10-CM

## 2015-03-21 DIAGNOSIS — Z23 Encounter for immunization: Secondary | ICD-10-CM

## 2015-03-21 DIAGNOSIS — R05 Cough: Secondary | ICD-10-CM

## 2015-03-21 DIAGNOSIS — F321 Major depressive disorder, single episode, moderate: Secondary | ICD-10-CM

## 2015-03-21 MED ORDER — ALPRAZOLAM 1 MG PO TABS: ORAL_TABLET | ORAL | Status: DC

## 2015-03-21 MED ORDER — ARIPIPRAZOLE 5 MG PO TABS: 5.0000 mg | ORAL_TABLET | Freq: Every day | ORAL | Status: DC

## 2015-03-21 MED ORDER — VORTIOXETINE HBR 10 MG PO TABS: 1.0000 | ORAL_TABLET | Freq: Every day | ORAL | Status: DC

## 2015-03-21 NOTE — Progress Notes (Signed)
  Subjective:    CC: Follow-up  HPI: This 49 year old female returns after a stay in a residential-type drug and opiate rehabilitation facility. She has been feeling significantly depressed, poor sex drive. She is taking 225 mg of Effexor, as well as trazodone at bedtime, with occasional Xanax. No suicidal or homicidal ideation.  Chronic pain: History of cervical degenerative disc disease post ACDF, desires left-sided trigger point injection.  Coughing: Persistent, longtime smoker. No constitutional symptoms. No shortness of breath.  Past medical history, Surgical history, Family history not pertinant except as noted below, Social history, Allergies, and medications have been entered into the medical record, reviewed, and no changes needed.   Review of Systems: No fevers, chills, night sweats, weight loss, chest pain, or shortness of breath.   Objective:    General: Well Developed, well nourished, and in no acute distress.  Neuro: Alert and oriented x3, extra-ocular muscles intact, sensation grossly intact.  HEENT: Normocephalic, atraumatic, pupils equal round reactive to light, neck supple, no masses, no lymphadenopathy, thyroid nonpalpable.  Skin: Warm and dry, no rashes. Cardiac: Regular rate and rhythm, no murmurs rubs or gallops, no lower extremity edema.  Respiratory: Clear to auscultation bilaterally. Not using accessory muscles, speaking in full sentences.  Procedure:  Injection of left paracervical and trapezial trigger points 3 Consent obtained and verified. Time-out conducted. Noted no overlying erythema, induration, or other signs of local infection. Skin prepped in a sterile fashion. Topical analgesic spray: Ethyl chloride. Completed without difficulty. Meds: Total of 1 mL kenalog 40, 1 mL lidocaine spread out between the 3 paracervical trigger points Pain immediately improved suggesting accurate placement of the medication. Advised to call if fevers/chills, erythema,  induration, drainage, or persistent bleeding.  Impression and Recommendations:

## 2015-03-21 NOTE — Assessment & Plan Note (Signed)
Patient is looking into another pain doctor, will continue interventional facet blocks with [facility]. 3 trigger point injections performed today into the left trapezius.

## 2015-03-21 NOTE — Assessment & Plan Note (Signed)
Persistent depression despite 225 mg of Effexor, we are going to add Abilify, unfortunately she is also noted poor sex drive and difficulty achieving orgasm, likely secondary to Effexor. We will down taper Effexor, add Abilify, and switch to Brintellix. Return to see me in one month.

## 2015-03-21 NOTE — Assessment & Plan Note (Signed)
Long history of smoking, rechecking chest x-ray, if clear we will bring her back for lung function testing.

## 2015-03-22 ENCOUNTER — Telehealth: Payer: Self-pay | Admitting: Sports Medicine

## 2015-03-22 NOTE — Telephone Encounter (Signed)
Pt called and stated she was in yesterday and forgot to tell you about her headaches she has been getting about every day and wants to know if there was anything that you can do or if she needs to come in for another appt to discuss this issue.

## 2015-03-22 NOTE — Telephone Encounter (Signed)
That would be another appointment to JUST discuss headaches and nothing else, they are likely coming from her neck spasm, certainly migraines can be a possibility but no way to know without taking a history in the office.

## 2015-03-23 ENCOUNTER — Encounter: Payer: Self-pay | Admitting: Sports Medicine

## 2015-03-24 ENCOUNTER — Encounter: Payer: Self-pay | Admitting: Sports Medicine

## 2015-03-24 ENCOUNTER — Ambulatory Visit: Payer: Self-pay | Admitting: Sports Medicine

## 2015-03-24 DIAGNOSIS — M47812 Spondylosis without myelopathy or radiculopathy, cervical region: Secondary | ICD-10-CM

## 2015-03-25 ENCOUNTER — Ambulatory Visit: Payer: Self-pay | Admitting: Sports Medicine

## 2015-03-28 ENCOUNTER — Encounter: Payer: Self-pay | Admitting: Sports Medicine

## 2015-03-28 ENCOUNTER — Ambulatory Visit (INDEPENDENT_AMBULATORY_CARE_PROVIDER_SITE_OTHER): Payer: BLUE CROSS/BLUE SHIELD | Admitting: Sports Medicine

## 2015-03-28 VITALS — BP 139/83 | HR 79 | Temp 97.9°F | Resp 18 | Wt 132.8 lb

## 2015-03-28 DIAGNOSIS — G43009 Migraine without aura, not intractable, without status migrainosus: Secondary | ICD-10-CM | POA: Diagnosis not present

## 2015-03-28 DIAGNOSIS — M5030 Other cervical disc degeneration, unspecified cervical region: Secondary | ICD-10-CM | POA: Diagnosis not present

## 2015-03-28 DIAGNOSIS — F321 Major depressive disorder, single episode, moderate: Secondary | ICD-10-CM | POA: Diagnosis not present

## 2015-03-28 DIAGNOSIS — G43001 Migraine without aura, not intractable, with status migrainosus: Secondary | ICD-10-CM

## 2015-03-28 MED ORDER — RIZATRIPTAN BENZOATE 10 MG PO TABS: 10.0000 mg | ORAL_TABLET | ORAL | Status: DC | PRN

## 2015-03-28 MED ORDER — ATENOLOL 25 MG PO TABS: 25.0000 mg | ORAL_TABLET | Freq: Every day | ORAL | Status: DC

## 2015-03-28 NOTE — Progress Notes (Signed)
  Subjective:    CC: Follow-up  HPI: Anxiety and depression: Significant better control decreasing Effexor, starting Brintellix, and adding Abilify. No changes.  Cervical spondylosis: Desires to transfer care to Dr. Nelva Bush with [facility] orthopedics.  Has recently had facet injections with Dr. Mina Marble, has not yet had medial branch blocks or radial frequency ablation recently.  Headaches: Bitemporal, moderate, persistent, throbbing, with photophobia, present for hours and with occasional nausea. No trauma. History of migraines, has not yet taken any Maxalt.  Past medical history, Surgical history, Family history not pertinant except as noted below, Social history, Allergies, and medications have been entered into the medical record, reviewed, and no changes needed.   Review of Systems: No fevers, chills, night sweats, weight loss, chest pain, or shortness of breath.   Objective:    General: Well Developed, well nourished, and in no acute distress.  Neuro: Alert and oriented x3, extra-ocular muscles intact, sensation grossly intact. Cranial nerves II through XII are intact, motor, sensory and coordinative functions are all intact. HEENT: Normocephalic, atraumatic, pupils equal round reactive to light, neck supple, no masses, no lymphadenopathy, thyroid nonpalpable.  Skin: Warm and dry, no rashes. Cardiac: Regular rate and rhythm, no murmurs rubs or gallops, no lower extremity edema.  Respiratory: Clear to auscultation bilaterally. Not using accessory muscles, speaking in full sentences.  Impression and Recommendations:    I spent 25 minutes with this patient, greater than 50% was face-to-face time counseling regarding the above diagnoses

## 2015-03-28 NOTE — Assessment & Plan Note (Signed)
Fantastic improvement with coming off of Effexor, and starting Brintellix and Abilify. Has not yet had a chance to test for persistent sexual dysfunction.

## 2015-03-28 NOTE — Assessment & Plan Note (Signed)
Continue Topamax, increasing to 150 mg. She will use Maxalt for breakthrough headaches, and I am going to add atenolol for further prevention.

## 2015-03-28 NOTE — Assessment & Plan Note (Signed)
Currently getting facet injections at [place] with [facility] orthoped[facility].

## 2015-03-31 NOTE — Addendum Note (Signed)
Addended by: Silverio Decamp on: 03/31/2015 09:18 AM   Modules accepted: Orders

## 2015-04-02 ENCOUNTER — Encounter: Payer: Self-pay | Admitting: Sports Medicine

## 2015-04-05 ENCOUNTER — Encounter: Payer: Self-pay | Admitting: Sports Medicine

## 2015-04-07 ENCOUNTER — Ambulatory Visit: Payer: Self-pay | Admitting: Sports Medicine

## 2015-04-07 ENCOUNTER — Encounter: Payer: Self-pay | Admitting: Sports Medicine

## 2015-04-07 ENCOUNTER — Ambulatory Visit (INDEPENDENT_AMBULATORY_CARE_PROVIDER_SITE_OTHER): Payer: BLUE CROSS/BLUE SHIELD | Admitting: Sports Medicine

## 2015-04-07 ENCOUNTER — Other Ambulatory Visit: Payer: Self-pay | Admitting: Sports Medicine

## 2015-04-07 VITALS — BP 134/77 | HR 60 | Temp 97.7°F | Resp 16 | Wt 131.1 lb

## 2015-04-07 DIAGNOSIS — M609 Myositis, unspecified: Secondary | ICD-10-CM

## 2015-04-07 DIAGNOSIS — M791 Myalgia: Secondary | ICD-10-CM | POA: Diagnosis not present

## 2015-04-07 DIAGNOSIS — M5030 Other cervical disc degeneration, unspecified cervical region: Secondary | ICD-10-CM

## 2015-04-07 DIAGNOSIS — F321 Major depressive disorder, single episode, moderate: Secondary | ICD-10-CM | POA: Diagnosis not present

## 2015-04-07 DIAGNOSIS — IMO0001 Reserved for inherently not codable concepts without codable children: Secondary | ICD-10-CM

## 2015-04-07 MED ORDER — ARIPIPRAZOLE 10 MG PO TABS: 10.0000 mg | ORAL_TABLET | Freq: Every day | ORAL | Status: DC

## 2015-04-07 MED ORDER — VORTIOXETINE HBR 20 MG PO TABS: 20.0000 mg | ORAL_TABLET | Freq: Every day | ORAL | Status: DC

## 2015-04-07 NOTE — Progress Notes (Signed)
  Subjective:    CC: Neck pain  HPI: This is a pleasant 49 year old female, she has cervical spondylosis and is post-ACDF, she desires trigger point injections today, she has persistent left-sided cervical spasm, she did have a good response to cervical facet injections but it sounds as though was having difficulty getting her medial branch blocks and radial frequency ablation covered. She stands that she needs to continue with her pain clinic regarding this.  Anxiety and depression: Fantastic improvement since coming off Effexor, she still has some irritability and depressed mood on 10 mg of Brintellix and 5 mg of Abilify and is agreeable to go up on the doses. No suicidal or homicidal ideation.  Past medical history, Surgical history, Family history not pertinant except as noted below, Social history, Allergies, and medications have been entered into the medical record, reviewed, and no changes needed.   Review of Systems: No fevers, chills, night sweats, weight loss, chest pain, or shortness of breath.   Objective:    General: Well Developed, well nourished, and in no acute distress.  Neuro: Alert and oriented x3, extra-ocular muscles intact, sensation grossly intact.  HEENT: Normocephalic, atraumatic, pupils equal round reactive to light, neck supple, no masses, no lymphadenopathy, thyroid nonpalpable.  Skin: Warm and dry, no rashes. Cardiac: Regular rate and rhythm, no murmurs rubs or gallops, no lower extremity edema.  Respiratory: Clear to auscultation bilaterally. Not using accessory muscles, speaking in full sentences. Neck: Negative spurling's Full neck range of motion Grip strength and sensation normal in bilateral hands Strength good C4 to T1 distribution No sensory change to C4 to T1 Reflexes normal Tender in the left trapezius  Procedure:  Injection of 3 left paracervical trigger points Consent obtained and verified. Time-out conducted. Noted no overlying erythema,  induration, or other signs of local infection. Skin prepped in a sterile fashion. Topical analgesic spray: Ethyl chloride. Completed without difficulty. Meds: A total of 1 mL kenalog 40, 2 mL lidocaine, 2 mL Marcaine was spread out between the painful trigger points. Pain immediately improved suggesting accurate placement of the medication. Advised to call if fevers/chills, erythema, induration, drainage, or persistent bleeding.  Impression and Recommendations:    I spent 25 minutes with this patient, greater than 50% was face-to-face time counseling regarding the above diagnoses

## 2015-04-07 NOTE — Assessment & Plan Note (Signed)
Continue Abilify and Brintellix however we are going to increase to 20 mg Brintellix and 10 mg Abilify.

## 2015-04-07 NOTE — Assessment & Plan Note (Signed)
Getting facet injections with [facility] orthopedics, trigger point injections performed today

## 2015-04-12 ENCOUNTER — Encounter: Payer: Self-pay | Admitting: Sports Medicine

## 2015-04-12 ENCOUNTER — Other Ambulatory Visit: Payer: Self-pay | Admitting: *Deleted

## 2015-04-12 DIAGNOSIS — M47812 Spondylosis without myelopathy or radiculopathy, cervical region: Secondary | ICD-10-CM

## 2015-04-13 ENCOUNTER — Telehealth: Payer: Self-pay | Admitting: Sports Medicine

## 2015-04-13 NOTE — Telephone Encounter (Signed)
Dr. Darene Lamer   [facility] called and stated Dr. Ace Gins was declining patient due to the amount of opioids she was taking. Are we going to send referral elsewhere or just close it. - CF

## 2015-04-14 ENCOUNTER — Encounter: Payer: Self-pay | Admitting: Sports Medicine

## 2015-04-14 NOTE — Telephone Encounter (Signed)
Just close it, she needs to do the leg work herself from now on.

## 2015-04-14 NOTE — Telephone Encounter (Signed)
See message from mychart   I have an appointment with [facility] on Tuesday January 10th at 8:30. Just wanted to let you know. Have a great day and be safe in this weather we are supposed to get.    Thank you,

## 2015-04-18 ENCOUNTER — Ambulatory Visit: Payer: Self-pay | Admitting: Sports Medicine

## 2015-04-19 ENCOUNTER — Other Ambulatory Visit: Payer: Self-pay | Admitting: Sports Medicine

## 2015-04-19 ENCOUNTER — Encounter: Payer: Self-pay | Admitting: Sports Medicine

## 2015-04-19 DIAGNOSIS — F321 Major depressive disorder, single episode, moderate: Secondary | ICD-10-CM

## 2015-04-19 DIAGNOSIS — G43009 Migraine without aura, not intractable, without status migrainosus: Secondary | ICD-10-CM

## 2015-04-19 MED ORDER — ALPRAZOLAM 1 MG PO TABS: ORAL_TABLET | ORAL | Status: DC

## 2015-04-19 MED ORDER — TOPIRAMATE 100 MG PO TABS: 150.0000 mg | ORAL_TABLET | Freq: Every day | ORAL | Status: DC

## 2015-04-20 ENCOUNTER — Other Ambulatory Visit: Payer: Self-pay | Admitting: Sports Medicine

## 2015-04-22 ENCOUNTER — Other Ambulatory Visit: Payer: Self-pay | Admitting: Sports Medicine

## 2015-04-22 ENCOUNTER — Ambulatory Visit (INDEPENDENT_AMBULATORY_CARE_PROVIDER_SITE_OTHER): Payer: BLUE CROSS/BLUE SHIELD | Admitting: Sports Medicine

## 2015-04-22 VITALS — BP 117/75 | HR 58 | Temp 98.0°F | Resp 18 | Wt 134.3 lb

## 2015-04-22 DIAGNOSIS — J321 Chronic frontal sinusitis: Secondary | ICD-10-CM | POA: Diagnosis not present

## 2015-04-22 DIAGNOSIS — F321 Major depressive disorder, single episode, moderate: Secondary | ICD-10-CM

## 2015-04-22 DIAGNOSIS — G43009 Migraine without aura, not intractable, without status migrainosus: Secondary | ICD-10-CM | POA: Diagnosis not present

## 2015-04-22 MED ORDER — TOPIRAMATE 200 MG PO TABS: 200.0000 mg | ORAL_TABLET | Freq: Every day | ORAL | Status: AC

## 2015-04-22 MED ORDER — MAGNESIUM OXIDE 400 MG PO TABS: 800.0000 mg | ORAL_TABLET | Freq: Every day | ORAL | Status: DC

## 2015-04-22 NOTE — Assessment & Plan Note (Signed)
Chronic sinusitis post what sounds to be a balloon sinuplasty, referral to a new ENT.

## 2015-04-22 NOTE — Assessment & Plan Note (Signed)
Continue Brintellix 20, Abilify 10. Increasing Topamax.

## 2015-04-22 NOTE — Progress Notes (Signed)
  Subjective:    CC: Follow-up  HPI: Depression: Improved significantly but does note continued mood swings despite Topamax 150, Brintellix 20 and Abilify 10. No suicidal or homicidal ideation.  Also notes significant rhinitis and stuffy nose. Previous ENT is no longer in practice, would like a new referral.  Past medical history, Surgical history, Family history not pertinant except as noted below, Social history, Allergies, and medications have been entered into the medical record, reviewed, and no changes needed.   Review of Systems: No fevers, chills, night sweats, weight loss, chest pain, or shortness of breath.   Objective:    General: Well Developed, well nourished, and in no acute distress.  Neuro: Alert and oriented x3, extra-ocular muscles intact, sensation grossly intact.  HEENT: Normocephalic, atraumatic, pupils equal round reactive to light, neck supple, no masses, no lymphadenopathy, thyroid nonpalpable.  Skin: Warm and dry, no rashes. Cardiac: Regular rate and rhythm, no murmurs rubs or gallops, no lower extremity edema.  Respiratory: Clear to auscultation bilaterally. Not using accessory muscles, speaking in full sentences.  Impression and Recommendations:   I spent 25 minutes with this patient, greater than 50% was face-to-face time counseling regarding the above diagnoses

## 2015-04-28 ENCOUNTER — Ambulatory Visit: Payer: Self-pay | Admitting: Sports Medicine

## 2015-05-02 ENCOUNTER — Ambulatory Visit: Payer: Self-pay | Admitting: Cardiovascular Disease

## 2015-05-05 ENCOUNTER — Encounter: Payer: Self-pay | Admitting: Sports Medicine

## 2015-05-13 ENCOUNTER — Telehealth: Payer: Self-pay

## 2015-05-13 ENCOUNTER — Encounter: Payer: Self-pay | Admitting: Sports Medicine

## 2015-05-13 DIAGNOSIS — F321 Major depressive disorder, single episode, moderate: Secondary | ICD-10-CM

## 2015-05-13 MED ORDER — VILAZODONE HCL 10 & 20 MG PO KIT: 1.0000 | PACK | Freq: Every day | ORAL | Status: DC

## 2015-05-13 MED ORDER — DULOXETINE HCL 30 MG PO CPEP: 30.0000 mg | ORAL_CAPSULE | Freq: Every day | ORAL | Status: DC

## 2015-05-13 NOTE — Telephone Encounter (Signed)
Switching to Viibryd.  Patient needs to come get discount coupon.

## 2015-05-13 NOTE — Telephone Encounter (Signed)
Patient advised.

## 2015-05-13 NOTE — Telephone Encounter (Signed)
Good choice, Cymbalta 30 added, give at least 4-6 weeks for full response.

## 2015-05-13 NOTE — Telephone Encounter (Signed)
Patient called and reports the Trintellix co-pay is 80 dollars a month. She can not afford this price and would like a different medication sent in. She has no Trintellix left. She took the last one today. Please advise.

## 2015-05-13 NOTE — Telephone Encounter (Signed)
Ultimately we may need to go back to one of the standard SSRIs.  please let me know which ones she has tried already, Lexapro? Zoloft? Prozac? Celexa?

## 2015-05-13 NOTE — Telephone Encounter (Signed)
She states she wants to try Cymbalta.

## 2015-05-13 NOTE — Telephone Encounter (Signed)
Patient states the cost of Viibryd is 250 a month.

## 2015-05-16 ENCOUNTER — Encounter: Payer: Self-pay | Admitting: Sports Medicine

## 2015-05-19 ENCOUNTER — Other Ambulatory Visit: Payer: Self-pay | Admitting: Sports Medicine

## 2015-05-19 ENCOUNTER — Encounter: Payer: Self-pay | Admitting: Sports Medicine

## 2015-05-19 DIAGNOSIS — F321 Major depressive disorder, single episode, moderate: Secondary | ICD-10-CM

## 2015-05-20 ENCOUNTER — Encounter: Payer: Self-pay | Admitting: Sports Medicine

## 2015-05-20 DIAGNOSIS — F321 Major depressive disorder, single episode, moderate: Secondary | ICD-10-CM

## 2015-05-20 MED ORDER — ALPRAZOLAM 1 MG PO TABS: ORAL_TABLET | ORAL | Status: DC

## 2015-05-23 ENCOUNTER — Ambulatory Visit: Payer: Self-pay | Admitting: Sports Medicine

## 2015-05-26 ENCOUNTER — Ambulatory Visit: Payer: Self-pay | Admitting: Sports Medicine

## 2015-05-30 ENCOUNTER — Ambulatory Visit (INDEPENDENT_AMBULATORY_CARE_PROVIDER_SITE_OTHER): Payer: BLUE CROSS/BLUE SHIELD

## 2015-05-30 ENCOUNTER — Ambulatory Visit (INDEPENDENT_AMBULATORY_CARE_PROVIDER_SITE_OTHER): Payer: BLUE CROSS/BLUE SHIELD | Admitting: Sports Medicine

## 2015-05-30 ENCOUNTER — Encounter: Payer: Self-pay | Admitting: Sports Medicine

## 2015-05-30 VITALS — BP 107/72 | HR 70 | Resp 16 | Wt 145.0 lb

## 2015-05-30 DIAGNOSIS — R05 Cough: Secondary | ICD-10-CM | POA: Diagnosis not present

## 2015-05-30 DIAGNOSIS — R059 Cough, unspecified: Secondary | ICD-10-CM | POA: Insufficient documentation

## 2015-05-30 DIAGNOSIS — F321 Major depressive disorder, single episode, moderate: Secondary | ICD-10-CM | POA: Diagnosis not present

## 2015-05-30 MED ORDER — BENZONATATE 200 MG PO CAPS: 200.0000 mg | ORAL_CAPSULE | Freq: Three times a day (TID) | ORAL | Status: DC | PRN

## 2015-05-30 NOTE — Assessment & Plan Note (Signed)
Likely a viral respiratory infection, adding Tessalon Perles, chest x-ray.

## 2015-05-30 NOTE — Assessment & Plan Note (Signed)
Off of Abilify and Brintellix, currently on Klonopin with psychiatrist, discontinue alprazolam, further psychiatric medications will be managed by psychiatry.

## 2015-05-30 NOTE — Progress Notes (Addendum)
  Subjective:    CC: Feeling sick  HPI: For the past couple days this 50 year old female has had a mild cough, minimally productive. No constitutional symptoms, shortness of breath, or GI symptoms. Symptoms are mild, persistent. Has since stopped smoking.  Depression: Her psychiatrist has switched her from Brintellix and Abilify to Cymbalta, also switched from alprazolam to Klonopin, patient understands that her psychiatrist will need to do further refills on all of her psychotropic agents. She also reports significant weight gain, this is likely from the Abilify, we will keep an eye on this over the next few months  Past medical history, Surgical history, Family history not pertinant except as noted below, Social history, Allergies, and medications have been entered into the medical record, reviewed, and no changes needed.   Review of Systems: No fevers, chills, night sweats, weight loss, chest pain, or shortness of breath.   Objective:    General: Well Developed, well nourished, and in no acute distress.  Neuro: Alert and oriented x3, extra-ocular muscles intact, sensation grossly intact.  HEENT: Normocephalic, atraumatic, pupils equal round reactive to light, neck supple, no masses, no lymphadenopathy, thyroid nonpalpable. Oropharynx, nasopharynx, ear canals unremarkable with the exception of a slight abrasion in the left external ear canal, patient admits to picking at this with further questioning. Skin: Warm and dry, no rashes. Cardiac: Regular rate and rhythm, no murmurs rubs or gallops, no lower extremity edema.  Respiratory: Clear to auscultation bilaterally. Not using accessory muscles, speaking in full sentences.  Impression and Recommendations:

## 2015-06-02 ENCOUNTER — Telehealth: Payer: Self-pay | Admitting: Sports Medicine

## 2015-06-02 ENCOUNTER — Encounter: Payer: Self-pay | Admitting: Sports Medicine

## 2015-06-02 MED ORDER — DEXTROMETHORPHAN POLISTIREX ER 30 MG/5ML PO SUER: 30.0000 mg | Freq: Two times a day (BID) | ORAL | Status: DC

## 2015-06-02 NOTE — Telephone Encounter (Signed)
Patient states that cough med is not working and would like to try something else

## 2015-06-02 NOTE — Telephone Encounter (Signed)
She was here 3 days ago, she has a cold, cough can persist for 2-4 weeks, if Tessalon Perles are not working I am going to add Delsym however we will not use any narcotics.

## 2015-06-03 ENCOUNTER — Other Ambulatory Visit: Payer: Self-pay | Admitting: Podiatry

## 2015-06-27 ENCOUNTER — Ambulatory Visit: Payer: Self-pay | Admitting: Sports Medicine

## 2015-06-30 ENCOUNTER — Ambulatory Visit (INDEPENDENT_AMBULATORY_CARE_PROVIDER_SITE_OTHER): Payer: BLUE CROSS/BLUE SHIELD | Admitting: Sports Medicine

## 2015-06-30 ENCOUNTER — Encounter: Payer: Self-pay | Admitting: Sports Medicine

## 2015-06-30 VITALS — BP 113/70 | HR 76 | Resp 16 | Wt 145.0 lb

## 2015-06-30 DIAGNOSIS — R05 Cough: Secondary | ICD-10-CM | POA: Diagnosis not present

## 2015-06-30 DIAGNOSIS — F321 Major depressive disorder, single episode, moderate: Secondary | ICD-10-CM

## 2015-06-30 DIAGNOSIS — R059 Cough, unspecified: Secondary | ICD-10-CM

## 2015-06-30 NOTE — Assessment & Plan Note (Signed)
Currently back on Effexor, continues with trazodone and hydroxyzine. Further management per psychiatry.

## 2015-06-30 NOTE — Assessment & Plan Note (Signed)
Improved significantly with Singulair.

## 2015-06-30 NOTE — Progress Notes (Signed)
  Subjective:    CC: follow-up  HPI: Depression: Managed by psychiatry, back on Effexor and doing well.  Coughing: Improved with Singulair.  Past medical history, Surgical history, Family history not pertinant except as noted below, Social history, Allergies, and medications have been entered into the medical record, reviewed, and no changes needed.   Review of Systems: No fevers, chills, night sweats, weight loss, chest pain, or shortness of breath.   Objective:    General: Well Developed, well nourished, and in no acute distress.  Neuro: Alert and oriented x3, extra-ocular muscles intact, sensation grossly intact.  HEENT: Normocephalic, atraumatic, pupils equal round reactive to light, neck supple, no masses, no lymphadenopathy, thyroid nonpalpable.  Skin: Warm and dry, no rashes. Cardiac: Regular rate and rhythm, no murmurs rubs or gallops, no lower extremity edema.  Respiratory: Clear to auscultation bilaterally. Not using accessory muscles, speaking in full sentences.  Impression and Recommendations:

## 2015-07-04 ENCOUNTER — Telehealth: Payer: Self-pay

## 2015-07-04 NOTE — Telephone Encounter (Signed)
Pt left VM stating that she was recently placed on antibiotic by an outside provider that gave her diarrhea. Called provider office and told to d/c antibiotic would like to know if you will give her something for the diarrhea. Has already been using over the counter immodium. Please advise.

## 2015-07-05 ENCOUNTER — Ambulatory Visit (INDEPENDENT_AMBULATORY_CARE_PROVIDER_SITE_OTHER): Payer: BLUE CROSS/BLUE SHIELD

## 2015-07-05 ENCOUNTER — Ambulatory Visit (INDEPENDENT_AMBULATORY_CARE_PROVIDER_SITE_OTHER): Payer: BLUE CROSS/BLUE SHIELD | Admitting: Sports Medicine

## 2015-07-05 ENCOUNTER — Encounter: Payer: Self-pay | Admitting: Sports Medicine

## 2015-07-05 VITALS — BP 139/77 | HR 62 | Resp 18 | Wt 142.2 lb

## 2015-07-05 DIAGNOSIS — F321 Major depressive disorder, single episode, moderate: Secondary | ICD-10-CM

## 2015-07-05 DIAGNOSIS — M25512 Pain in left shoulder: Secondary | ICD-10-CM

## 2015-07-05 DIAGNOSIS — R197 Diarrhea, unspecified: Secondary | ICD-10-CM | POA: Diagnosis not present

## 2015-07-05 NOTE — Progress Notes (Signed)
  Subjective:    CC: anxiety and shoulder pain  HPI: Anxiety: Man's psychiatric issues are managed by her psychiatrist, she is currently on Effexor 150, trazodone, and hydroxyzine as well as an occasional gabapentin. She ended up with a positive Xanax on her urine drug screen, but is only prescribed Klonopin by her psychiatrist, so they discontinued all benzodiazepines appropriately.  She is asking me if I will prescribe her benzos.  No suicidal or homicidal ideation.  Left shoulder pain: Localized of the deltoid, worse overhead activities and reaching across her chest, no trauma, pain is moderate, persistent without radiation.  Past medical history, Surgical history, Family history not pertinant except as noted below, Social history, Allergies, and medications have been entered into the medical record, reviewed, and no changes needed.   Review of Systems: No fevers, chills, night sweats, weight loss, chest pain, or shortness of breath.   Objective:    General: Well Developed, well nourished, and in no acute distress.  Neuro: Alert and oriented x3, extra-ocular muscles intact, sensation grossly intact.  HEENT: Normocephalic, atraumatic, pupils equal round reactive to light, neck supple, no masses, no lymphadenopathy, thyroid nonpalpable.  Skin: Warm and dry, no rashes. Cardiac: Regular rate and rhythm, no murmurs rubs or gallops, no lower extremity edema.  Respiratory: Clear to auscultation bilaterally. Not using accessory muscles, speaking in full sentences. Left Shoulder: Inspection reveals no abnormalities, atrophy or asymmetry. Palpation is normal with no tenderness over AC joint or bicipital groove. ROM is full in all planes. Rotator cuff strength normal throughout. positive Neer and Hawkin's tests, empty can. Speeds and Yergason's tests normal. No labral pathology noted with negative Obrien's, negative crank, negative clunk, and good stability. Normal scapular function  observed. No painful arc and no drop arm sign. No apprehension sign  Impression and Recommendations:

## 2015-07-05 NOTE — Assessment & Plan Note (Signed)
Most likely related to narcotics and mixed opiate agonist/antagonist. She did have a course of antibiotics so we are going to check stool C. Difficile. Continue Imodium for now.

## 2015-07-05 NOTE — Assessment & Plan Note (Signed)
Suspect impingement syndrome.  X-rays, MRI. I suspect she will need an injection at the next visit.

## 2015-07-05 NOTE — Assessment & Plan Note (Signed)
Abnormal drug test so benzos discontinued by her psychiatrist. I did recommend that she consider increasing the Effexor to 225 mg but she needs to discuss this and confirm this with psychiatry and get refills through psychiatry.

## 2015-07-11 ENCOUNTER — Other Ambulatory Visit: Payer: Self-pay

## 2015-07-12 ENCOUNTER — Other Ambulatory Visit: Payer: Self-pay | Admitting: Sports Medicine

## 2015-07-18 ENCOUNTER — Other Ambulatory Visit: Payer: BLUE CROSS/BLUE SHIELD

## 2015-07-19 ENCOUNTER — Other Ambulatory Visit: Payer: Self-pay | Admitting: Sports Medicine

## 2015-07-25 ENCOUNTER — Other Ambulatory Visit: Payer: BLUE CROSS/BLUE SHIELD

## 2015-07-28 ENCOUNTER — Ambulatory Visit: Payer: Self-pay | Admitting: Sports Medicine

## 2015-08-15 ENCOUNTER — Ambulatory Visit (INDEPENDENT_AMBULATORY_CARE_PROVIDER_SITE_OTHER): Payer: BLUE CROSS/BLUE SHIELD

## 2015-08-15 DIAGNOSIS — M25512 Pain in left shoulder: Secondary | ICD-10-CM

## 2015-08-15 DIAGNOSIS — M75102 Unspecified rotator cuff tear or rupture of left shoulder, not specified as traumatic: Secondary | ICD-10-CM | POA: Diagnosis not present

## 2015-08-15 DIAGNOSIS — M70812 Other soft tissue disorders related to use, overuse and pressure, left shoulder: Secondary | ICD-10-CM

## 2015-08-16 ENCOUNTER — Ambulatory Visit: Payer: Self-pay | Admitting: Sports Medicine

## 2015-08-16 ENCOUNTER — Ambulatory Visit (INDEPENDENT_AMBULATORY_CARE_PROVIDER_SITE_OTHER): Payer: BLUE CROSS/BLUE SHIELD | Admitting: Sports Medicine

## 2015-08-16 VITALS — BP 112/75 | HR 54 | Resp 16 | Wt 140.5 lb

## 2015-08-16 DIAGNOSIS — F321 Major depressive disorder, single episode, moderate: Secondary | ICD-10-CM

## 2015-08-16 DIAGNOSIS — M25512 Pain in left shoulder: Secondary | ICD-10-CM | POA: Diagnosis not present

## 2015-08-16 MED ORDER — BUSPIRONE HCL 5 MG PO TABS: 5.0000 mg | ORAL_TABLET | Freq: Three times a day (TID) | ORAL | Status: DC

## 2015-08-16 MED ORDER — SUMATRIPTAN SUCCINATE 50 MG PO TABS: 50.0000 mg | ORAL_TABLET | ORAL | Status: DC | PRN

## 2015-08-16 NOTE — Assessment & Plan Note (Signed)
Continue current medications, adding BuSpar, patient understands that we will not be using benzos or other controlled substances. GAD7 each visit. Referral downstairs for behavioral therapy and psychiatry

## 2015-08-16 NOTE — Assessment & Plan Note (Signed)
Subacromial bursitis and some slight tearing of the rotator cuff, injection as above, return in one month.

## 2015-08-16 NOTE — Progress Notes (Signed)
  Subjective:    CC: Follow-up  HPI: This is a pleasant 50 year old female who returns for her shoulder pain, ultimately we obtained an MRI that showed subacromial bursitis and some rotator cuff tearing. Pain is moderate, persistent and she is agreeable to proceed with injection.  Past medical history, Surgical history, Family history not pertinant except as noted below, Social history, Allergies, and medications have been entered into the medical record, reviewed, and no changes needed.   Review of Systems: No fevers, chills, night sweats, weight loss, chest pain, or shortness of breath.   Objective:    General: Well Developed, well nourished, and in no acute distress.  Neuro: Alert and oriented x3, extra-ocular muscles intact, sensation grossly intact.  HEENT: Normocephalic, atraumatic, pupils equal round reactive to light, neck supple, no masses, no lymphadenopathy, thyroid nonpalpable.  Skin: Warm and dry, no rashes. Cardiac: Regular rate and rhythm, no murmurs rubs or gallops, no lower extremity edema.  Respiratory: Clear to auscultation bilaterally. Not using accessory muscles, speaking in full sentences.  Procedure: Real-time Ultrasound Guided Injection of left subacromial bursa Device: GE Logiq E  Verbal informed consent obtained.  Time-out conducted.  Noted no overlying erythema, induration, or other signs of local infection.  Skin prepped in a sterile fashion.  Local anesthesia: Topical Ethyl chloride.  With sterile technique and under real time ultrasound guidance:  25-gauge needle advanced under the acromion, taking care to avoid injection in the supraspinatus I injected 1 mL kenalog 40, 1 mL lidocaine, 1 mL Marcaine. Completed without difficulty  Pain immediately resolved suggesting accurate placement of the medication.  Advised to call if fevers/chills, erythema, induration, drainage, or persistent bleeding.  Images permanently stored and available for review in the  ultrasound unit.  Impression: Technically successful ultrasound guided injection.  Impression and Recommendations:

## 2015-08-16 NOTE — Addendum Note (Signed)
Addended by: Silverio Decamp on: 08/16/2015 01:45 PM   Modules accepted: Orders, Medications

## 2015-09-07 ENCOUNTER — Other Ambulatory Visit: Payer: Self-pay | Admitting: Sports Medicine

## 2015-09-08 ENCOUNTER — Ambulatory Visit (INDEPENDENT_AMBULATORY_CARE_PROVIDER_SITE_OTHER): Payer: BLUE CROSS/BLUE SHIELD

## 2015-09-08 ENCOUNTER — Ambulatory Visit (INDEPENDENT_AMBULATORY_CARE_PROVIDER_SITE_OTHER): Payer: BLUE CROSS/BLUE SHIELD | Admitting: Sports Medicine

## 2015-09-08 ENCOUNTER — Encounter: Payer: Self-pay | Admitting: Sports Medicine

## 2015-09-08 VITALS — BP 112/66 | HR 67 | Resp 18 | Wt 133.4 lb

## 2015-09-08 DIAGNOSIS — M25512 Pain in left shoulder: Secondary | ICD-10-CM

## 2015-09-08 DIAGNOSIS — M545 Low back pain, unspecified: Secondary | ICD-10-CM

## 2015-09-08 MED ORDER — LIDOCAINE 5 % EX PTCH: 1.0000 | MEDICATED_PATCH | Freq: Two times a day (BID) | CUTANEOUS | Status: DC

## 2015-09-08 MED ORDER — GABAPENTIN 300 MG PO CAPS: ORAL_CAPSULE | ORAL | Status: DC

## 2015-09-08 NOTE — Assessment & Plan Note (Signed)
Meloxicam, Lidoderm, off of all controlled substances. Aggressive formal physical therapy, x-rays. Return in one month.

## 2015-09-08 NOTE — Assessment & Plan Note (Signed)
Persistent pain, patient requesting additional injection, this is declined. She will need arthroscopy. We will discuss this at a future visit.

## 2015-09-08 NOTE — Progress Notes (Signed)
  Subjective:    CC: Back pain  HPI: This is a 50 year old female with chronic pain syndrome, previously addicted to narcotics, she comes in with a new complaint of left-sided low back pain, no bowel or bladder dysfunction, saddle numbness, no trauma, no constitutional symptoms, worse with sitting, flexion, Valsalva, nothing radicular. Pain is severe, persistent. She is off of her Suboxone.  Past medical history, Surgical history, Family history not pertinant except as noted below, Social history, Allergies, and medications have been entered into the medical record, reviewed, and no changes needed.   Review of Systems: No fevers, chills, night sweats, weight loss, chest pain, or shortness of breath.   Objective:    General: Well Developed, well nourished, and in no acute distress.  Neuro: Alert and oriented x3, extra-ocular muscles intact, sensation grossly intact.  HEENT: Normocephalic, atraumatic, pupils equal round reactive to light, neck supple, no masses, no lymphadenopathy, thyroid nonpalpable.  Skin: Warm and dry, no rashes. Cardiac: Regular rate and rhythm, no murmurs rubs or gallops, no lower extremity edema.  Respiratory: Clear to auscultation bilaterally. Not using accessory muscles, speaking in full sentences. Back Exam:  Inspection: Unremarkable  Motion: Flexion 45 deg, Extension 45 deg, Side Bending to 45 deg bilaterally,  Rotation to 45 deg bilaterally  SLR laying: Negative  XSLR laying: Negative  Palpable tenderness: Left paralumbar muscles. FABER: negative. Sensory change: Gross sensation intact to all lumbar and sacral dermatomes.  Reflexes: 2+ at both patellar tendons, 2+ at achilles tendons, Babinski's downgoing.  Strength at foot  Plantar-flexion: 5/5 Dorsi-flexion: 5/5 Eversion: 5/5 Inversion: 5/5  Leg strength  Quad: 5/5 Hamstring: 5/5 Hip flexor: 5/5 Hip abductors: 5/5  Gait unremarkable.  Impression and Recommendations:   I spent 25 minutes with this  patient, greater than 50% was face-to-face time counseling regarding the above diagnoses

## 2015-09-09 ENCOUNTER — Encounter: Payer: Self-pay | Admitting: Sports Medicine

## 2015-09-09 MED ORDER — MELOXICAM 15 MG PO TABS: ORAL_TABLET | ORAL | Status: DC

## 2015-09-12 ENCOUNTER — Other Ambulatory Visit: Payer: Self-pay

## 2015-09-12 DIAGNOSIS — F17200 Nicotine dependence, unspecified, uncomplicated: Secondary | ICD-10-CM

## 2015-09-12 MED ORDER — NICOTINE 21 MG/24HR TD PT24: MEDICATED_PATCH | TRANSDERMAL | Status: DC

## 2015-09-13 ENCOUNTER — Ambulatory Visit: Payer: Self-pay | Admitting: Sports Medicine

## 2015-09-15 ENCOUNTER — Encounter: Payer: Self-pay | Admitting: Sports Medicine

## 2015-09-30 ENCOUNTER — Other Ambulatory Visit: Payer: Self-pay | Admitting: Sports Medicine

## 2015-10-04 ENCOUNTER — Other Ambulatory Visit: Payer: Self-pay | Admitting: Sports Medicine

## 2015-10-05 ENCOUNTER — Other Ambulatory Visit: Payer: Self-pay | Admitting: Orthopedic Surgery

## 2015-10-05 DIAGNOSIS — M542 Cervicalgia: Secondary | ICD-10-CM

## 2015-10-06 ENCOUNTER — Ambulatory Visit: Payer: Self-pay | Admitting: Sports Medicine

## 2015-10-07 ENCOUNTER — Other Ambulatory Visit: Payer: Self-pay | Admitting: Orthopedic Surgery

## 2015-10-07 DIAGNOSIS — M25512 Pain in left shoulder: Secondary | ICD-10-CM

## 2015-10-14 ENCOUNTER — Ambulatory Visit: Payer: BLUE CROSS/BLUE SHIELD | Admitting: Neurology

## 2015-10-18 ENCOUNTER — Ambulatory Visit
Admission: RE | Admit: 2015-10-18 | Discharge: 2015-10-18 | Disposition: A | Discharge status: Home / Self Care | Payer: BLUE CROSS/BLUE SHIELD | Source: Ambulatory Visit | Attending: Orthopedic Surgery | Admitting: Orthopedic Surgery

## 2015-10-18 DIAGNOSIS — M25512 Pain in left shoulder: Secondary | ICD-10-CM

## 2015-10-18 DIAGNOSIS — M542 Cervicalgia: Secondary | ICD-10-CM

## 2015-10-19 ENCOUNTER — Encounter (HOSPITAL_COMMUNITY)
Admission: RE | Admit: 2015-10-19 | Discharge: 2015-10-19 | Disposition: A | Discharge status: Home / Self Care | Payer: BLUE CROSS/BLUE SHIELD | Source: Ambulatory Visit | Attending: Orthopedic Surgery | Admitting: Orthopedic Surgery

## 2015-10-19 ENCOUNTER — Encounter (HOSPITAL_COMMUNITY): Payer: Self-pay

## 2015-10-19 ENCOUNTER — Other Ambulatory Visit (HOSPITAL_COMMUNITY): Payer: Self-pay

## 2015-10-19 DIAGNOSIS — Z01812 Encounter for preprocedural laboratory examination: Secondary | ICD-10-CM | POA: Diagnosis present

## 2015-10-19 HISTORY — DX: Panic disorder (episodic paroxysmal anxiety): F41.0

## 2015-10-19 LAB — COMPREHENSIVE METABOLIC PANEL
ALT: 11 U/L — AB (ref 14–54)
AST: 17 U/L (ref 15–41)
Albumin: 3.9 g/dL (ref 3.5–5.0)
Alkaline Phosphatase: 47 U/L (ref 38–126)
Anion gap: 5 (ref 5–15)
BILIRUBIN TOTAL: 0.3 mg/dL (ref 0.3–1.2)
BUN: 13 mg/dL (ref 6–20)
CALCIUM: 8.3 mg/dL — AB (ref 8.9–10.3)
CHLORIDE: 108 mmol/L (ref 101–111)
CO2: 23 mmol/L (ref 22–32)
CREATININE: 1.04 mg/dL — AB (ref 0.44–1.00)
Glucose, Bld: 90 mg/dL (ref 65–99)
Potassium: 4 mmol/L (ref 3.5–5.1)
Sodium: 136 mmol/L (ref 135–145)
Total Protein: 6.2 g/dL — ABNORMAL LOW (ref 6.5–8.1)

## 2015-10-19 LAB — CBC WITH DIFFERENTIAL/PLATELET
Basophils Absolute: 0.1 10*3/uL (ref 0.0–0.1)
Basophils Relative: 1 %
EOS PCT: 2 %
Eosinophils Absolute: 0.2 10*3/uL (ref 0.0–0.7)
HEMATOCRIT: 43.8 % (ref 36.0–46.0)
Hemoglobin: 14 g/dL (ref 12.0–15.0)
LYMPHS ABS: 2.9 10*3/uL (ref 0.7–4.0)
LYMPHS PCT: 43 %
MCH: 28.1 pg (ref 26.0–34.0)
MCHC: 32 g/dL (ref 30.0–36.0)
MCV: 88 fL (ref 78.0–100.0)
MONO ABS: 0.5 10*3/uL (ref 0.1–1.0)
Monocytes Relative: 7 %
Neutro Abs: 3.2 10*3/uL (ref 1.7–7.7)
Neutrophils Relative %: 47 %
PLATELETS: 235 10*3/uL (ref 150–400)
RBC: 4.98 MIL/uL (ref 3.87–5.11)
RDW: 14.4 % (ref 11.5–15.5)
WBC: 6.8 10*3/uL (ref 4.0–10.5)

## 2015-10-19 LAB — TYPE AND SCREEN
ABO/RH(D): O POS
Antibody Screen: NEGATIVE

## 2015-10-19 LAB — SURGICAL PCR SCREEN
MRSA, PCR: POSITIVE — AB
STAPHYLOCOCCUS AUREUS: POSITIVE — AB

## 2015-10-19 LAB — URINALYSIS, ROUTINE W REFLEX MICROSCOPIC
GLUCOSE, UA: NEGATIVE mg/dL
Hgb urine dipstick: NEGATIVE
Ketones, ur: NEGATIVE mg/dL
LEUKOCYTES UA: NEGATIVE
Nitrite: NEGATIVE
PROTEIN: NEGATIVE mg/dL
Specific Gravity, Urine: 1.033 — ABNORMAL HIGH (ref 1.005–1.030)
pH: 5 (ref 5.0–8.0)

## 2015-10-19 LAB — PROTIME-INR
INR: 0.91 (ref 0.00–1.49)
Prothrombin Time: 12.4 seconds (ref 11.6–15.2)

## 2015-10-19 LAB — APTT: aPTT: 30 seconds (ref 24–37)

## 2015-10-19 NOTE — Pre-Procedure Instructions (Addendum)
Stephanie Gregory  10/19/2015      [facility] - [place] - [address] [address] [place] [address] Phone: [phone] Fax: [phone]    Your procedure is scheduled on 10/27/15  Report to [facility] Admitting at 530 A.M.  Call this number if you have problems the morning of surgery:  [phone]   Remember:  Do not eat food or drink liquids after midnight.  Take these medicines the morning of surgery with A SIP OF WATER xanax if needed,imitrex,maxalt if needed,,estrace,gabapentin, effexor,topimax if needed  STOP all herbel meds, nsaids (aleve,naproxen,advil,ibuprofen) 5 days prior to surgery starting7/15/17 including aspirin, meloxicam,vitamins   Do not wear jewelry, make-up or nail polish.  Do not wear lotions, powders, or perfumes.  You may wear deoderant.  Do not shave 48 hours prior to surgery.  Men may shave face and neck.  Do not bring valuables to the hospital.  [facility] is not responsible for any belongings or valuables.  Contacts, dentures or bridgework may not be worn into surgery.  Leave your suitcase in the car.  After surgery it may be brought to your room.  For patients admitted to the hospital, discharge time will be determined by your treatment team.  Patients discharged the day of surgery will not be allowed to drive home.   Name and phone number of your driver Special instructions:   Special Instructions: [place] - Preparing for Surgery  Before surgery, you can play an important role.  Because skin is not sterile, your skin needs to be as free of germs as possible.  You can reduce the number of germs on you skin by washing with CHG (chlorahexidine gluconate) soap before surgery.  CHG is an antiseptic cleaner which kills germs and bonds with the skin to continue killing germs even after washing.  Please DO NOT use if you have an allergy to CHG or antibacterial soaps.  If your  skin becomes reddened/irritated stop using the CHG and inform your nurse when you arrive at Short Stay.  Do not shave (including legs and underarms) for at least 48 hours prior to the first CHG shower.  You may shave your face.  Please follow these instructions carefully:   1.  Shower with CHG Soap the night before surgery and the morning of Surgery.  2.  If you choose to wash your hair, wash your hair first as usual with your normal shampoo.  3.  After you shampoo, rinse your hair and body thoroughly to remove the Shampoo.  4.  Use CHG as you would any other liquid soap.  You can apply chg directly  to the skin and wash gently with scrungie or a clean washcloth.  5.  Apply the CHG Soap to your body ONLY FROM THE NECK DOWN.  Do not use on open wounds or open sores.  Avoid contact with your eyes ears, mouth and genitals (private parts).  Wash genitals (private parts)       with your normal soap.  6.  Wash thoroughly, paying special attention to the area where your surgery will be performed.  7.  Thoroughly rinse your body with warm water from the neck down.  8.  DO NOT shower/wash with your normal soap after using and rinsing off the CHG Soap.  9.  Pat yourself dry with a clean towel.            10.  Wear clean pajamas.            11.  Place clean sheets on your bed the night of your first shower and do not sleep with pets.  Day of Surgery  Do not apply any lotions/deodorants the morning of surgery.  Please wear clean clothes to the hospital/surgery center.  Please read over the following fact sheets that you were given. Pain Booklet and MRSA Information

## 2015-10-26 NOTE — H&P (Signed)
PREOPERATIVE H&P  Chief Complaint: neck pain  HPI: Stephanie Gregory is a 50 y.o. female who presents with ongoing pain in the neck. Patient had excellent temporary relief with facet injections, but continues to have ongoing pain  MRI reveals facet arthrosis in the C-spine, particularly at C2/3  Patient has failed multiple forms of conservative care and continues to have pain (see office notes for additional details regarding the patient's full course of treatment)  Past Medical History  Diagnosis Date  . Anxiety   . GERD (gastroesophageal reflux disease)   . Migraine   . Mental disorder     Depression / Anxiety  . Vaginal Pap smear, abnormal     CIN I - History  . SVD (spontaneous vaginal delivery)     x 3  . Depression   . DDD (degenerative disc disease)     has metal plate C4-C5 diskectomy  . H/O seasonal allergies     pollen and mold  . Numbness and tingling     left 5th finger and ring finger radiates downward from neck area   . Shaking     with anxiety   . Bunion of left foot   . Panic attacks   . Shortness of breath dyspnea     walking distances or climbing stairs none now since quit smoking (1 month)  . Cancer [facility])     anal   Past Surgical History  Procedure Laterality Date  . Neck surgery  12/1999    C4-C5 diskectomy  . Nasal sinus surgery  2000  . Dilatation & currettage/hysteroscopy with resectocope N/A 06/16/2013    Procedure: DILATATION & CURETTAGE/HYSTEROSCOPY WITH RESECTOCOPE/CERVICAL POLYPECTOMY;  Surgeon: Sanjuana Kava, MD;  Location: [facility];  Service: Gynecology;  Laterality: N/A;  . Laparoscopy N/A 06/16/2013    Procedure: LAPAROSCOPY OPERATIVE SALPINGO OOPHORECTOMY;  Surgeon: Sanjuana Kava, MD;  Location: [facility];  Service: Gynecology;  Laterality: N/A;  . Tubal ligation  10/2000  . Wisdom tooth extraction    . Cystoscopy N/A 09/15/2013    Procedure: CYSTOSCOPY;  Surgeon: Sanjuana Kava, MD;  Location: [facility];  Service: Gynecology;  Laterality: N/A;  .  Laparoscopic assisted vaginal hysterectomy Right 09/15/2013    Procedure: LAPAROSCOPIC ASSISTED VAGINAL HYSTERECTOMY with right salpingo-oophorectomy;  Surgeon: Sanjuana Kava, MD;  Location: [facility];  Service: Gynecology;  Laterality: Right;  . Anterior cervical decomp/discectomy fusion N/A 02/03/2014    Procedure: ANTERIOR CERVICAL DECOMPRESSION/DISCECTOMY FUSION 2 LEVELS;  Surgeon: Sinclair Ship, MD;  Location: [facility];  Service: Orthopedics;  Laterality: N/A;  Anterior cervical decompression fusion, cervical 5-6, cervical 6-7 with instrumentation and allograft  . Hardware removal N/A 02/03/2014    Procedure: HARDWARE REMOVAL;  Surgeon: Sinclair Ship, MD;  Location: [facility];  Service: Orthopedics;  Laterality: N/A;  removal of hardware cervical 4-5.  Marland Kitchen Anterior cervical decomp/discectomy fusion N/A 06/09/2014    Procedure: ANTERIOR CERVICAL DECOMPRESSION/DISCECTOMY FUSION 1 LEVEL;  Surgeon: Sinclair Ship, MD;  Location: [facility];  Service: Orthopedics;  Laterality: N/A;  Anterior cervical decompression fusion, cervical 3-4 with instrumentation and allograft  . Abdominal hysterectomy    . Dilation and curettage of uterus    . Examination under anesthesia N/A 10/28/2014    Procedure: EXAM UNDER ANESTHESIA, REMOVAL OF SQUAMOUS CELL CARCINOMA IN SITU OF THE ANUS;  Surgeon: Jackolyn Confer, MD;  Location: [facility];  Service: General;  Laterality: N/A;   Social History   Social History  . Marital Status: Single  Spouse Name: N/A  . Number of Children: N/A  . Years of Education: N/A   Social History Main Topics  . Smoking status: Former Smoker -- 1.00 packs/day for 31 years    Types: Cigarettes    Quit date: 09/08/2014  . Smokeless tobacco: Never Used  . Alcohol Use: No     Comment: past hx of occas use 6-7 yrs per pt  . Drug Use: No     Comment: once or twice weekly, but daily use this past weekend-stated stopped 6 weeks ago  . Sexual Activity: Yes    Birth Control/ Protection:  Surgical   Other Topics Concern  . Not on file   Social History Narrative   No family history on file. Allergies  Allergen Reactions  . Bactrim [Sulfamethoxazole-Trimethoprim] Rash    Rash due to Bactrim  . Cyclobenzaprine Swelling    tongue  . Methocarbamol Swelling    tongue   Prior to Admission medications   Medication Sig Start Date End Date Taking? Authorizing Provider  ALPRAZolam Duanne Moron) 0.5 MG tablet Take 0.5 mg by mouth 3 (three) times daily.   Yes Historical Provider, MD  aspirin EC 325 MG tablet Take 1 tablet (325 mg total) by mouth 2 (two) times daily. 02/21/15  Yes Silverio Decamp, MD  atenolol (TENORMIN) 25 MG tablet Take 1 tablet (25 mg total) by mouth daily. 03/28/15  Yes Silverio Decamp, MD  docusate sodium (COLACE) 100 MG capsule Take 100 mg by mouth daily.    Yes Historical Provider, MD  estradiol (ESTRACE) 1 MG tablet TAKE 1 TABLET BY MOUTH DAILY 10/04/15  Yes Silverio Decamp, MD  fluticasone [facility]) 50 MCG/ACT nasal spray One spray in each nostril twice a day, use left hand for right nostril, and right hand for left nostril. 11/22/14  Yes Silverio Decamp, MD  gabapentin (NEURONTIN) 300 MG capsule One tab PO qHS for a week, then BID for a week, then TID. May double weekly to a max of 3,600mg /day Patient taking differently: Take 300 mg by mouth 3 (three) times daily.  09/08/15  Yes Silverio Decamp, MD  meloxicam (MOBIC) 15 MG tablet One tab PO qAM with breakfast for 2 weeks, then daily prn pain. 09/09/15  Yes Silverio Decamp, MD  montelukast (SINGULAIR) 10 MG tablet Take 1 tablet (10 mg total) by mouth at bedtime. 03/09/15  Yes Silverio Decamp, MD  MOVANTIK 25 MG TABS tablet Take 25 mg by mouth every morning. 10/03/15  Yes Historical Provider, MD  nicotine (NICODERM CQ) 21 mg/24hr patch 1 patch daily for 6 weeks then 14 mg daily for 2 weeks then 7 mg daily for 2 weeks then stop Patient taking differently: Place 14 mg onto the skin  daily. 14 mg daily for 2 weeks then 7 mg daily for 2 weeks then stop 09/12/15  Yes Silverio Decamp, MD  polycarbophil (FIBERCON) 625 MG tablet Take 625 mg by mouth daily.    Yes Historical Provider, MD  PROCTOZONE-HC 2.5 % rectal cream Place 1 application rectally daily as needed for hemorrhoids or itching.  08/19/14  Yes Historical Provider, MD  promethazine (PHENERGAN) 12.5 MG tablet TAKE ONE TABLET BY MOUTH EVERY 4 HOURS AS NEEDED FOR NAUSEA/VOMITING 04/07/15  Yes Silverio Decamp, MD  rizatriptan (MAXALT) 10 MG tablet Take 10 mg by mouth daily as needed. Migraines 09/13/15  Yes Historical Provider, MD  rOPINIRole (REQUIP) 1 MG tablet Take 1 tablet (1 mg total) by mouth at  bedtime. 09/23/14  Yes Silverio Decamp, MD  SUMAtriptan (IMITREX) 50 MG tablet Take 1 tablet (50 mg total) by mouth every 2 (two) hours as needed for migraine. May repeat in 2 hours if headache persists or recurs. 08/16/15  Yes Silverio Decamp, MD  tiZANidine (ZANAFLEX) 4 MG tablet Take 4 mg by mouth 2 (two) times daily. 10/04/15  Yes Historical Provider, MD  topiramate (TOPAMAX) 200 MG tablet Take 1 tablet (200 mg total) by mouth daily. 04/22/15  Yes Silverio Decamp, MD  traZODone (DESYREL) 300 MG tablet Take 1 tablet (300 mg total) by mouth at bedtime. 09/23/14  Yes Silverio Decamp, MD  tretinoin (RETIN-A) 0.05 % cream Apply 1 application topically at bedtime.    Yes Historical Provider, MD  venlafaxine XR (EFFEXOR-XR) 75 MG 24 hr capsule TAKE 1 CAPSULE BY MOUTH DAILY WITH 150MG DOSE Patient taking differently: TAKE 1 CAPSULE BY MOUTH DAILY WITH 150MG DOSE plus 75 mg to equal 225mg  09/30/15  Yes Hali Marry, MD  busPIRone (BUSPAR) 5 MG tablet TAKE ONE (1) TABLET BY MOUTH 3 TIMES DAILY Patient not taking: Reported on 10/17/2015 09/07/15   Silverio Decamp, MD  dextromethorphan (DELSYM) 30 MG/5ML liquid Take 5 mLs (30 mg total) by mouth 2 (two) times daily. Patient not taking: Reported on  10/17/2015 06/02/15   Silverio Decamp, MD  hydrOXYzine (VISTARIL) 50 MG capsule TAKE ONE CAPSULE BY MOUTH EVERY 6 HOURS Patient not taking: Reported on 10/17/2015 04/22/15   Silverio Decamp, MD  lidocaine (LIDODERM) 5 % Place 1 patch onto the skin every 12 (twelve) hours. Remove & Discard patch within 12 hours or as directed by MD Patient not taking: Reported on 10/17/2015 09/08/15   Silverio Decamp, MD  linaclotide [facility]) 72 MCG capsule Take 72 mcg by mouth daily before breakfast.    Historical Provider, MD  magnesium oxide (MAG-OX) 400 MG tablet Take 2 tablets (800 mg total) by mouth at bedtime. Patient not taking: Reported on 10/17/2015 04/22/15   Silverio Decamp, MD     All other systems have been reviewed and were otherwise negative with the exception of those mentioned in the HPI and as above.  Physical Exam: There were no vitals filed for this visit.  General: Alert, no acute distress Cardiovascular: No pedal edema Respiratory: No cyanosis, no use of accessory musculature Skin: No lesions in the area of chief complaint Neurologic: Sensation intact distally Psychiatric: Patient is competent for consent with normal mood and affect Lymphatic: No axillary or cervical lymphadenopathy  MUSCULOSKELETAL: + pain with neck rotation  Assessment/Plan: Facet arthrosis Plan for Procedure(s): POSTERIOR SPINAL FUSION, CERVICAL 2-3, CERVICAL 3-4, CERVICAL 4-5 WITH INSTRUMENTATION AND ALLOGRAFT   Sinclair Ship, MD 10/26/2015 8:10 AM

## 2015-10-27 ENCOUNTER — Encounter (HOSPITAL_COMMUNITY)
Admission: RE | Disposition: A | Discharge status: Home / Self Care | Payer: Self-pay | Source: Ambulatory Visit | Attending: Orthopedic Surgery

## 2015-10-27 ENCOUNTER — Encounter (HOSPITAL_COMMUNITY): Payer: Self-pay | Admitting: Surgery

## 2015-10-27 ENCOUNTER — Ambulatory Visit (HOSPITAL_COMMUNITY): Payer: BLUE CROSS/BLUE SHIELD

## 2015-10-27 ENCOUNTER — Ambulatory Visit (HOSPITAL_COMMUNITY): Payer: BLUE CROSS/BLUE SHIELD | Admitting: Anesthesiology

## 2015-10-27 ENCOUNTER — Observation Stay (HOSPITAL_COMMUNITY)
Admission: RE | Admit: 2015-10-27 | Discharge: 2015-10-28 | Disposition: A | Discharge status: Home / Self Care | Payer: BLUE CROSS/BLUE SHIELD | Source: Ambulatory Visit | Attending: Orthopedic Surgery | Admitting: Orthopedic Surgery

## 2015-10-27 ENCOUNTER — Observation Stay (HOSPITAL_COMMUNITY): Payer: BLUE CROSS/BLUE SHIELD

## 2015-10-27 DIAGNOSIS — Z7982 Long term (current) use of aspirin: Secondary | ICD-10-CM | POA: Diagnosis not present

## 2015-10-27 DIAGNOSIS — Z87891 Personal history of nicotine dependence: Secondary | ICD-10-CM | POA: Diagnosis not present

## 2015-10-27 DIAGNOSIS — Z85048 Personal history of other malignant neoplasm of rectum, rectosigmoid junction, and anus: Secondary | ICD-10-CM | POA: Diagnosis not present

## 2015-10-27 DIAGNOSIS — Z419 Encounter for procedure for purposes other than remedying health state, unspecified: Secondary | ICD-10-CM

## 2015-10-27 DIAGNOSIS — M47812 Spondylosis without myelopathy or radiculopathy, cervical region: Principal | ICD-10-CM | POA: Diagnosis present

## 2015-10-27 DIAGNOSIS — K219 Gastro-esophageal reflux disease without esophagitis: Secondary | ICD-10-CM | POA: Insufficient documentation

## 2015-10-27 DIAGNOSIS — Z09 Encounter for follow-up examination after completed treatment for conditions other than malignant neoplasm: Secondary | ICD-10-CM

## 2015-10-27 HISTORY — PX: POSTERIOR CERVICAL FUSION/FORAMINOTOMY: SHX5038

## 2015-10-27 SURGERY — POSTERIOR CERVICAL FUSION/FORAMINOTOMY LEVEL 3
Anesthesia: General | Site: Neck

## 2015-10-27 MED ORDER — ACETAMINOPHEN 650 MG RE SUPP: 650.0000 mg | RECTAL | Status: DC | PRN

## 2015-10-27 MED ORDER — VENLAFAXINE HCL ER 75 MG PO CP24
225.0000 mg | ORAL_CAPSULE | Freq: Every day | ORAL | Status: DC
  Filled 2015-10-27: qty 1

## 2015-10-27 MED ORDER — THROMBIN 20000 UNITS EX SOLR
CUTANEOUS | Status: AC
  Filled 2015-10-27: qty 20000

## 2015-10-27 MED ORDER — SENNOSIDES-DOCUSATE SODIUM 8.6-50 MG PO TABS
1.0000 | ORAL_TABLET | Freq: Every evening | ORAL | Status: DC | PRN
  Administered 2015-10-27: 1 via ORAL
  Filled 2015-10-27: qty 1

## 2015-10-27 MED ORDER — ONDANSETRON HCL 4 MG/2ML IJ SOLN
INTRAMUSCULAR | Status: DC | PRN
  Administered 2015-10-27: 4 mg via INTRAVENOUS

## 2015-10-27 MED ORDER — BACITRACIN ZINC 500 UNIT/GM EX OINT
TOPICAL_OINTMENT | CUTANEOUS | Status: DC | PRN
  Administered 2015-10-27: 1 via TOPICAL

## 2015-10-27 MED ORDER — DOCUSATE SODIUM 100 MG PO CAPS: 100.0000 mg | ORAL_CAPSULE | Freq: Every day | ORAL | Status: DC

## 2015-10-27 MED ORDER — BUPIVACAINE-EPINEPHRINE 0.25% -1:200000 IJ SOLN
INTRAMUSCULAR | Status: DC | PRN
  Administered 2015-10-27: 6 mL
  Administered 2015-10-27: 10 mL

## 2015-10-27 MED ORDER — ONDANSETRON HCL 4 MG/2ML IJ SOLN
INTRAMUSCULAR | Status: AC
  Filled 2015-10-27: qty 2

## 2015-10-27 MED ORDER — THROMBIN 20000 UNITS EX KIT
PACK | CUTANEOUS | Status: DC | PRN
  Administered 2015-10-27: 20 mL via TOPICAL

## 2015-10-27 MED ORDER — MIDAZOLAM HCL 5 MG/5ML IJ SOLN
INTRAMUSCULAR | Status: DC | PRN
  Administered 2015-10-27: 2 mg via INTRAVENOUS

## 2015-10-27 MED ORDER — LIDOCAINE HCL (CARDIAC) 20 MG/ML IV SOLN
INTRAVENOUS | Status: DC | PRN
  Administered 2015-10-27: 80 mg via INTRAVENOUS

## 2015-10-27 MED ORDER — PROPOFOL 10 MG/ML IV BOLUS
INTRAVENOUS | Status: DC | PRN
  Administered 2015-10-27: 200 mg via INTRAVENOUS

## 2015-10-27 MED ORDER — FLEET ENEMA 7-19 GM/118ML RE ENEM: 1.0000 | ENEMA | Freq: Once | RECTAL | Status: DC | PRN

## 2015-10-27 MED ORDER — OXYCODONE HCL 5 MG/5ML PO SOLN: 10.0000 mg | Freq: Once | ORAL | Status: AC | PRN

## 2015-10-27 MED ORDER — ROPINIROLE HCL 1 MG PO TABS
1.0000 mg | ORAL_TABLET | Freq: Every day | ORAL | Status: DC
  Administered 2015-10-27: 1 mg via ORAL
  Filled 2015-10-27: qty 1

## 2015-10-27 MED ORDER — BUPIVACAINE-EPINEPHRINE (PF) 0.25% -1:200000 IJ SOLN
INTRAMUSCULAR | Status: AC
  Filled 2015-10-27: qty 30

## 2015-10-27 MED ORDER — SODIUM CHLORIDE 0.9 % IV SOLN: 250.0000 mL | INTRAVENOUS | Status: DC

## 2015-10-27 MED ORDER — PROMETHAZINE HCL 12.5 MG PO TABS
12.5000 mg | ORAL_TABLET | ORAL | Status: DC | PRN
  Administered 2015-10-27 – 2015-10-28 (×2): 12.5 mg via ORAL
  Filled 2015-10-27 (×3): qty 1

## 2015-10-27 MED ORDER — OXYCODONE HCL 5 MG PO TABS
ORAL_TABLET | ORAL | Status: AC
  Filled 2015-10-27: qty 2

## 2015-10-27 MED ORDER — PHENYLEPHRINE 40 MCG/ML (10ML) SYRINGE FOR IV PUSH (FOR BLOOD PRESSURE SUPPORT)
PREFILLED_SYRINGE | INTRAVENOUS | Status: DC | PRN
  Administered 2015-10-27: 80 ug via INTRAVENOUS

## 2015-10-27 MED ORDER — EPHEDRINE SULFATE-NACL 50-0.9 MG/10ML-% IV SOSY
PREFILLED_SYRINGE | INTRAVENOUS | Status: DC | PRN
Start: 2015-10-27 — End: 2015-10-27
  Administered 2015-10-27: 10 mg via INTRAVENOUS
  Administered 2015-10-27 (×2): 20 mg via INTRAVENOUS
  Administered 2015-10-27: 10 mg via INTRAVENOUS

## 2015-10-27 MED ORDER — BACITRACIN ZINC 500 UNIT/GM EX OINT
TOPICAL_OINTMENT | CUTANEOUS | Status: AC
  Filled 2015-10-27: qty 28.35

## 2015-10-27 MED ORDER — ALUM & MAG HYDROXIDE-SIMETH 200-200-20 MG/5ML PO SUSP: 30.0000 mL | Freq: Four times a day (QID) | ORAL | Status: DC | PRN

## 2015-10-27 MED ORDER — OXYCODONE-ACETAMINOPHEN 5-325 MG PO TABS
1.0000 | ORAL_TABLET | ORAL | Status: DC | PRN
  Administered 2015-10-27 – 2015-10-28 (×5): 2 via ORAL
  Filled 2015-10-27 (×5): qty 2

## 2015-10-27 MED ORDER — ACETAMINOPHEN 325 MG PO TABS
650.0000 mg | ORAL_TABLET | ORAL | Status: DC | PRN
  Administered 2015-10-28: 650 mg via ORAL
  Filled 2015-10-27: qty 2

## 2015-10-27 MED ORDER — ONDANSETRON HCL 4 MG/2ML IJ SOLN: 4.0000 mg | INTRAMUSCULAR | Status: DC | PRN

## 2015-10-27 MED ORDER — PROPOFOL 10 MG/ML IV BOLUS
INTRAVENOUS | Status: AC
  Filled 2015-10-27: qty 20

## 2015-10-27 MED ORDER — KETAMINE HCL 100 MG/ML IJ SOLN
INTRAMUSCULAR | Status: AC
  Filled 2015-10-27: qty 1

## 2015-10-27 MED ORDER — SODIUM CHLORIDE 0.9 % IV SOLN
INTRAVENOUS | Status: DC
  Administered 2015-10-27: 14:00:00 via INTRAVENOUS

## 2015-10-27 MED ORDER — SUMATRIPTAN SUCCINATE 50 MG PO TABS
50.0000 mg | ORAL_TABLET | Freq: Once | ORAL | Status: DC
  Filled 2015-10-27 (×2): qty 1

## 2015-10-27 MED ORDER — HYDROMORPHONE HCL 1 MG/ML IJ SOLN
0.2500 mg | INTRAMUSCULAR | Status: DC | PRN
  Administered 2015-10-27 (×2): 0.5 mg via INTRAVENOUS

## 2015-10-27 MED ORDER — ROCURONIUM BROMIDE 100 MG/10ML IV SOLN
INTRAVENOUS | Status: DC | PRN
  Administered 2015-10-27: 20 mg via INTRAVENOUS
  Administered 2015-10-27 (×2): 50 mg via INTRAVENOUS

## 2015-10-27 MED ORDER — DIAZEPAM 5 MG PO TABS
5.0000 mg | ORAL_TABLET | Freq: Four times a day (QID) | ORAL | Status: DC | PRN
  Administered 2015-10-27 – 2015-10-28 (×3): 5 mg via ORAL
  Filled 2015-10-27 (×3): qty 1

## 2015-10-27 MED ORDER — FLUTICASONE PROPIONATE 50 MCG/ACT NA SUSP
1.0000 | Freq: Every day | NASAL | Status: DC
  Filled 2015-10-27: qty 16

## 2015-10-27 MED ORDER — FENTANYL CITRATE (PF) 250 MCG/5ML IJ SOLN
INTRAMUSCULAR | Status: AC
  Filled 2015-10-27: qty 5

## 2015-10-27 MED ORDER — ROCURONIUM BROMIDE 50 MG/5ML IV SOLN
INTRAVENOUS | Status: AC
  Filled 2015-10-27: qty 1

## 2015-10-27 MED ORDER — TOPIRAMATE 100 MG PO TABS
200.0000 mg | ORAL_TABLET | Freq: Every day | ORAL | Status: DC
  Administered 2015-10-28: 200 mg via ORAL
  Filled 2015-10-27: qty 2

## 2015-10-27 MED ORDER — PROMETHAZINE HCL 25 MG/ML IJ SOLN: 6.2500 mg | INTRAMUSCULAR | Status: DC | PRN

## 2015-10-27 MED ORDER — KETAMINE HCL 100 MG/ML IJ SOLN
INTRAMUSCULAR | Status: DC | PRN
  Administered 2015-10-27 (×2): 31 mg via INTRAVENOUS

## 2015-10-27 MED ORDER — SODIUM CHLORIDE 0.9% FLUSH: 3.0000 mL | INTRAVENOUS | Status: DC | PRN

## 2015-10-27 MED ORDER — MORPHINE SULFATE (PF) 2 MG/ML IV SOLN
1.0000 mg | INTRAVENOUS | Status: DC | PRN
  Administered 2015-10-27: 2 mg via INTRAVENOUS
  Filled 2015-10-27: qty 1

## 2015-10-27 MED ORDER — CEFAZOLIN IN D5W 1 GM/50ML IV SOLN
1.0000 g | Freq: Three times a day (TID) | INTRAVENOUS | Status: AC
  Administered 2015-10-27 (×2): 1 g via INTRAVENOUS
  Filled 2015-10-27 (×2): qty 50

## 2015-10-27 MED ORDER — TRAZODONE HCL 150 MG PO TABS
300.0000 mg | ORAL_TABLET | Freq: Every day | ORAL | Status: DC
  Administered 2015-10-27: 300 mg via ORAL
  Filled 2015-10-27: qty 2

## 2015-10-27 MED ORDER — PHENOL 1.4 % MT LIQD
1.0000 | OROMUCOSAL | Status: DC | PRN
Start: 2015-10-27 — End: 2015-10-28

## 2015-10-27 MED ORDER — CEFAZOLIN SODIUM-DEXTROSE 2-4 GM/100ML-% IV SOLN
INTRAVENOUS | Status: AC
  Filled 2015-10-27: qty 100

## 2015-10-27 MED ORDER — DOCUSATE SODIUM 100 MG PO CAPS
100.0000 mg | ORAL_CAPSULE | Freq: Two times a day (BID) | ORAL | Status: DC
  Administered 2015-10-27: 100 mg via ORAL
  Filled 2015-10-27: qty 1

## 2015-10-27 MED ORDER — LINACLOTIDE 72 MCG PO CAPS
72.0000 ug | ORAL_CAPSULE | Freq: Every day | ORAL | Status: DC
  Filled 2015-10-27: qty 1

## 2015-10-27 MED ORDER — GABAPENTIN 300 MG PO CAPS
300.0000 mg | ORAL_CAPSULE | Freq: Three times a day (TID) | ORAL | Status: DC
  Administered 2015-10-27 – 2015-10-28 (×3): 300 mg via ORAL
  Filled 2015-10-27 (×3): qty 1

## 2015-10-27 MED ORDER — HYDROCORTISONE 2.5 % RE CREA
1.0000 "application " | TOPICAL_CREAM | Freq: Every day | RECTAL | Status: DC | PRN
  Filled 2015-10-27: qty 28.35

## 2015-10-27 MED ORDER — SUCCINYLCHOLINE CHLORIDE 200 MG/10ML IV SOSY
PREFILLED_SYRINGE | INTRAVENOUS | Status: AC
  Filled 2015-10-27: qty 10

## 2015-10-27 MED ORDER — OXYCODONE HCL 5 MG PO TABS
10.0000 mg | ORAL_TABLET | Freq: Once | ORAL | Status: AC | PRN
  Administered 2015-10-27: 10 mg via ORAL

## 2015-10-27 MED ORDER — FENTANYL CITRATE (PF) 100 MCG/2ML IJ SOLN
INTRAMUSCULAR | Status: DC | PRN
  Administered 2015-10-27 (×6): 50 ug via INTRAVENOUS

## 2015-10-27 MED ORDER — BISACODYL 5 MG PO TBEC: 5.0000 mg | DELAYED_RELEASE_TABLET | Freq: Every day | ORAL | Status: DC | PRN

## 2015-10-27 MED ORDER — ZOLPIDEM TARTRATE 5 MG PO TABS: 5.0000 mg | ORAL_TABLET | Freq: Every evening | ORAL | Status: DC | PRN

## 2015-10-27 MED ORDER — POVIDONE-IODINE 7.5 % EX SOLN
Freq: Once | CUTANEOUS | Status: DC
  Filled 2015-10-27: qty 118

## 2015-10-27 MED ORDER — EPHEDRINE 5 MG/ML INJ
INTRAVENOUS | Status: AC
  Filled 2015-10-27: qty 10

## 2015-10-27 MED ORDER — SUGAMMADEX SODIUM 200 MG/2ML IV SOLN
INTRAVENOUS | Status: DC | PRN
  Administered 2015-10-27: 200 mg via INTRAVENOUS

## 2015-10-27 MED ORDER — MENTHOL 3 MG MT LOZG: 1.0000 | LOZENGE | OROMUCOSAL | Status: DC | PRN

## 2015-10-27 MED ORDER — HYDROMORPHONE HCL 1 MG/ML IJ SOLN
INTRAMUSCULAR | Status: AC
  Filled 2015-10-27: qty 1

## 2015-10-27 MED ORDER — CEFAZOLIN SODIUM-DEXTROSE 2-4 GM/100ML-% IV SOLN
2.0000 g | INTRAVENOUS | Status: AC
  Administered 2015-10-27: 2 g via INTRAVENOUS

## 2015-10-27 MED ORDER — SODIUM CHLORIDE 0.9% FLUSH
3.0000 mL | Freq: Two times a day (BID) | INTRAVENOUS | Status: DC
  Administered 2015-10-27: 3 mL via INTRAVENOUS

## 2015-10-27 MED ORDER — SUMATRIPTAN SUCCINATE 50 MG PO TABS
50.0000 mg | ORAL_TABLET | ORAL | Status: DC | PRN
  Administered 2015-10-27 (×2): 50 mg via ORAL
  Filled 2015-10-27: qty 1

## 2015-10-27 MED ORDER — LACTATED RINGERS IV SOLN
INTRAVENOUS | Status: DC | PRN
  Administered 2015-10-27 (×3): via INTRAVENOUS

## 2015-10-27 MED ORDER — ATENOLOL 25 MG PO TABS
25.0000 mg | ORAL_TABLET | Freq: Every day | ORAL | Status: DC
  Administered 2015-10-28: 25 mg via ORAL
  Filled 2015-10-27: qty 1

## 2015-10-27 MED ORDER — PHENYLEPHRINE HCL 10 MG/ML IJ SOLN
10.0000 mg | INTRAVENOUS | Status: DC | PRN
  Administered 2015-10-27: 40 ug/min via INTRAVENOUS

## 2015-10-27 MED ORDER — MIDAZOLAM HCL 2 MG/2ML IJ SOLN
INTRAMUSCULAR | Status: AC
  Filled 2015-10-27: qty 2

## 2015-10-27 MED ORDER — ALPRAZOLAM 0.5 MG PO TABS
0.5000 mg | ORAL_TABLET | Freq: Three times a day (TID) | ORAL | Status: DC
  Administered 2015-10-27 – 2015-10-28 (×3): 0.5 mg via ORAL
  Filled 2015-10-27 (×3): qty 1

## 2015-10-27 MED ORDER — NALOXEGOL OXALATE 25 MG PO TABS
25.0000 mg | ORAL_TABLET | Freq: Every morning | ORAL | Status: DC
  Administered 2015-10-27 – 2015-10-28 (×2): 25 mg via ORAL
  Filled 2015-10-27 (×2): qty 1

## 2015-10-27 MED ORDER — ESTRADIOL 1 MG PO TABS
1.0000 mg | ORAL_TABLET | Freq: Every day | ORAL | Status: DC
  Administered 2015-10-28: 1 mg via ORAL
  Filled 2015-10-27: qty 1

## 2015-10-27 MED ORDER — NICOTINE 14 MG/24HR TD PT24
14.0000 mg | MEDICATED_PATCH | Freq: Every day | TRANSDERMAL | Status: DC
  Administered 2015-10-27: 14 mg via TRANSDERMAL
  Filled 2015-10-27: qty 1

## 2015-10-27 MED ORDER — CALCIUM POLYCARBOPHIL 625 MG PO TABS
625.0000 mg | ORAL_TABLET | Freq: Every day | ORAL | Status: DC
  Administered 2015-10-28: 625 mg via ORAL
  Filled 2015-10-27 (×2): qty 1

## 2015-10-27 MED ORDER — MONTELUKAST SODIUM 10 MG PO TABS
10.0000 mg | ORAL_TABLET | Freq: Every day | ORAL | Status: DC
  Administered 2015-10-27: 10 mg via ORAL
  Filled 2015-10-27: qty 1

## 2015-10-27 MED FILL — Heparin Sodium (Porcine) Inj 1000 Unit/ML: INTRAMUSCULAR | Qty: 30 | Status: AC

## 2015-10-27 MED FILL — Sodium Chloride IV Soln 0.9%: INTRAVENOUS | Qty: 1000 | Status: AC

## 2015-10-27 SURGICAL SUPPLY — 73 items
APL SKNCLS STERI-STRIP NONHPOA (GAUZE/BANDAGES/DRESSINGS) ×1
BENZOIN TINCTURE PRP APPL 2/3 (GAUZE/BANDAGES/DRESSINGS) ×4 IMPLANT
BIT DRILL MOUNTAINER FXED 12MM (DRILL) IMPLANT
BLADE CLIPPER SURG NEURO (BLADE) IMPLANT
BUR NEURO DRILL SOFT 3.0X3.8M (BURR) ×3 IMPLANT
BUR PRESCISION 1.7 ELITE (BURR) ×5 IMPLANT
CLOSURE WOUND 1/2 X4 (GAUZE/BANDAGES/DRESSINGS) ×1
CONT SPEC STER OR (MISCELLANEOUS) ×3 IMPLANT
CORDS BIPOLAR (ELECTRODE) ×3 IMPLANT
COVER SURGICAL LIGHT HANDLE (MISCELLANEOUS) ×3 IMPLANT
DRAIN CHANNEL 15F RND FF W/TCR (WOUND CARE) IMPLANT
DRAPE C-ARM 42X72 X-RAY (DRAPES) ×3 IMPLANT
DRAPE INCISE IOBAN 66X45 STRL (DRAPES) ×3 IMPLANT
DRAPE PED LAPAROTOMY (DRAPES) ×3 IMPLANT
DRAPE POUCH INSTRU U-SHP 10X18 (DRAPES) ×3 IMPLANT
DRAPE PROXIMA HALF (DRAPES) ×15 IMPLANT
DRAPE SURG 17X23 STRL (DRAPES) ×24 IMPLANT
DRAPE TABLE COVER HEAVY DUTY (DRAPES) ×3 IMPLANT
DRILL MOUNTAINEER FIXED 12MM (DRILL) ×3
DRSG MEPILEX BORDER 4X8 (GAUZE/BANDAGES/DRESSINGS) ×3 IMPLANT
DURAPREP 26ML APPLICATOR (WOUND CARE) ×3 IMPLANT
ELECT CAUTERY BLADE 6.4 (BLADE) ×3 IMPLANT
ELECT REM PT RETURN 9FT ADLT (ELECTROSURGICAL) ×3
ELECTRODE REM PT RTRN 9FT ADLT (ELECTROSURGICAL) ×1 IMPLANT
EVACUATOR SILICONE 100CC (DRAIN) IMPLANT
GAUZE SPONGE 4X4 12PLY STRL (GAUZE/BANDAGES/DRESSINGS) ×3 IMPLANT
GAUZE SPONGE 4X4 16PLY XRAY LF (GAUZE/BANDAGES/DRESSINGS) ×12 IMPLANT
GLOVE BIO SURGEON STRL SZ7 (GLOVE) ×3 IMPLANT
GLOVE BIO SURGEON STRL SZ8 (GLOVE) ×3 IMPLANT
GLOVE BIOGEL PI IND STRL 8 (GLOVE) ×1 IMPLANT
GLOVE BIOGEL PI INDICATOR 8 (GLOVE) ×2
GOWN STRL REUS W/ TWL LRG LVL3 (GOWN DISPOSABLE) ×4 IMPLANT
GOWN STRL REUS W/ TWL XL LVL3 (GOWN DISPOSABLE) ×1 IMPLANT
GOWN STRL REUS W/TWL LRG LVL3 (GOWN DISPOSABLE) ×12
GOWN STRL REUS W/TWL XL LVL3 (GOWN DISPOSABLE) ×3
IV CATH 14GX2 1/4 (CATHETERS) ×3 IMPLANT
KIT BASIN OR (CUSTOM PROCEDURE TRAY) ×3 IMPLANT
KIT ROOM TURNOVER OR (KITS) ×3 IMPLANT
MOUNTAINEER ROD TEMPLATE ×2 IMPLANT
NDL HYPO 25GX1X1/2 BEV (NEEDLE) ×1 IMPLANT
NEEDLE HYPO 25GX1X1/2 BEV (NEEDLE) ×3 IMPLANT
NS IRRIG 1000ML POUR BTL (IV SOLUTION) ×3 IMPLANT
PACK LAMINECTOMY ORTHO (CUSTOM PROCEDURE TRAY) ×3 IMPLANT
PAD ARMBOARD 7.5X6 YLW CONV (MISCELLANEOUS) ×6 IMPLANT
PATTIES SURGICAL .5 X.5 (GAUZE/BANDAGES/DRESSINGS) ×3 IMPLANT
PIN MAYFIELD SKULL DISP (PIN) ×3 IMPLANT
PUTTY DBX 1CC (Putty) ×3 IMPLANT
PUTTY DBX 1CC DEPUY (Putty) IMPLANT
ROD MOUNTAINEER 120MM (Rod) ×2 IMPLANT
SCREW F A 3.5X12 (Screw) ×8 IMPLANT
SCREW F A 3.5X14 (Screw) ×4 IMPLANT
SCREW INNER (Screw) ×12 IMPLANT
SPONGE GAUZE 4X4 12PLY STER LF (GAUZE/BANDAGES/DRESSINGS) ×2 IMPLANT
SPONGE INTESTINAL PEANUT (DISPOSABLE) IMPLANT
SPONGE SURGIFOAM ABS GEL 100 (HEMOSTASIS) ×3 IMPLANT
STRIP CLOSURE SKIN 1/2X4 (GAUZE/BANDAGES/DRESSINGS) ×1 IMPLANT
SURGIFLO W/THROMBIN 8M KIT (HEMOSTASIS) IMPLANT
SUT BONE WAX W31G (SUTURE) ×4 IMPLANT
SUT MNCRL AB 4-0 PS2 18 (SUTURE) ×3 IMPLANT
SUT VIC AB 0 CT1 18XCR BRD 8 (SUTURE) ×1 IMPLANT
SUT VIC AB 0 CT1 8-18 (SUTURE) ×3
SUT VIC AB 1 CT1 18XCR BRD 8 (SUTURE) ×2 IMPLANT
SUT VIC AB 1 CT1 8-18 (SUTURE) ×6
SUT VIC AB 2-0 CT2 18 VCP726D (SUTURE) ×3 IMPLANT
SYR BULB IRRIGATION 50ML (SYRINGE) ×3 IMPLANT
SYR CONTROL 10ML LL (SYRINGE) ×3 IMPLANT
TAPE CLOTH 4X10 WHT NS (GAUZE/BANDAGES/DRESSINGS) ×3 IMPLANT
TAPE CLOTH SURG 4X10 WHT LF (GAUZE/BANDAGES/DRESSINGS) ×2 IMPLANT
TOWEL OR 17X24 6PK STRL BLUE (TOWEL DISPOSABLE) ×3 IMPLANT
TOWEL OR 17X26 10 PK STRL BLUE (TOWEL DISPOSABLE) ×3 IMPLANT
TRAY FOLEY CATH 16FRSI W/METER (SET/KITS/TRAYS/PACK) ×3 IMPLANT
WATER STERILE IRR 1000ML POUR (IV SOLUTION) ×3 IMPLANT
YANKAUER SUCT BULB TIP NO VENT (SUCTIONS) ×3 IMPLANT

## 2015-10-27 NOTE — Addendum Note (Signed)
Addendum  created 10/27/15 1352 by Carney Living, CRNA   Modules edited: Anesthesia Flowsheet

## 2015-10-27 NOTE — Anesthesia Postprocedure Evaluation (Signed)
Anesthesia Post Note  Patient: Stephanie Gregory  Procedure(s) Performed: Procedure(s) (LRB): POSTERIOR SPINAL FUSION, CERVICAL 2-3, CERVICAL 3-4, CERVICAL 4-5 WITH INSTRUMENTATION AND ALLOGRAFT (N/A)  Patient location during evaluation: PACU Anesthesia Type: General Level of consciousness: awake and alert Pain management: pain level controlled Vital Signs Assessment: post-procedure vital signs reviewed and stable Respiratory status: spontaneous breathing, nonlabored ventilation, respiratory function stable and patient connected to nasal cannula oxygen Cardiovascular status: blood pressure returned to baseline and stable Postop Assessment: no signs of nausea or vomiting Anesthetic complications: no    Last Vitals:  Filed Vitals:   10/27/15 0621 10/27/15 1100  BP: 121/75 102/59  Pulse: 73 100  Temp: 36.6 C 36.3 C  Resp: 20 14    Last Pain:  Filed Vitals:   10/27/15 1102  PainSc: 8                  Zenaida Deed

## 2015-10-27 NOTE — Progress Notes (Signed)
Orthopedic Tech Progress Note Patient Details:  Stephanie Gregory [birth year] [id number]  Ortho Devices Type of Ortho Device: [place] cervical collar Ortho Device/Splint Interventions: Ordered, Application   Karolee Stamps 10/27/2015, 8:10 PM

## 2015-10-27 NOTE — Anesthesia Preprocedure Evaluation (Addendum)
Anesthesia Evaluation  Patient identified by MRN, date of birth, ID band Patient awake    Reviewed: Allergy & Precautions, NPO status , Patient's Chart, lab work & pertinent test results, reviewed documented beta blocker date and time   Airway Mallampati: II  TM Distance: >3 FB Neck ROM: Full    Dental  (+) Upper Dentures, Partial Lower, Dental Advisory Given   Pulmonary Current Smoker, former smoker,    breath sounds clear to auscultation       Cardiovascular  Rhythm:Regular Rate:Normal  EKG 01/2014 OK   Neuro/Psych Anxiety Depression    GI/Hepatic Neg liver ROS, GERD  Medicated and Controlled,  Endo/Other  negative endocrine ROS  Renal/GU negative Renal ROS     Musculoskeletal  (+) Arthritis , Osteoarthritis,    Abdominal   Peds  Hematology 13/40   Anesthesia Other Findings Recommend ketamine intraop  Reproductive/Obstetrics                           Anesthesia Physical  Anesthesia Plan  ASA: II  Anesthesia Plan: General   Post-op Pain Management:    Induction: Intravenous  Airway Management Planned: Oral ETT  Additional Equipment:   Intra-op Plan:   Post-operative Plan: Extubation in OR  Informed Consent: I have reviewed the patients History and Physical, chart, labs and discussed the procedure including the risks, benefits and alternatives for the proposed anesthesia with the patient or authorized representative who has indicated his/her understanding and acceptance.   Dental advisory given  Plan Discussed with: CRNA, Anesthesiologist and Surgeon  Anesthesia Plan Comments: (Intubation 7.0, grade 1 2015)        Anesthesia Quick Evaluation

## 2015-10-27 NOTE — Anesthesia Procedure Notes (Signed)
Procedure Name: Intubation Date/Time: 10/27/2015 7:35 AM Performed by: Carney Living Pre-anesthesia Checklist: Patient identified, Emergency Drugs available, Suction available, Patient being monitored and Timeout performed Patient Re-evaluated:Patient Re-evaluated prior to inductionOxygen Delivery Method: Circle system utilized Preoxygenation: Pre-oxygenation with 100% oxygen Intubation Type: IV induction Ventilation: Mask ventilation without difficulty Laryngoscope Size: Glidescope and 4 Tube type: Oral Tube size: 7.0 mm Number of attempts: 1 Airway Equipment and Method: Video-laryngoscopy Placement Confirmation: ETT inserted through vocal cords under direct vision,  positive ETCO2 and breath sounds checked- equal and bilateral Secured at: 21 cm Tube secured with: Tape Dental Injury: Teeth and Oropharynx as per pre-operative assessment and Injury to lip  Comments: Easy Glidescope intubation, DL X1, 7.0 ETT easily passed, VSS, no neck extension throughout DL and intubation.

## 2015-10-27 NOTE — Transfer of Care (Signed)
Immediate Anesthesia Transfer of Care Note  Patient: Stephanie Gregory  Procedure(s) Performed: Procedure(s) with comments: POSTERIOR SPINAL FUSION, CERVICAL 2-3, CERVICAL 3-4, CERVICAL 4-5 WITH INSTRUMENTATION AND ALLOGRAFT (N/A) - POSTERIOR SPINAL FUSION, CERVICAL 2-3, CERVICAL 3-4, CERVICAL 4-5 WITH INSTRUMENTATION AND ALLOGRAFT  Patient Location: PACU  Anesthesia Type:General  Level of Consciousness: awake, alert , oriented and patient cooperative  Airway & Oxygen Therapy: Patient Spontanous Breathing and Patient connected to nasal cannula oxygen  Post-op Assessment: Report given to RN, Post -op Vital signs reviewed and stable and Patient moving all extremities X 4  Post vital signs: Reviewed and stable  Last Vitals:  Filed Vitals:   10/27/15 0621  BP: 121/75  Pulse: 73  Temp: 36.6 C  Resp: 20    Last Pain:  Filed Vitals:   10/27/15 1100  PainSc: 8          Complications: No apparent anesthesia complications

## 2015-10-28 ENCOUNTER — Encounter (HOSPITAL_COMMUNITY): Payer: Self-pay | Admitting: Orthopedic Surgery

## 2015-10-28 DIAGNOSIS — M47812 Spondylosis without myelopathy or radiculopathy, cervical region: Secondary | ICD-10-CM | POA: Diagnosis not present

## 2015-10-28 MED ORDER — CHLORHEXIDINE GLUCONATE CLOTH 2 % EX PADS
6.0000 | MEDICATED_PAD | Freq: Every day | CUTANEOUS | Status: DC
  Administered 2015-10-28: 6 via TOPICAL

## 2015-10-28 MED FILL — Thrombin For Soln 20000 Unit: CUTANEOUS | Qty: 1 | Status: AC

## 2015-10-28 NOTE — Op Note (Deleted)
NAMESYLIVA, ROSSON NO.:  [id number]  MEDICAL RECORD NO.:  [id number]  LOCATION:  3C11C                        FACILITY:  [facility]  PHYSICIAN:  Phylliss Bob, MD      DATE OF BIRTH:  [birth year]  DATE OF PROCEDURE:  10/27/2015 DATE OF DISCHARGE:                              OPERATIVE REPORT   PREOPERATIVE DIAGNOSIS:  Symptomatic cervical facet arthrosis.  POSTOPERATIVE DIAGNOSIS:  Symptomatic cervical facet arthrosis.  PROCEDURE: 1. Posterior spinal fusion; C2-3, C3-4, C4-5. 2. Placement of posterior instrumentation, C2 to C5. 3. Use of morselized allograft-DBX. 4. Cranial tong application and removal. 5. Exploration of spinal fusion, C3-C5.  SURGEON:  Phylliss Bob, MD  ASSISTANT:  Pricilla Holm, PA-C.  ANESTHESIA:  General endotracheal anesthesia.  COMPLICATIONS:  None.  DISPOSITION:  Stable.  ESTIMATED BLOOD LOSS:  Minimal.  INDICATIONS FOR SURGERY:  Briefly, Ms. Lester Yell is a 50 year old female, who did present to me with symptomatic facet arthrosis.  She did get excellent temporary relief with facet injections and medial branch blocks.  Her insurance company did not approve proceeding with a radiofrequency ablation procedure, so we did discuss proceeding with the procedure noted above.  The patient was fully aware of the risks and limitations of the procedure, including the possibility of lack of resolution of her pain.  She did elect to proceed.  OPERATIVE DETAILS:  On October 27, 2015, the patient was brought to surgery and general endotracheal anesthesia was administered.  A [place] head holder was applied.  The patient was rolled prone.  The head was positioned into the appropriate degree of lordosis and extension.  The arms were secured to her sides and all bony prominences were padded. The neck was prepped and draped and a time-out procedure was performed. A midline incision was made.  The fascia was incised at the midline. The lamina from  L2-L5 was identified and subperiosteally exposed bilaterally.  Using anatomic landmarks, I did cannulate the C2 level using lateral mass screws.  I did this by using 1.7 mm bur followed by a 3 mm tap, followed by 3.5 mm tap.  I did use a ball-tipped probe to confirm that there was no cortical violation of the lateral mass holes. Then, using a medial to lateral and inferior to superior trajectory, I did cannulate the L4 and L5 lateral masses.  This was again done using a high-speed bur, followed by a 3 mm tap.  I then used a high-speed bur to decorticate the facet joints at C2-3, C3-4, and C4-5 as well as the posterior elements from C2-C5.  Of note, I did explore the fusion at C3- 4 and C4-5 using Kocher's and using a pushing and pulling maneuver. There was minimal micro motion noted at C3-4, with no significant motion identified at C4-5.  The wound was copiously irrigated with approximately 1 L of normal saline.  I then placed 3.5 x 14 mm screws bilaterally at C2 and 3.5 x 12 mm screws bilaterally at C4 and C5.  Rods were then cut to the appropriate length and contoured into the appropriate degree of lordosis.  The rods were then secured into the tulip heads of the screws bilaterally.  Caps were placed and a final locking procedure was performed.  I was very pleased with the final intraoperative radiographs.  The wound was then closed in layers using #1 Vicryl followed by 2-0 Vicryl, followed by 3-0 Monocryl.  Benzoin and Steri-Strips were applied followed by sterile dressing.  The patient was then rolled into the supine position, and the [place] head holder was removed.  All instrument counts were correct.  Of note, there was noted to be significant abnormal motion across the C2- 3 intervertebral space, which was consistent with the patient's preoperative symptoms.  Of note, Pricilla Holm was my assistant throughout surgery and did aid in retraction, suctioning, and closure from  start to finish.     Phylliss Bob, MD     MD/MEDQ  D:  10/27/2015  T:  10/28/2015  Job:  [id number]

## 2015-10-28 NOTE — Progress Notes (Signed)
    Patient doing well, reports expected PO neck pain predominantly in PSM and trapezial musculature. Her px is different than her pre-op px. Has been up and walking and eating    Physical Exam: BP 127/73 mmHg  Pulse 102  Temp(Src) 98.1 F (36.7 C) (Oral)  Resp 17  SpO2 98%  LMP 06/09/2013  Dressing in place, CDI,  hard collar in place and worn appropriately, pt ambulating in room comfortably  NVI  POD #1 s/p C2-5 PSF  - Encourage ambulation - Percocet for pain, Valium for muscle spasms  - Scripts printed and signed in chart - D/C instructions printed and signed in chart -D/c home today

## 2015-10-28 NOTE — Progress Notes (Signed)
Patient alert and oriented, mae's well, voiding adequate amount of urine, swallowing without difficulty, c/o pain and medication given prior to discharged. Patient discharged home with family. Script and discharged instructions given to patient. Patient and family stated understanding of d/c instructions given and has an appointment with MD.  

## 2015-10-28 NOTE — Op Note (Addendum)
Stephanie Gregory, SIGUR NO.:  [id number]  MEDICAL RECORD NO.:  [id number]  LOCATION:  3C11C                        FACILITY:  [facility]  PHYSICIAN:  Phylliss Bob, MD      DATE OF BIRTH:  1965-06-22  DATE OF PROCEDURE:  10/27/2015                              OPERATIVE REPORT   PREOPERATIVE DIAGNOSIS: 1. Cervical facet arthrosis resulting in significant neck pain. 2. Status post previous anterior cervical fusion.  POSTOPERATIVE DIAGNOSES: 1. Cervical facet arthrosis resulting in significant neck pain. 2. Status post previous anterior cervical fusion.  PROCEDURE: 1. Posterior spinal fusion; C2-3, C3-4, C4-5. 2. Placement of posterior segmental instrumentation, C2 to C5. 3. Use of morselized allograft-DBX mix. 4. Cranial tong application and removal. 5. Exploration of spinal fusion, C3 to C5.  SURGEON:  Phylliss Bob, MD  ASSISTANT:  Pricilla Holm, PA-C.  ANESTHESIA:  General endotracheal anesthesia.  COMPLICATIONS:  None.  DISPOSITION:  Stable.  ESTIMATED BLOOD LOSS:  Minimal.  INDICATIONS FOR SURGERY:  Briefly, Stephanie Gregory is a 50 year old female, who did present to me with severe pain at the left side of her neck.  Her pain was readily alleviated with left-sided C2-3 facet injections.  She also did get excellent relief with medial branch blocks.  There was discussion with the previous position regarding proceeding with a radiofrequency ablation procedure.  However, this was not approved by her insurance company.  She did continue to have debilitating pain.  As discussed with the patient, the only other available option to help address her pain was to extend her fusion up to the C2 level, thereby eliminating the arthritic motion across the C2-3 facet joint, with the goal of addressing her ongoing pain.  The patient was fully aware that surgery did not guarantee resolution of her pain.  She did elect to proceed.  PROCEDURE IN DETAIL:  On October 27, 2015, the  patient was brought to surgery and general endotracheal anesthesia was administered.  The patient was placed prone.  General endotracheal anesthesia was administered.  A [place]field head holder was then applied.  The patient was then rolled prone onto a hospital bed with a Mayfield head holder attachment.  The patient's neck was appropriately positioned.  The patient's arms were secured to her sides.  All bony prominences were meticulously padded.  The neck was prepped and draped and a time-out procedure was performed.  A midline incision was then made.  The lamina of C2, C3, C4, and C5 was identified after making a midline fascial incision and retracting the cervical paraspinal musculature.  The lamina from C2 to C5 was subperiosteally exposed, as were the facet joints at C2-3, L3-4 as well as C4-5.  Of note, I did explore the fusion at C3-4 and C4-5.  Specifically, I did use Kocher's to grab the spinous processes at each level and did perform a pushing and pulling maneuver. There was very minimal micro motion identified at the C3-4 intervertebral space and no significant motion was identified across the C4-5 intervertebral space.  Then, using anatomic landmarks, I did cannulate the lateral masses of C4 and C5 using a medial to lateral and inferior to superior trajectory.  I did use  a 3 mm tap and a ball-tipped probe to confirm that there was no cortical violation of the screws. Then, again, using anatomic landmarks, and after palpating the superior and medial aspect of the C2 lamina, I did use a 1.7 mm high-speed bur to prepare the entry point of the C2 screw.  A 3.3 mm tap, followed by 3.5 mm tap was used to prepare the screw hole for the C2 screws bilaterally. A ball-tip probe was used to confirm that there was no cortical violation.  At this point, I did use a high-speed bur to decorticate the facet joints at C2-3, C3-4 as well as C4-5.  Of particular note, there was significant  increased motion noted across the C2-3 intervertebral space.  At this point, the DBX Putty was introduced into the facet joints at C2-3, C3-4, and C4-5 bilaterally.  I then placed 3.5 x 14 mm screws bilaterally at C2, and 3.5 x 12 mm screws bilaterally at C4 and C5.  Rods were then cut to the appropriate length and contoured into the appropriate degree of lordosis.  The rods were then secured into the tulip heads of the screws bilaterally.  Caps were placed and a final locking procedure was performed.  I was very pleased with the final intraoperative x-ray.  Of note, the wound was copiously irrigated with approximately 1.5 mL of normal saline throughout the procedure.  All bleeding was controlled at the termination of the procedure.  The fascia was then closed using #1 Vicryl.  The subcutaneous layer was closed using 2-0 Vicryl, followed by 3-0 Monocryl.  Benzoin and Steri-Strips were applied, followed by sterile dressing.  All instrument counts were correct at the termination of the procedure.  Of note, after the procedure, the patient was placed into the supine position, and the Mayfield head holder was then removed.  Of note, Pricilla Holm was my assistant throughout surgery and did aid in retraction, suctioning, and closure throughout the surgery.     Phylliss Bob, MD     MD/MEDQ  D:  10/27/2015  T:  10/28/2015  Job:  [id number]

## 2015-10-31 ENCOUNTER — Other Ambulatory Visit: Payer: Self-pay | Admitting: Sports Medicine

## 2015-11-16 NOTE — Discharge Summary (Signed)
Patient ID: Stephanie Gregory MRN: [id number] DOB/AGE: 1965-10-20 50 y.o.  Admit date: 10/27/2015 Discharge date: 10/28/2015  Admission Diagnoses:  Active Problems:   Facet arthritis of cervical region [facility])   Discharge Diagnoses:  Same  Past Medical History:  Diagnosis Date  . Anxiety   . Bunion of left foot   . Cancer ([place])    anal  . DDD (degenerative disc disease)    has metal plate C4-C5 diskectomy  . Depression   . GERD (gastroesophageal reflux disease)   . H/O seasonal allergies    pollen and mold  . Mental disorder    Depression / Anxiety  . Migraine   . Numbness and tingling    left 5th finger and ring finger radiates downward from neck area   . Panic attacks   . Shaking    with anxiety   . Shortness of breath dyspnea    walking distances or climbing stairs none now since quit smoking (1 month)  . SVD (spontaneous vaginal delivery)    x 3  . Vaginal Pap smear, abnormal    CIN I - History    Surgeries: Procedure(s): POSTERIOR SPINAL FUSION, CERVICAL 2-3, CERVICAL 3-4, CERVICAL 4-5 WITH INSTRUMENTATION AND ALLOGRAFT on 10/27/2015   Consultants: None  Discharged Condition: Improved  Hospital Course: Stephanie Gregory is an 50 y.o. female who was admitted 10/27/2015 for operative treatment of facet hypertrophy. Patient has severe unremitting pain that affects sleep, daily activities, and work/hobbies. After pre-op clearance the patient was taken to the operating room on 10/27/2015 and underwent  Procedure(s): POSTERIOR SPINAL FUSION, CERVICAL 2-3, CERVICAL 3-4, CERVICAL 4-5 WITH INSTRUMENTATION AND ALLOGRAFT.    Patient was given perioperative antibiotics:  Anti-infectives    Start     Dose/Rate Route Frequency Ordered Stop   10/27/15 1400  ceFAZolin (ANCEF) IVPB 1 g/50 mL premix     1 g 100 mL/hr over 30 Minutes Intravenous Every 8 hours 10/27/15 1224 10/27/15 2151   10/27/15 0606  ceFAZolin (ANCEF) 2-4 GM/100ML-% IVPB    Comments:  Ara Kussmaul   :  cabinet override      10/27/15 0606 10/27/15 1814   10/27/15 0601  ceFAZolin (ANCEF) IVPB 2g/100 mL premix     2 g 200 mL/hr over 30 Minutes Intravenous On call to O.R. 10/27/15 0601 10/27/15 0800       Patient was given sequential compression devices, early ambulation to prevent DVT.  Patient benefited maximally from hospital stay and there were no complications.    Recent vital signs: BP 134/79 (BP Location: Right Arm)   Pulse 92   Temp 97.7 F (36.5 C) (Oral)   Resp 18   LMP 06/09/2013   SpO2 97%   Discharge Medications:     Medication List    STOP taking these medications   tiZANidine 4 MG tablet Commonly known as:  ZANAFLEX     TAKE these medications   ALPRAZolam 0.5 MG tablet Commonly known as:  XANAX Take 0.5 mg by mouth 3 (three) times daily.   atenolol 25 MG tablet Commonly known as:  TENORMIN Take 1 tablet (25 mg total) by mouth daily.   busPIRone 5 MG tablet Commonly known as:  BUSPAR TAKE ONE (1) TABLET BY MOUTH 3 TIMES DAILY   dextromethorphan 30 MG/5ML liquid Commonly known as:  DELSYM Take 5 mLs (30 mg total) by mouth 2 (two) times daily.   docusate sodium 100 MG capsule Commonly known as:  COLACE Take 100  mg by mouth daily.   estradiol 1 MG tablet Commonly known as:  ESTRACE TAKE 1 TABLET BY MOUTH DAILY   fluticasone 50 MCG/ACT nasal spray Commonly known as:  FLONASE One spray in each nostril twice a day, use left hand for right nostril, and right hand for left nostril.   gabapentin 300 MG capsule Commonly known as:  NEURONTIN One tab PO qHS for a week, then BID for a week, then TID. May double weekly to a max of 3,600mg /day What changed:  how much to take  how to take this  when to take this  additional instructions   hydrOXYzine 50 MG capsule Commonly known as:  VISTARIL TAKE ONE CAPSULE BY MOUTH EVERY 6 HOURS   lidocaine 5 % Commonly known as:  LIDODERM Place 1 patch onto the skin every 12 (twelve) hours. Remove & Discard  patch within 12 hours or as directed by MD   LINZESS 72 MCG capsule Generic drug:  linaclotide Take 72 mcg by mouth daily before breakfast.   magnesium oxide 400 MG tablet Commonly known as:  MAG-OX Take 2 tablets (800 mg total) by mouth at bedtime.   montelukast 10 MG tablet Commonly known as:  SINGULAIR Take 1 tablet (10 mg total) by mouth at bedtime.   MOVANTIK 25 MG Tabs tablet Generic drug:  naloxegol oxalate Take 25 mg by mouth every morning.   nicotine 21 mg/24hr patch Commonly known as:  NICODERM CQ 1 patch daily for 6 weeks then 14 mg daily for 2 weeks then 7 mg daily for 2 weeks then stop What changed:  how much to take  how to take this  when to take this  additional instructions   polycarbophil 625 MG tablet Commonly known as:  FIBERCON Take 625 mg by mouth daily.   PROCTOZONE-HC 2.5 % rectal cream Generic drug:  hydrocortisone Place 1 application rectally daily as needed for hemorrhoids or itching.   promethazine 12.5 MG tablet Commonly known as:  PHENERGAN TAKE ONE TABLET BY MOUTH EVERY 4 HOURS AS NEEDED FOR NAUSEA/VOMITING   rizatriptan 10 MG tablet Commonly known as:  MAXALT Take 10 mg by mouth daily as needed. Migraines   rOPINIRole 1 MG tablet Commonly known as:  REQUIP Take 1 tablet (1 mg total) by mouth at bedtime.   SUMAtriptan 50 MG tablet Commonly known as:  IMITREX Take 1 tablet (50 mg total) by mouth every 2 (two) hours as needed for migraine. May repeat in 2 hours if headache persists or recurs.   topiramate 200 MG tablet Commonly known as:  TOPAMAX Take 1 tablet (200 mg total) by mouth daily.   tretinoin 0.05 % cream Commonly known as:  RETIN-A Apply 1 application topically at bedtime.   venlafaxine XR 75 MG 24 hr capsule Commonly known as:  EFFEXOR-XR TAKE 1 CAPSULE BY MOUTH DAILY WITH 150MG DOSE What changed:  See the new instructions.       Diagnostic Studies: Dg Cervical Spine 1 View  Result Date:  10/27/2015 CLINICAL DATA:  Postop check. Generalized neck pain after posterior fusion today. EXAM: CERVICAL SPINE 1 VIEW COMPARISON:  CT 10/18/2015 FINDINGS: Single AP view demonstrates evidence of patient's anterior fusion hardware intact at the C3-4 and C5-C7 levels. There is new posterior fusion hardware from approximately C2 to C5 intact. IMPRESSION: Anterior posterior fusion hardware as described intact. Electronically Signed   By: Marin Olp M.D.   On: 10/27/2015 17:17   Dg Cervical Spine 1 View  Result Date: 10/27/2015  CLINICAL DATA:  Cervical fusion. EXAM: CERVICAL SPINE 1 VIEW COMPARISON:  10/27/2015.  CT 10/18/2015. FINDINGS: Surgical staples and spines noted along the posterior neck. Posterior fusion C2 through C5. Hardware intact. Prior C3-C4 anterior interbody fusion. Prior C5 through C7 anterior interbody fusion. Mild moderate retrolisthesis C3 on C4 noted. IMPRESSION: Postsurgical changes cervical spine with posterior fusion C2 through C5. Mild moderate retrolisthesis C3 on C4 noted. Electronically Signed   By: Marcello Moores  Register   On: 10/27/2015 11:59   Dg Cervical Spine 1 View  Result Date: 10/27/2015 CLINICAL DATA:  C2-3, C3-4 and C4-5 posterior fusion. EXAM: CERVICAL SPINE 1 VIEW COMPARISON:  Cervical spine CT dated 10/18/2015. FINDINGS: A metallic localizer is demonstrated with its tip projected over the facets at the C2-3 level. A second localizer tip is projected over the facets at the inferior C3 level. Again demonstrated is anterior hardware fusion at the C3-4 and C5-6 levels with an solid anterior bone fusion at the C4-5 level. IMPRESSION: Localizers at the C2-3 and C3 levels. Electronically Signed   By: Claudie Revering M.D.   On: 10/27/2015 09:13   Ct Cervical Spine Wo Contrast  Result Date: 10/18/2015 CLINICAL DATA:  Cervicalgia.  Cervical fusion EXAM: CT CERVICAL SPINE WITHOUT CONTRAST TECHNIQUE: Multidetector CT imaging of the cervical spine was performed without intravenous  contrast. Multiplanar CT image reconstructions were also generated. COMPARISON:  Operative cervical radiographs 06/09/2014 and 02/03/2014. No prior CT or MRI. FINDINGS: Slight retrolisthesis at C3-4. Remaining alignment normal. Negative for fracture or mass lesion. Cervical fusion C3 through C7 as described below. Cervical spine soft tissues are normal.  Lung apices are clear. C2-3:  Negative C3-4: ACDF with solid interbody fusion. Mild uncinate spurring at with mild right foraminal narrowing. C4-5: Solid interbody fusion. No significant spinal or foraminal stenosis. C5-6: ACDF with anterior plate and interbody bone graft. Solid fusion. Minimal foraminal stenosis bilaterally. No significant spinal stenosis. C6-7: ACDF. Anterior plate and interbody bone graft and spacer. Solid fusion. Mild uncinate spurring with mild foraminal narrowing bilaterally. C7-T1:  Negative IMPRESSION: Solid fusion C3 through C7. Residual uncinate spurring is present at C3-4 and C5-6 and C6-7 causing mild foraminal narrowing. No significant spinal stenosis. Electronically Signed   By: Franchot Gallo M.D.   On: 10/18/2015 15:10   Mr Shoulder Left Wo Contrast  Result Date: 10/18/2015 CLINICAL DATA:  Chronic left shoulder pain. Numbness and weakness in the left arm. EXAM: MRI OF THE LEFT SHOULDER WITHOUT CONTRAST TECHNIQUE: Multiplanar, multisequence MR imaging of the shoulder was performed. No intravenous contrast was administered. COMPARISON:  08/15/2015 FINDINGS: Rotator cuff: Significant rotator cuff tendinopathy/tendinosis. There is an oblique coursing articular surface tear extending to the bursal surface at the infraspinatus supraspinatus junction region (Series 6, image number 6). It could be a small perforation involving the bursal surface making this a small full-thickness tear. Significant surrounding tendinopathy. There is also a small stable tear at the anterior footprint attachment region of the supraspinous tendon (rim rent  type tear). The subscapularis tendon is intact. Muscles: Normal except for small amount of fluid tracking back along the infraspinatus tendon into musculotendinous junction. Biceps long head:  Intact Acromioclavicular Joint: Mild degenerative changes. The acromion is type 2 in shape. No significant lateral downsloping or undersurface spurring. Glenohumeral Joint: Minimal degenerative changes. No joint effusion or synovitis. Labrum:  No labral tear. Bones:  No acute bony findings. Other: Mild subacromial/subdeltoid bursitis. IMPRESSION: 1. Significant persistent rotator cuff tendinopathy/tendinosis. Oblique coursing tear at the infraspinatus supraspinatus junction region  which is contacting the articular surface and extending to the bursal surface. There could be a small full-thickness perforation. There is also associated fluid tracking back along the infraspinatus tendon and into the muscle. 2. Intact long head biceps tendon and glenoid labrum. 3. No significant findings for bony impingement. 4. Mild subacromial/subdeltoid bursitis. Electronically Signed   By: Marijo Sanes M.D.   On: 10/18/2015 15:55    Disposition: 01-Home or Self Care   POD #1 s/p C2-5 PSF  - Encourage ambulation - Percocet for pain, Valium for muscle spasms                       - Scripts printed and signed in chart - D/C instructions printed and signed in chart  -D/C today  -F/U in office 2 weeks   Signed: Justice Britain 11/16/2015, 11:42 AM

## 2015-11-28 ENCOUNTER — Ambulatory Visit: Payer: Self-pay | Admitting: Neurology

## 2015-12-19 ENCOUNTER — Ambulatory Visit: Payer: Self-pay | Admitting: Neurology

## 2016-01-24 ENCOUNTER — Other Ambulatory Visit: Payer: Self-pay | Admitting: Physical Medicine and Rehabilitation

## 2016-01-24 DIAGNOSIS — G8929 Other chronic pain: Secondary | ICD-10-CM

## 2016-01-24 DIAGNOSIS — M545 Low back pain: Principal | ICD-10-CM

## 2016-01-30 ENCOUNTER — Other Ambulatory Visit: Payer: Self-pay | Admitting: Sports Medicine

## 2016-01-30 ENCOUNTER — Ambulatory Visit: Payer: Self-pay | Admitting: Neurology

## 2016-02-11 ENCOUNTER — Other Ambulatory Visit: Payer: Self-pay

## 2016-02-29 ENCOUNTER — Other Ambulatory Visit: Payer: Self-pay

## 2016-04-13 ENCOUNTER — Other Ambulatory Visit: Payer: Self-pay

## 2016-04-20 ENCOUNTER — Other Ambulatory Visit: Payer: Self-pay

## 2016-05-08 ENCOUNTER — Other Ambulatory Visit: Payer: Self-pay | Admitting: Sports Medicine

## 2016-05-17 ENCOUNTER — Other Ambulatory Visit: Payer: Self-pay

## 2016-05-28 ENCOUNTER — Other Ambulatory Visit: Payer: Self-pay | Admitting: Nurse Practitioner

## 2016-05-28 DIAGNOSIS — M545 Low back pain: Secondary | ICD-10-CM

## 2016-06-03 ENCOUNTER — Other Ambulatory Visit: Payer: Self-pay

## 2016-09-05 ENCOUNTER — Encounter: Payer: Self-pay | Admitting: Obstetrics and Gynecology

## 2016-09-05 ENCOUNTER — Ambulatory Visit (INDEPENDENT_AMBULATORY_CARE_PROVIDER_SITE_OTHER): Payer: BLUE CROSS/BLUE SHIELD | Admitting: Obstetrics and Gynecology

## 2016-09-05 VITALS — BP 122/76 | HR 76 | Resp 16 | Ht 61.75 in | Wt 141.8 lb

## 2016-09-05 DIAGNOSIS — N816 Rectocele: Secondary | ICD-10-CM | POA: Diagnosis not present

## 2016-09-05 DIAGNOSIS — N8110 Cystocele, unspecified: Secondary | ICD-10-CM | POA: Diagnosis not present

## 2016-09-05 DIAGNOSIS — N993 Prolapse of vaginal vault after hysterectomy: Secondary | ICD-10-CM

## 2016-09-05 DIAGNOSIS — N393 Stress incontinence (female) (male): Secondary | ICD-10-CM

## 2016-09-05 NOTE — Progress Notes (Signed)
GYNECOLOGY  VISIT   Ref: Caffie Damme, MD  HPI: 51 y.o.   Single  Caucasian  female   G3P3 with Patient's last menstrual period was 06/09/2013.   here for vaginal vault prolapse.    Husband is present for the entire visit today.  Prolapse for  3 - 4 weeks ago.  Pushing prolapse back and to void.  Leakage with cough, laugh, and sneeze.  Having enuresis.  No leakage for no reason.  Taking Toviaz samples and will start Oxybutinin.  This is helping.   No pain with urination or blood in her urine.   Recent tx for UTI.   No hx stones.   Status post TLH/RSO and status post laparoscopic LSO in 2015 with Dr. Alwyn Pea for endometriosis.  On Estrace now for menopausal symptoms.   Hx anal carcinoma in situ and is status post wide excision on 10/28/14 with Dr. Zella Richer. Final pathology showed negative margins.  Is having rectal bleeding and diarrhea. No radiation or chemotherapy. Has seen multiple providers including Dr. Zella Richer, Dr. Collene Mares, and Dr. Marcello Moores for her current symptoms. States she has been told there is nothing further to do. She is not able to tell me what her current diagnosis is.  She states that no one is fixing it and she is quite distressed about this. She does have an upcoming appointment in June at [facility] for her GI symptoms.   She has chronic pain and takes Roxicodone.   PCP - Dr. Charlott Holler [facility].   GYNECOLOGIC HISTORY: Patient's last menstrual period was 06/09/2013. Contraception:  Tubal/Hysterectomy Menopausal hormone therapy:  Estradiol 49m Last mammogram: 05/2016 normal with [facility] Last pap smear: 05/2016 normal per patient(last in Epic 05-04-13 ASCUS:Neg HR HPV)        OB History    Gravida Para Term Preterm AB Living   _0 SAB TAB Ectopic Multiple Live Births                     Patient Active Problem List   Diagnosis Date Noted  . Facet arthritis of cervical region ([facility] 10/27/2015  . Low back pain  09/08/2015  . Left shoulder pain 07/05/2015  . Opiate addiction ([facility] 02/16/2015  . Sinusitis, chronic 11/22/2014  . Smoker 09/23/2014  . Anal cancer ([facility] 09/23/2014  . Moderate major depression ([facility] 09/23/2014  . Hormone replacement therapy, surgical menopause 09/23/2014  . Annual physical exam 09/23/2014  . Migraine headache without aura 09/23/2014  . Restless leg syndrome 09/23/2014  . Degenerative disc disease, cervical post-ACDF 02/03/2014  . S/P hysterectomy with oophorectomy 09/15/2013    Past Medical History:  Diagnosis Date  . Abnormal Pap smear of cervix    as a teenager--hx colpo/cryotherapy to cervix  . Anxiety   . Bunion of left foot   . Cancer ([facility] 2016   anal--Dr.Rosenbower  . DDD (degenerative disc disease)    has metal plate C4-C5 diskectomy  . Depression   . Endometriosis   . GERD (gastroesophageal reflux disease)   . H/O seasonal allergies    pollen and mold  . Mental disorder    Depression / Anxiety  . Migraine   . Numbness and tingling    left 5th finger and ring finger radiates downward from neck area   . Panic attacks   . Shaking    with anxiety   . Shortness of breath dyspnea  walking distances or climbing stairs none now since quit smoking (1 month)  . SVD (spontaneous vaginal delivery)    x 3  . Vaginal Pap smear, abnormal    CIN I - History    Past Surgical History:  Procedure Laterality Date  . ABDOMINAL HYSTERECTOMY  10/2013   TLH/RSO  . ANTERIOR CERVICAL DECOMP/DISCECTOMY FUSION N/A 02/03/2014   Procedure: ANTERIOR CERVICAL DECOMPRESSION/DISCECTOMY FUSION 2 LEVELS;  Surgeon: Sinclair Ship, MD;  Location: [place];  Service: Orthopedics;  Laterality: N/A;  Anterior cervical decompression fusion, cervical 5-6, cervical 6-7 with instrumentation and allograft  . ANTERIOR CERVICAL DECOMP/DISCECTOMY FUSION N/A 06/09/2014   Procedure: ANTERIOR CERVICAL DECOMPRESSION/DISCECTOMY FUSION 1 LEVEL;  Surgeon: Sinclair Ship, MD;   Location: [place];  Service: Orthopedics;  Laterality: N/A;  Anterior cervical decompression fusion, cervical 3-4 with instrumentation and allograft  . COLON SURGERY  2016   Anal cancer--Dr.Rosenbower  . CYSTOSCOPY N/A 09/15/2013   Procedure: CYSTOSCOPY;  Surgeon: Sanjuana Kava, MD;  Location: [place] [facility];  Service: Gynecology;  Laterality: N/A;  . DILATATION & CURRETTAGE/HYSTEROSCOPY WITH RESECTOCOPE N/A 06/16/2013   Procedure: DILATATION & CURETTAGE/HYSTEROSCOPY WITH RESECTOCOPE/CERVICAL POLYPECTOMY;  Surgeon: Sanjuana Kava, MD;  Location: [place] [facility];  Service: Gynecology;  Laterality: N/A;  . DILATION AND CURETTAGE OF UTERUS    . EXAMINATION UNDER ANESTHESIA N/A 10/28/2014   Procedure: EXAM UNDER ANESTHESIA, REMOVAL OF SQUAMOUS CELL CARCINOMA IN SITU OF THE ANUS;  Surgeon: Jackolyn Confer, MD;  Location: [place];  Service: General;  Laterality: N/A;  . HARDWARE REMOVAL N/A 02/03/2014   Procedure: HARDWARE REMOVAL;  Surgeon: Sinclair Ship, MD;  Location: [place];  Service: Orthopedics;  Laterality: N/A;  removal of hardware cervical 4-5.  Marland Kitchen LAPAROSCOPIC ASSISTED VAGINAL HYSTERECTOMY Right 09/15/2013   Procedure: LAPAROSCOPIC ASSISTED VAGINAL HYSTERECTOMY with right salpingo-oophorectomy;  Surgeon: Sanjuana Kava, MD;  Location: [place] [facility];  Service: Gynecology;  Laterality: Right;  . LAPAROSCOPY N/A 06/16/2013   Procedure: LAPAROSCOPY OPERATIVE SALPINGO OOPHORECTOMY;  Surgeon: Sanjuana Kava, MD;  Location: [place] [facility];  Service: Gynecology;  Laterality: N/A;  . NASAL SINUS SURGERY  2000  . NECK SURGERY  12/1999   C4-C5 diskectomy  . POSTERIOR CERVICAL FUSION/FORAMINOTOMY N/A 10/27/2015   Procedure: POSTERIOR SPINAL FUSION, CERVICAL 2-3, CERVICAL 3-4, CERVICAL 4-5 WITH INSTRUMENTATION AND ALLOGRAFT;  Surgeon: Phylliss Bob, MD;  Location: [place];  Service: Orthopedics;  Laterality: N/A;  POSTERIOR SPINAL FUSION, CERVICAL 2-3, CERVICAL 3-4, CERVICAL 4-5 WITH INSTRUMENTATION AND ALLOGRAFT  . ROTATOR CUFF REPAIR Left   . TUBAL  LIGATION  10/2000  . WISDOM TOOTH EXTRACTION      Current Outpatient Prescriptions  Medication Sig Dispense Refill  . ALPRAZolam (XANAX) 0.5 MG tablet Take 0.5 mg by mouth 3 (three) times daily.    Marland Kitchen ALPRAZolam (XANAX) 1 MG tablet Take 1 tablet by mouth 3 (three) times daily.    Marland Kitchen docusate sodium (COLACE) 100 MG capsule Take 200 mg by mouth daily.     Marland Kitchen HORIZANT 300 MG TBCR Take 1 tablet by mouth 2 (two) times daily.  0  . montelukast (SINGULAIR) 10 MG tablet Take 1 tablet (10 mg total) by mouth at bedtime. 90 tablet 3  . Naldemedine Tosylate (SYMPROIC) 0.2 MG TABS Take 1 tablet by mouth daily.    Marland Kitchen NARCAN 4 MG/0.1ML LIQD nasal spray kit   0  . ondansetron (ZOFRAN) 8 MG tablet Take 1 tablet by mouth as needed.    Marland Kitchen oxybutynin (DITROPAN-XL) 5 MG 24 hr tablet Take 1 tablet  by mouth daily.  0  . oxyCODONE (ROXICODONE) 15 MG immediate release tablet Take 1 tablet by mouth 4 (four) times daily.    . polycarbophil (FIBERCON) 625 MG tablet Take 625 mg by mouth daily.     Marland Kitchen PROCTOZONE-HC 2.5 % rectal cream Place 1 application rectally daily as needed for hemorrhoids or itching.   0  . rOPINIRole (REQUIP) 1 MG tablet Take 1 tablet (1 mg total) by mouth at bedtime. 90 tablet 3  . silver sulfADIAZINE (SILVADENE) 1 % cream Apply 1 application topically 2 (two) times daily.    . SUMAtriptan (IMITREX) 50 MG tablet Take 1 tablet (50 mg total) by mouth every 2 (two) hours as needed for migraine. May repeat in 2 hours if headache persists or recurs. 10 tablet 11  . SUMAtriptan 6 MG/0.5ML SOAJ Inject 1 Dose into the muscle as needed.    . TiZANidine HCl (ZANAFLEX PO) Take 1 tablet by mouth 2 (two) times daily.    Marland Kitchen topiramate (TOPAMAX) 200 MG tablet Take 1 tablet (200 mg total) by mouth daily. 30 tablet 11  . traZODone (DESYREL) 150 MG tablet TAKE TWO TABLETS BY MOUTH EVERY NIGHT AT BEDTIME 180 tablet 3  . triamcinolone cream (KENALOG) 0.5 % triamcinolone acetonide 0.5 % topical cream    . venlafaxine  (EFFEXOR) 75 MG tablet Take 1 tablet by mouth daily.    Marland Kitchen venlafaxine XR (EFFEXOR XR) 150 MG 24 hr capsule Take 1 tablet by mouth daily.    Marland Kitchen venlafaxine XR (EFFEXOR-XR) 75 MG 24 hr capsule TAKE 1 CAPSULE BY MOUTH DAILY WITH 150MGDOSE (Patient taking differently: TAKE 1 CAPSULE BY MOUTH DAILY WITH 150MGDOSE plus 75 mg to equal 226m) 90 capsule 0  . XTAMPZA ER 9 MG C12A Take 1 tablet by mouth 2 (two) times daily.  0   No current facility-administered medications for this visit.      ALLERGIES: Bactrim [sulfamethoxazole-trimethoprim]; Cyclobenzaprine; and Methocarbamol  Family History  Problem Relation Age of Onset  . Diabetes Sister     Social History   Social History  . Marital status: Single    Spouse name: N/A  . Number of children: N/A  . Years of education: N/A   Occupational History  . Not on file.   Social History Main Topics  . Smoking status: Current Every Day Smoker    Packs/day: 1.00    Years: 31.00    Types: Cigarettes  . Smokeless tobacco: Never Used  . Alcohol use No     Comment: past hx of occas use 6-7 yrs per pt  . Drug use: No     Comment: once or twice weekly, but daily use this past weekend-stated stopped 6 weeks ago  . Sexual activity: Yes    Partners: Male    Birth control/ protection: Surgical     Comment: TLH/RSO 10/2013--06/2013 LSO   Other Topics Concern  . Not on file   Social History Narrative  . No narrative on file    ROS:  Pertinent items are noted in HPI.  PHYSICAL EXAMINATION:    BP 122/76 (BP Location: Right Arm, Patient Position: Sitting, Cuff Size: Normal)   Pulse 76   Resp 16   Ht 5' 1.75" (1.568 m)   Wt 141 lb 12.8 oz (64.3 kg)   LMP 06/09/2013   BMI 26.15 kg/m     General appearance: alert, cooperative and appears stated age Head: Normocephalic, without obvious abnormality, atraumatic Neck: no adenopathy, supple, symmetrical, trachea midline and  thyroid normal to inspection and palpation Lungs: clear to auscultation  bilaterally Breasts: normal appearance, no masses or tenderness, No nipple retraction or dimpling, No nipple discharge or bleeding, No axillary or supraclavicular adenopathy Heart: regular rate and rhythm Abdomen: soft, non-tender, no masses,  no organomegaly Extremities: extremities normal, atraumatic, no cyanosis or edema Skin: Skin color, texture, turgor normal. No rashes or lesions Lymph nodes: Cervical, supraclavicular, and axillary nodes normal. No abnormal inguinal nodes palpated Neurologic: Grossly normal  Pelvic: External genitalia:  no lesions              Urethra:  normal appearing urethra with no masses, tenderness or lesions              Bartholins and Skenes: normal                 Vagina: normal appearing vagina with normal color and discharge, no lesions.  Second degree cystocele and rectocele.              Cervix: absent.  First degree vault prolapse.                Bimanual Exam:  Uterus:  Absent.               Adnexa: no mass, fullness, tenderness              Rectal exam: Yes.  .  Confirms.              Anus:  normal sphincter tone, no lesions  Chaperone was present for exam.  ASSESSMENT  Vaginal vault prolapse.  Mixed incontinence.  Hx anal carcinoma in situ.  Status post wide excision. Rectal bleeding and diarrhea.  May be taking too much Miralax. On chronic pain medication.  Hx cervical spine surgery.  PLAN  We discussed prolapse and incontinence today and options for care including pessary use and surgical intervention.  Vault suspension can be performed vaginally, laparoscopically, or abdominally, and recurrence rates of prolapse with each approach were reviewed. We focused on a sacrocolpopexy with anterior and posterior colporrhaphy and possible midurethral sling with cystoscopy.  She understands that the sacrocolpopexy can be performed laparoscopically with DaVinci robot or through laparotomy incision.  I shared with her that I have just completed a  surgical course for the robotic approach.  Return for urodynamics with prolapse reduction with pessary. Get records from Dr. Zella Richer, Dr. Collene Mares, and Dr. Marcello Moores. She needs her rectal bleeding clearly addressed prior to any future GYN surgical care. I encouraged her to keep her appointment with [facility] for her GI consultation.  I also did tell her she is welcome to another urogynecology opinion and gave her Dr. Rosalene Billings name. ACOG handouts to patient today.  She is going to need some general blood work as well in preparation for surgery.  My chart review after her visit indicate various minor alterations in her Cr, glucose level, and LFTs.   An After Visit Summary was printed and given to the patient.  __45____ minutes face to face time of which over 50% was spent in counseling.

## 2016-09-07 ENCOUNTER — Telehealth: Payer: Self-pay | Admitting: Obstetrics and Gynecology

## 2016-09-07 NOTE — Telephone Encounter (Signed)
Spoke with patient regarding benefits for Urodynamic testing. Patient understood and agreeable. Patient aware she will receive a call for scheduling. Will route to [facility] for scheduling.  Patient asked if outside records were received as requested. Will route to medical records for status.

## 2016-09-09 ENCOUNTER — Encounter: Payer: Self-pay | Admitting: Obstetrics and Gynecology

## 2016-09-10 NOTE — Telephone Encounter (Signed)
Stephanie Gregory with Medical records spoke with patient and noted records received.   Ok to close encounter.

## 2016-09-13 ENCOUNTER — Ambulatory Visit: Payer: BLUE CROSS/BLUE SHIELD | Admitting: Neurology

## 2016-09-13 ENCOUNTER — Telehealth: Payer: Self-pay | Admitting: *Deleted

## 2016-09-13 NOTE — Telephone Encounter (Addendum)
Canceled 10am new pt appt at 8:07am due to car problems.

## 2016-09-17 ENCOUNTER — Telehealth: Payer: Self-pay | Admitting: Obstetrics and Gynecology

## 2016-09-17 NOTE — Telephone Encounter (Signed)
Return call to patient.

## 2016-09-17 NOTE — Telephone Encounter (Addendum)
Spoke with patient regarding Urodynamic instructions. Advised patient to stop Oxybutynin 1 week prior to procedure. Patient has a PCP appointment scheduled for Friday, September 21, 2016 and will ask to have a urinalysis performed there and have the results faxed to [place]. Asked patient to have a urine micro and culture performed if urinalysis was abnormal. Patient verbalized agreement and understanding. Letter mailed to patient with instructions as well.

## 2016-09-17 NOTE — Telephone Encounter (Signed)
Patient called to check on status on urodynamics appointment. She states she is having a lot of lower bladder pain and would like a call back from the nurse.

## 2016-09-25 ENCOUNTER — Telehealth: Payer: Self-pay | Admitting: *Deleted

## 2016-09-25 NOTE — Telephone Encounter (Signed)
Call to patient. Advised Dr Quincy Simmonds has reviewed UA from PCP as well as triage call from last week. Recommend office visit for reassessment before proceeding with urodynamics. Patient states back pain has resolved and no longer has bladder or pelvic pain. Had chest pain and SOB at PCP yesterday and EKG was normal. ECHO was ordered but not yet scheduled. Appointment scheduled with Dr Quincy Simmonds for tomorrow 09-26-16 for reassessment prior to proceeding with urodynamics.  Routing to provider for final review. Patient agreeable to disposition. Will close encounter.

## 2016-09-26 ENCOUNTER — Encounter: Payer: Self-pay | Admitting: Obstetrics and Gynecology

## 2016-09-26 ENCOUNTER — Ambulatory Visit (INDEPENDENT_AMBULATORY_CARE_PROVIDER_SITE_OTHER): Payer: BLUE CROSS/BLUE SHIELD | Admitting: Obstetrics and Gynecology

## 2016-09-26 ENCOUNTER — Ambulatory Visit: Payer: BLUE CROSS/BLUE SHIELD

## 2016-09-26 VITALS — BP 120/60 | HR 80 | Resp 16 | Ht 61.75 in | Wt 141.0 lb

## 2016-09-26 DIAGNOSIS — R3129 Other microscopic hematuria: Secondary | ICD-10-CM | POA: Diagnosis not present

## 2016-09-26 LAB — POCT URINALYSIS DIPSTICK
Bilirubin, UA: NEGATIVE
Blood, UA: NEGATIVE
Glucose, UA: NEGATIVE
LEUKOCYTES UA: NEGATIVE
NITRITE UA: NEGATIVE
PH UA: 6 (ref 5.0–8.0)
PROTEIN UA: NEGATIVE
Urobilinogen, UA: 0.2 E.U./dL

## 2016-09-26 NOTE — Patient Instructions (Signed)
We will call you to schedule the urodynamic testing.

## 2016-09-26 NOTE — Progress Notes (Signed)
GYNECOLOGY  VISIT   HPI: 51 y.o.   Single  Caucasian  female   G3P3 with Patient's last menstrual period was 06/09/2013.   here for  F/u of UA.  Husband present today.   Had suprapubic pain and saw PCP.  Had visit with PCP and had trace blood in the urine.   Was scheduled for urodynamics, but when this result was seen yesterday along with her recent pelvic pain, her urodynamic testing was cancelled.  Urine today - mod ketones, other wise negative.   Patient is having enema once a week due to difficulty passing stool.   Hx of fecal incontinence once a monthly.  This has stopped.   Cancelled her appointments at [facility] in GI department for rectal bleeding and diarrhea. Decided she just wants prolapse surgery done.   Has a court case on Friday for social security benefits.  Feeling decreased appetite due to this.  GYNECOLOGIC HISTORY: Patient's last menstrual period was 06/09/2013. Contraception:  hysterectomy Menopausal hormone therapy:  Estradiol 69m Last mammogram:  2018 [facility] Last pap smear:   2/18 normal per patient        OB History    Gravida Para Term Preterm AB Living   3 3       3    SAB TAB Ectopic Multiple Live Births                     Patient Active Problem List   Diagnosis Date Noted  . Facet arthritis of cervical region ([place] 10/27/2015  . Low back pain 09/08/2015  . Left shoulder pain 07/05/2015  . Opiate addiction ([place] 02/16/2015  . Sinusitis, chronic 11/22/2014  . Smoker 09/23/2014  . Anal cancer ([place] 09/23/2014  . Moderate major depression ([place] 09/23/2014  . Hormone replacement therapy, surgical menopause 09/23/2014  . Annual physical exam 09/23/2014  . Migraine headache without aura 09/23/2014  . Restless leg syndrome 09/23/2014  . Degenerative disc disease, cervical post-ACDF 02/03/2014  . S/P hysterectomy with oophorectomy 09/15/2013    Past Medical History:  Diagnosis Date  . Abnormal Pap smear of cervix    as  a teenager--hx colpo/cryotherapy to cervix  . Anxiety   . Bunion of left foot   . Cancer ([place] 2016   anal--Dr.Rosenbower.  Carcinomain in situ.  Biopsy prior to excision is HR HPV positive.  . Chronic pain disorder   . DDD (degenerative disc disease)    has metal plate C4-C5 diskectomy  . Depression   . Endometriosis   . GERD (gastroesophageal reflux disease)   . H/O seasonal allergies    pollen and mold  . Mental disorder    Depression / Anxiety  . Migraine   . Numbness and tingling    left 5th finger and ring finger radiates downward from neck area   . Panic attacks   . Positive result for methicillin resistant Staphylococcus aureus (MRSA) screening 2015, 2016  . Shaking    with anxiety   . Shortness of breath dyspnea    walking distances or climbing stairs none now since quit smoking (1 month)  . SVD (spontaneous vaginal delivery)    x 3  . Vaginal Pap smear, abnormal    CIN I - History    Past Surgical History:  Procedure Laterality Date  . ABDOMINAL HYSTERECTOMY  10/2013   TLH/RSO  . ANTERIOR CERVICAL DECOMP/DISCECTOMY FUSION N/A 02/03/2014   Procedure: ANTERIOR CERVICAL DECOMPRESSION/DISCECTOMY FUSION 2 LEVELS;  Surgeon: MElta Guadeloupe  Suzan Slick, MD;  Location: [place];  Service: Orthopedics;  Laterality: N/A;  Anterior cervical decompression fusion, cervical 5-6, cervical 6-7 with instrumentation and allograft  . ANTERIOR CERVICAL DECOMP/DISCECTOMY FUSION N/A 06/09/2014   Procedure: ANTERIOR CERVICAL DECOMPRESSION/DISCECTOMY FUSION 1 LEVEL;  Surgeon: Sinclair Ship, MD;  Location: [place];  Service: Orthopedics;  Laterality: N/A;  Anterior cervical decompression fusion, cervical 3-4 with instrumentation and allograft  . COLON SURGERY  2016   Anal cancer--Dr.Rosenbower.   Carcinomain situ.  . CYSTOSCOPY N/A 09/15/2013   Procedure: CYSTOSCOPY;  Surgeon: Sanjuana Kava, MD;  Location: [facility];  Service: Gynecology;  Laterality: N/A;  . DILATATION & CURRETTAGE/HYSTEROSCOPY WITH  RESECTOCOPE N/A 06/16/2013   Procedure: DILATATION & CURETTAGE/HYSTEROSCOPY WITH RESECTOCOPE/CERVICAL POLYPECTOMY;  Surgeon: Sanjuana Kava, MD;  Location: [facility];  Service: Gynecology;  Laterality: N/A;  . DILATION AND CURETTAGE OF UTERUS    . EXAMINATION UNDER ANESTHESIA N/A 10/28/2014   Procedure: EXAM UNDER ANESTHESIA, REMOVAL OF SQUAMOUS CELL CARCINOMA IN SITU OF THE ANUS;  Surgeon: Jackolyn Confer, MD;  Location: [facility];  Service: General;  Laterality: N/A;  . HARDWARE REMOVAL N/A 02/03/2014   Procedure: HARDWARE REMOVAL;  Surgeon: Sinclair Ship, MD;  Location: [place];  Service: Orthopedics;  Laterality: N/A;  removal of hardware cervical 4-5.  Marland Kitchen LAPAROSCOPIC ASSISTED VAGINAL HYSTERECTOMY Right 09/15/2013   Procedure: LAPAROSCOPIC ASSISTED VAGINAL HYSTERECTOMY with right salpingo-oophorectomy;  Surgeon: Sanjuana Kava, MD;  Location: [facility];  Service: Gynecology;  Laterality: Right;  . LAPAROSCOPY N/A 06/16/2013   Procedure: LAPAROSCOPY OPERATIVE SALPINGO OOPHORECTOMY;  Surgeon: Sanjuana Kava, MD;  Location: [facility];  Service: Gynecology;  Laterality: N/A;  . NASAL SINUS SURGERY  2000  . NECK SURGERY  12/1999   C4-C5 diskectomy  . POSTERIOR CERVICAL FUSION/FORAMINOTOMY N/A 10/27/2015   Procedure: POSTERIOR SPINAL FUSION, CERVICAL 2-3, CERVICAL 3-4, CERVICAL 4-5 WITH INSTRUMENTATION AND ALLOGRAFT;  Surgeon: Phylliss Bob, MD;  Location: [place];  Service: Orthopedics;  Laterality: N/A;  POSTERIOR SPINAL FUSION, CERVICAL 2-3, CERVICAL 3-4, CERVICAL 4-5 WITH INSTRUMENTATION AND ALLOGRAFT  . ROTATOR CUFF REPAIR Left   . TUBAL LIGATION  10/2000  . WISDOM TOOTH EXTRACTION      Current Outpatient Prescriptions  Medication Sig Dispense Refill  . ALPRAZolam (XANAX) 1 MG tablet Take 1 tablet by mouth 3 (three) times daily.    Marland Kitchen docusate sodium (COLACE) 100 MG capsule Take 200 mg by mouth daily.     Marland Kitchen esomeprazole (NEXIUM) 40 MG capsule as needed.  4  . HORIZANT 600 MG TBCR Take 1 tablet by mouth 2 (two) times  daily.  4  . montelukast (SINGULAIR) 10 MG tablet Take 1 tablet (10 mg total) by mouth at bedtime. 90 tablet 3  . MOVANTIK 25 MG TABS tablet Take 1 tablet by mouth daily.  11  . Naldemedine Tosylate (SYMPROIC) 0.2 MG TABS Take 1 tablet by mouth daily.    Marland Kitchen NARCAN 4 MG/0.1ML LIQD nasal spray kit Place into the nose as needed.   0  . ondansetron (ZOFRAN) 8 MG tablet Take 1 tablet by mouth as needed.    Marland Kitchen oxyCODONE (ROXICODONE) 15 MG immediate release tablet Take 1 tablet by mouth every 4 (four) hours as needed.     . polycarbophil (FIBERCON) 625 MG tablet Take 625 mg by mouth daily.     Marland Kitchen PROCTOZONE-HC 2.5 % rectal cream Place 1 application rectally daily as needed for hemorrhoids or itching.   0  . rOPINIRole (REQUIP) 1 MG tablet Take 1  tablet (1 mg total) by mouth at bedtime. 90 tablet 3  . silver sulfADIAZINE (SILVADENE) 1 % cream Apply 1 application topically 2 (two) times daily.    . SUMAtriptan (IMITREX) 100 MG tablet Take 1 tablet by mouth as needed.  4  . SUMAtriptan 6 MG/0.5ML SOAJ Inject 1 Dose into the muscle as needed.    . TiZANidine HCl (ZANAFLEX PO) Take 1 tablet by mouth 2 (two) times daily.    Marland Kitchen topiramate (TOPAMAX) 200 MG tablet Take 1 tablet (200 mg total) by mouth daily. 30 tablet 11  . traZODone (DESYREL) 150 MG tablet TAKE TWO TABLETS BY MOUTH EVERY NIGHT AT BEDTIME 180 tablet 3  . triamcinolone cream (KENALOG) 0.5 % triamcinolone acetonide 0.5 % topical cream    . venlafaxine XR (EFFEXOR XR) 150 MG 24 hr capsule Take 1 tablet by mouth daily.    Marland Kitchen venlafaxine XR (EFFEXOR-XR) 75 MG 24 hr capsule TAKE 1 CAPSULE BY MOUTH DAILY WITH 150MGDOSE (Patient taking differently: TAKE 1 CAPSULE BY MOUTH DAILY WITH 150MGDOSE plus 75 mg to equal 293m) 90 capsule 0  . XTAMPZA ER 36 MG C12A Take 1 capsule by mouth 2 (two) times daily.  0   No current facility-administered medications for this visit.      ALLERGIES: Bactrim [sulfamethoxazole-trimethoprim]; Cyclobenzaprine; and  Methocarbamol  Family History  Problem Relation Age of Onset  . Diabetes Sister     Social History   Social History  . Marital status: Single    Spouse name: N/A  . Number of children: N/A  . Years of education: N/A   Occupational History  . Not on file.   Social History Main Topics  . Smoking status: Current Every Day Smoker    Packs/day: 1.00    Years: 31.00    Types: Cigarettes  . Smokeless tobacco: Never Used  . Alcohol use No     Comment: past hx of occas use 6-7 yrs per pt  . Drug use: No     Comment: once or twice weekly, but daily use this past weekend-stated stopped 6 weeks ago  . Sexual activity: Yes    Partners: Male    Birth control/ protection: Surgical     Comment: TLH/RSO 10/2013--06/2013 LSO   Other Topics Concern  . Not on file   Social History Narrative  . No narrative on file    ROS:  Pertinent items are noted in HPI.  PHYSICAL EXAMINATION:    BP 120/60 (BP Location: Right Arm, Patient Position: Sitting, Cuff Size: Normal)   Pulse 80   Resp 16   Ht 5' 1.75" (1.568 m)   Wt 141 lb (64 kg)   LMP 06/09/2013   BMI 26.00 kg/m     General appearance: alert, cooperative and appears stated ageH   ASSESSMENT  Microscopic hematuria.  Resolved.  Suprapubic/pelvic pain resolved.  Chronic diarrhea, constipation, and rectal bleeding issues.  Hx anal CIS.  Chronic use of narcotics. Situational stress.   PLAN  I recommend that the patient proceed [facility]Marland Kitchen She will call back to reschedule this.  Will reschedule her urodynamic testing. She understands she may need to do another urinalysis before this can be done.  I encouraged her to return to our office for her bladder concerns.    An After Visit Summary was printed and given to the patient.  __15____ minutes face to face time of which over 50% was spent in counseling.

## 2016-09-28 ENCOUNTER — Telehealth: Payer: Self-pay | Admitting: Obstetrics and Gynecology

## 2016-09-28 NOTE — Telephone Encounter (Signed)
Dr. Quincy Simmonds -please see patient message below and advise?

## 2016-09-28 NOTE — Telephone Encounter (Signed)
Spoke with patient, advised as seen below per Dr. Quincy Simmonds. Patient verbalizes understanding. Patient thankful for f/u call.  Routing to provider for final review. Patient is agreeable to disposition. Will close encounter.

## 2016-09-28 NOTE — Telephone Encounter (Signed)
Patient would like to know if she can have intercourse with a cystocele.

## 2016-09-28 NOTE — Telephone Encounter (Signed)
Intercourse will not cause pelvic organ prolapse or cystocele to become worse or cause complications.

## 2016-10-03 ENCOUNTER — Ambulatory Visit: Payer: Self-pay | Admitting: Obstetrics and Gynecology

## 2016-10-04 ENCOUNTER — Encounter: Payer: Self-pay | Admitting: Neurology

## 2016-10-09 ENCOUNTER — Telehealth: Payer: Self-pay

## 2016-10-09 NOTE — Telephone Encounter (Signed)
Spoke with patient. Patient is scheduled for Urodynamics testing on 10/24/2016 at 2 pm. Nurse visit for urine check scheduled for 10/18/2016 at 2 pm. Appointment to discuss results scheduled for 10/31/2016 at 2:30 pm with Dr.Silva. Patient is agreeable to all appointment dates and times. Urodynamics bladder testing form reviewed with patient. Patient verbalizes understanding of all instructions given. Urodynamics bladder testing form mailed to verified address on file.   Routing to provider for final review. Patient agreeable to disposition. Will close encounter.

## 2016-10-18 ENCOUNTER — Ambulatory Visit (INDEPENDENT_AMBULATORY_CARE_PROVIDER_SITE_OTHER): Payer: BLUE CROSS/BLUE SHIELD | Admitting: *Deleted

## 2016-10-18 VITALS — BP 110/70 | HR 84 | Temp 98.3°F | Resp 16 | Ht 61.75 in | Wt 154.0 lb

## 2016-10-18 DIAGNOSIS — N393 Stress incontinence (female) (male): Secondary | ICD-10-CM | POA: Diagnosis not present

## 2016-10-18 LAB — POCT URINALYSIS DIPSTICK
Bilirubin, UA: NEGATIVE
Glucose, UA: NEGATIVE
KETONES UA: NEGATIVE
Leukocytes, UA: NEGATIVE
Nitrite, UA: NEGATIVE
PROTEIN UA: NEGATIVE
RBC UA: NEGATIVE
UROBILINOGEN UA: 0.2 U/dL
pH, UA: 7 (ref 5.0–8.0)

## 2016-10-18 NOTE — Progress Notes (Signed)
Patient in today for UA prior Urodynamics. Patient states she still experiencing same symptoms discussed with Dr. Quincy Simmonds but nothing new. Symptoms include frequency and GI problems.   UA negative today  Routed to Dr. Quincy Simmonds for review  Encounter closed.

## 2016-10-24 ENCOUNTER — Ambulatory Visit: Payer: BLUE CROSS/BLUE SHIELD

## 2016-10-24 ENCOUNTER — Telehealth: Payer: Self-pay

## 2016-10-24 NOTE — Telephone Encounter (Signed)
Spoke with patient. Patient states she was seen this morning with [facility] for rectal prolapse evaluation with Dr.Ashburn. States Dr.Ashburn has recommended 2 different types of rectal surgery. One that involves mess and another that she is unsure of the name. Patient states that she would like Dr.Silva to perform her surgery and is torn on what to do as she is unsure if Dr.Silva can perform surgery recommended. Patient is scheduled for urodynamics today at 1:45 pm. Advised will review with Dr.Silva, but plan to keep appointment for now and will notify her if a change is recommended.  Reviewed with Dr.Silva. Lamont Snowball, RN left message for patient to return call.

## 2016-10-24 NOTE — Telephone Encounter (Signed)
Attempted to reach patient again at home number [phone] phone rang and rang with no answer.  Left message to call Verline Lema or Gay Filler at [phone] at patient's mobile number [phone].

## 2016-10-24 NOTE — Telephone Encounter (Addendum)
Return call from patient at 1210. Advised Dr Quincy Simmonds has reviewed patients call from this am. Advised that as Dr Quincy Simmonds previously discussed, there are many extenuating factors with her care and the additional surgery she mentioned this am is not a procedure that Dr Quincy Simmonds performs or will be able to assist with. Due to her complex needs, she may be better served at a university setting where she can have multiple surgeons available for coordination of care as well as management of post op pain issues.  Patient immediately upset and teary stating she has been waiting for two months for surgery Dr Quincy Simmonds said she could do. States she only called today asking to see Dr Quincy Simmonds for a "few seconds" when here for urodynamics to discuss the recommendation from appointment this am and make plans for surgery.  Patient very upset that we are "dumping her" after "leading her on." Advised patient that the procedure that can be preformed here is not same procedure as Dr Ashburn's procedure. We are actually trying to help and be sure she gets the best care for her circumstances.  Patient states to cancel her appointments today and next week and she will just "write reviews."  Kaitlyn Sprague RN present for 20 minute call.  Routing to provider for final review. Will close encounter.   '

## 2016-10-30 ENCOUNTER — Ambulatory Visit (INDEPENDENT_AMBULATORY_CARE_PROVIDER_SITE_OTHER): Payer: BLUE CROSS/BLUE SHIELD | Admitting: Cardiovascular Disease

## 2016-10-30 ENCOUNTER — Encounter: Payer: Self-pay | Admitting: Cardiovascular Disease

## 2016-10-30 VITALS — BP 108/69 | HR 72 | Ht 63.0 in | Wt 147.0 lb

## 2016-10-30 DIAGNOSIS — R072 Precordial pain: Secondary | ICD-10-CM

## 2016-10-30 DIAGNOSIS — F17200 Nicotine dependence, unspecified, uncomplicated: Secondary | ICD-10-CM | POA: Diagnosis not present

## 2016-10-30 DIAGNOSIS — R0602 Shortness of breath: Secondary | ICD-10-CM | POA: Diagnosis not present

## 2016-10-30 DIAGNOSIS — F1120 Opioid dependence, uncomplicated: Secondary | ICD-10-CM

## 2016-10-30 NOTE — Patient Instructions (Signed)
Medication Instructions: Dr Sallyanne Kuster recommends that you continue on your current medications as directed. Please refer to the Current Medication list given to you today.  Labwork: NONE ORDERED  Testing/Procedures: 1. Echocardiogram - Your physician has requested that you have an echocardiogram. Echocardiography is a painless test that uses sound waves to create images of your heart. It provides your doctor with information about the size and shape of your heart and how well your heart's chambers and valves are working. This procedure takes approximately one hour. There are no restrictions for this procedure. This will be performed at our [facility] location - [address] [place] physician has requested that you have a lexiscan myoview. For further information please visit [email or web address]. Please follow instruction sheet, as given.  Follow-up: Dr Sallyanne Kuster recommends that you schedule a follow-up appointment after testing is complete.  If you need a refill on your cardiac medications before your next appointment, please call your pharmacy.

## 2016-10-30 NOTE — Progress Notes (Signed)
Cardiology Office Note:    Date:  10/30/2016   ID:  NAFISAH RUNIONS, DOB [birth year], MRN [id number]  PCP:  Leonides Sake, MD  Cardiologist:  Sanda Klein, MD    Referring MD: Philmore Pali, NP   Chief Complaint  Patient presents with  . New Patient (Initial Visit)    possible heart murmur, chest heaviness, and occassional shortness of breath    History of Present Illness:    Stephanie Gregory is a 51 y.o. female with a hx of depression, anxiety, chronic low back pain due to degenerative disc disease s/p numerous surgeries, history of opiate addiction presents for complaints of exertional dyspnea ("climbing stairs") and random episodes chest pain at rest ("elephant on my chest"). The chest pain first occurred a year ago, again one week ago, both times at rest, unprovoked, not associated with meals.  Each episode lasted for a few hours, but was severe only for several minutes. The dyspnea has been occurring over the last several months to a year. There is no pattern of recent symptom acceleration. Denies orthopnea and PND, leg edema, syncope or palpitations. Activity is limited more by back and leg pain than by the dyspnea. Postmenopausal (surgical), on HRT for hot flashes for >3 years.  Past Medical History:  Diagnosis Date  . Abnormal Pap smear of cervix    as a teenager--hx colpo/cryotherapy to cervix  . Anxiety   . Bunion of left foot   . Cancer ([place]) 2016   anal--Dr.Rosenbower.  Carcinomain in situ.  Biopsy prior to excision is HR HPV positive.  . Chronic pain disorder   . DDD (degenerative disc disease)    has metal plate C4-C5 diskectomy  . Depression   . Endometriosis   . GERD (gastroesophageal reflux disease)   . H/O seasonal allergies    pollen and mold  . Mental disorder    Depression / Anxiety  . Migraine   . Numbness and tingling    left 5th finger and ring finger radiates downward from neck area   . Panic attacks   . Positive result for methicillin resistant  Staphylococcus aureus (MRSA) screening 2015, 2016  . Shaking    with anxiety   . Shortness of breath dyspnea    walking distances or climbing stairs none now since quit smoking (1 month)  . SVD (spontaneous vaginal delivery)    x 3  . Vaginal Pap smear, abnormal    CIN I - History    Past Surgical History:  Procedure Laterality Date  . ABDOMINAL HYSTERECTOMY  10/2013   [facility]/RSO  . ANTERIOR CERVICAL DECOMP/DISCECTOMY FUSION N/A 02/03/2014   Procedure: ANTERIOR CERVICAL DECOMPRESSION/DISCECTOMY FUSION 2 LEVELS;  Surgeon: Sinclair Ship, MD;  Location: [facility];  Service: Orthopedics;  Laterality: N/A;  Anterior cervical decompression fusion, cervical 5-6, cervical 6-7 with instrumentation and allograft  . ANTERIOR CERVICAL DECOMP/DISCECTOMY FUSION N/A 06/09/2014   Procedure: ANTERIOR CERVICAL DECOMPRESSION/DISCECTOMY FUSION 1 LEVEL;  Surgeon: Sinclair Ship, MD;  Location: [facility];  Service: Orthopedics;  Laterality: N/A;  Anterior cervical decompression fusion, cervical 3-4 with instrumentation and allograft  . COLON SURGERY  2016   Anal cancer--Dr.Rosenbower.   Carcinomain situ.  . CYSTOSCOPY N/A 09/15/2013   Procedure: CYSTOSCOPY;  Surgeon: Sanjuana Kava, MD;  Location: [facility];  Service: Gynecology;  Laterality: N/A;  . DILATATION & CURRETTAGE/HYSTEROSCOPY WITH RESECTOCOPE N/A 06/16/2013   Procedure: DILATATION & CURETTAGE/HYSTEROSCOPY WITH RESECTOCOPE/CERVICAL POLYPECTOMY;  Surgeon: Sanjuana Kava, MD;  Location: [facility];  Service: Gynecology;  Laterality: N/A;  . DILATION AND CURETTAGE OF UTERUS    . EXAMINATION UNDER ANESTHESIA N/A 10/28/2014   Procedure: EXAM UNDER ANESTHESIA, REMOVAL OF SQUAMOUS CELL CARCINOMA IN SITU OF THE ANUS;  Surgeon: Jackolyn Confer, MD;  Location: [facility];  Service: General;  Laterality: N/A;  . HARDWARE REMOVAL N/A 02/03/2014   Procedure: HARDWARE REMOVAL;  Surgeon: Sinclair Ship, MD;  Location: [facility];  Service: Orthopedics;  Laterality: N/A;  removal of  hardware cervical 4-5.  Marland Kitchen LAPAROSCOPIC ASSISTED VAGINAL HYSTERECTOMY Right 09/15/2013   Procedure: LAPAROSCOPIC ASSISTED VAGINAL HYSTERECTOMY with right salpingo-oophorectomy;  Surgeon: Sanjuana Kava, MD;  Location: [facility];  Service: Gynecology;  Laterality: Right;  . LAPAROSCOPY N/A 06/16/2013   Procedure: LAPAROSCOPY OPERATIVE SALPINGO OOPHORECTOMY;  Surgeon: Sanjuana Kava, MD;  Location: [facility];  Service: Gynecology;  Laterality: N/A;  . NASAL SINUS SURGERY  2000  . NECK SURGERY  12/1999   C4-C5 diskectomy  . POSTERIOR CERVICAL FUSION/FORAMINOTOMY N/A 10/27/2015   Procedure: POSTERIOR SPINAL FUSION, CERVICAL 2-3, CERVICAL 3-4, CERVICAL 4-5 WITH INSTRUMENTATION AND ALLOGRAFT;  Surgeon: Phylliss Bob, MD;  Location: [facility];  Service: Orthopedics;  Laterality: N/A;  POSTERIOR SPINAL FUSION, CERVICAL 2-3, CERVICAL 3-4, CERVICAL 4-5 WITH INSTRUMENTATION AND ALLOGRAFT  . ROTATOR CUFF REPAIR Left   . TUBAL LIGATION  10/2000  . WISDOM TOOTH EXTRACTION      Current Medications: Current Meds  Medication Sig  . ALPRAZolam (XANAX) 1 MG tablet Take 1 tablet by mouth 3 (three) times daily.  Marland Kitchen docusate sodium (COLACE) 100 MG capsule Take 200 mg by mouth daily.   Marland Kitchen esomeprazole (NEXIUM) 40 MG capsule as needed.  Marland Kitchen HORIZANT 600 MG TBCR Take 1 tablet by mouth 2 (two) times daily.  Marland Kitchen lidocaine (LIDODERM) 5 % Place 1 patch onto the skin daily. Remove & Discard patch within 12 hours or as directed by MD  . montelukast (SINGULAIR) 10 MG tablet Take 1 tablet (10 mg total) by mouth at bedtime.  Marland Kitchen MOVANTIK 25 MG TABS tablet Take 1 tablet by mouth daily.  Melynda Ripple Tosylate (SYMPROIC) 0.2 MG TABS Take 0.2 mg by mouth daily.  Marland Kitchen NARCAN 4 MG/0.1ML LIQD nasal spray kit Place into the nose as needed.   . nicotine (NICODERM CQ - DOSED IN MG/24 HOURS) 21 mg/24hr patch Place 21 mg onto the skin daily.  . ondansetron (ZOFRAN) 8 MG tablet Take 1 tablet by mouth as needed.  Marland Kitchen oxyCODONE (ROXICODONE) 15 MG immediate release tablet  Take 1 tablet by mouth every 4 (four) hours as needed.   . Oxycodone HCl 20 MG TABS Take 1 tablet by mouth every 4 (four) hours.  Marland Kitchen PROCTOZONE-HC 2.5 % rectal cream Place 1 application rectally daily as needed for hemorrhoids or itching.   Marland Kitchen rOPINIRole (REQUIP) 1 MG tablet Take 1 tablet (1 mg total) by mouth at bedtime.  . silver sulfADIAZINE (SILVADENE) 1 % cream Apply 1 application topically 2 (two) times daily.  . SUMAtriptan (IMITREX) 100 MG tablet Take 1 tablet by mouth as needed.  . SUMAtriptan 6 MG/0.5ML SOAJ Inject 1 Dose into the muscle as needed.  Marland Kitchen tiZANidine (ZANAFLEX) 4 MG tablet Take 1 tablet by mouth 2 (two) times daily.  Marland Kitchen topiramate (TOPAMAX) 200 MG tablet Take 1 tablet (200 mg total) by mouth daily.  . traZODone (DESYREL) 150 MG tablet TAKE TWO TABLETS BY MOUTH EVERY NIGHT AT BEDTIME  . triamcinolone cream (KENALOG) 0.5 % triamcinolone acetonide 0.5 % topical cream  .  venlafaxine XR (EFFEXOR XR) 150 MG 24 hr capsule Take 1 tablet by mouth daily.  Marland Kitchen venlafaxine XR (EFFEXOR-XR) 75 MG 24 hr capsule TAKE 1 CAPSULE BY MOUTH DAILY WITH 150MGDOSE (Patient taking differently: TAKE 1 CAPSULE BY MOUTH DAILY WITH 150MGDOSE plus 75 mg to equal 270m)  . XTAMPZA ER 27 MG C12A Take 1 tablet by mouth 2 (two) times daily.     Allergies:   Bactrim [sulfamethoxazole-trimethoprim]; Cyclobenzaprine; and Methocarbamol   Social History   Social History  . Marital status: Single    Spouse name: N/A  . Number of children: N/A  . Years of education: N/A   Social History Main Topics  . Smoking status: Current Every Day Smoker    Packs/day: 1.00    Years: 31.00    Types: Cigarettes  . Smokeless tobacco: Never Used  . Alcohol use No     Comment: past hx of occas use 6-7 yrs per pt  . Drug use: No     Comment: once or twice weekly, but daily use this past weekend-stated stopped 6 weeks ago  . Sexual activity: Yes    Partners: Male    Birth control/ protection: Surgical     Comment:  TLH/RSO 10/2013--06/2013 [facility]   Other Topics Concern  . None   Social History Narrative  . None     Family History: The patient's family history includes Diabetes in her father and sister. ROS:   Please see the history of present illness.     All other systems reviewed and are negative.  EKGs/Labs/Other Studies Reviewed:    The following studies were reviewed today: Chest x rays (normal, most recent 2017), abdominal [place](no AAA 2012)  EKG:  EKG is  ordered today.  The ekg ordered today demonstrates NSR, possible left atrial abnormality, minor incomplete RBBB, normal repol pattern, QTc 444 ms.  Recent Labs: No results found for requested labs within last 8760 hours.  Recent Lipid Panel    Component Value Date/Time   CHOL 193 11/22/2014 1354   TRIG 87 11/22/2014 1354   HDL 64 11/22/2014 1354   CHOLHDL 3.0 11/22/2014 1354   VLDL 17 11/22/2014 1354   LDLCALC 112 11/22/2014 1354    Physical Exam:    VS:  BP 108/69   Pulse 72   Ht 5' 3"  (1.6 m)   Wt 147 lb (66.7 kg)   LMP 06/09/2013   BMI 26.04 kg/m     Wt Readings from Last 3 Encounters:  10/30/16 147 lb (66.7 kg)  10/18/16 154 lb (69.9 kg)  09/26/16 141 lb (64 kg)     GEN:  Well nourished, well developed in no acute distress HEENT: Normal NECK: No JVD; No carotid bruits LYMPHATICS: No lymphadenopathy CARDIAC: RRR, no murmurs, rubs, gallops RESPIRATORY:  Clear to auscultation without rales, wheezing or rhonchi  ABDOMEN: Soft, non-tender, non-distended MUSCULOSKELETAL:  No edema; No deformity  SKIN: Warm and dry NEUROLOGIC:  Alert and oriented x 3 PSYCHIATRIC:  Normal affect   ASSESSMENT:    1. Precordial chest pain   2. Shortness of breath   3. Smoker   4. Uncomplicated opioid dependence ([place]    PLAN:    In order of problems listed above:  1. Chest pain: atypical, at rest. Limited risk factors (postmenopausal, active smoker), but unfortunately, difficult to assess functional status due to back  problems. Schedule for [employer] 2. Dyspnea: could well be explained by deconditioning and smoking. Recommend smoking cessation. Check echo.  If  echo and nuclear scan are normal, would not pursue a cardiac diagnosis any further, but focus on smoking cessation and some type of non-weight bearing exercise such as swimming.   Medication Adjustments/Labs and Tests Ordered: Current medicines are reviewed at length with the patient today.  Concerns regarding medicines are outlined above.  Orders Placed This Encounter  Procedures  . Myocardial Perfusion Imaging  . EKG 12-Lead  . ECHOCARDIOGRAM COMPLETE   No orders of the defined types were placed in this encounter.   Signed, Sanda Klein, MD  10/30/2016 5:34 PM    [place]

## 2016-10-31 ENCOUNTER — Ambulatory Visit: Payer: BLUE CROSS/BLUE SHIELD | Admitting: Obstetrics and Gynecology

## 2016-11-05 ENCOUNTER — Other Ambulatory Visit: Payer: Self-pay

## 2016-11-05 ENCOUNTER — Ambulatory Visit (HOSPITAL_COMMUNITY): Payer: BLUE CROSS/BLUE SHIELD | Attending: Internal Medicine

## 2016-11-05 DIAGNOSIS — I080 Rheumatic disorders of both mitral and aortic valves: Secondary | ICD-10-CM | POA: Insufficient documentation

## 2016-11-05 DIAGNOSIS — R0602 Shortness of breath: Secondary | ICD-10-CM | POA: Diagnosis not present

## 2016-11-07 ENCOUNTER — Telehealth: Payer: Self-pay | Admitting: Cardiovascular Disease

## 2016-11-07 NOTE — Telephone Encounter (Signed)
Received records from [facility] for appointment on 01/04/17 with Dr Sallyanne Kuster.  Records put with Dr Croitoru's schedule for 01/04/17. lp

## 2016-11-13 ENCOUNTER — Ambulatory Visit: Payer: BLUE CROSS/BLUE SHIELD | Admitting: Neurology

## 2016-11-16 ENCOUNTER — Telehealth (HOSPITAL_COMMUNITY): Payer: Self-pay

## 2016-11-16 NOTE — Telephone Encounter (Signed)
Encounter complete. 

## 2016-11-19 ENCOUNTER — Ambulatory Visit: Payer: BLUE CROSS/BLUE SHIELD | Admitting: Neurology

## 2016-11-21 ENCOUNTER — Inpatient Hospital Stay (HOSPITAL_COMMUNITY): Admission: RE | Admit: 2016-11-21 | Payer: BLUE CROSS/BLUE SHIELD | Source: Ambulatory Visit

## 2017-01-04 ENCOUNTER — Ambulatory Visit: Payer: Self-pay | Admitting: Cardiovascular Disease

## 2017-01-14 ENCOUNTER — Ambulatory Visit: Payer: Self-pay | Admitting: Neurology

## 2017-02-25 ENCOUNTER — Emergency Department (HOSPITAL_COMMUNITY)
Admission: EM | Admit: 2017-02-25 | Discharge: 2017-02-25 | Disposition: A | Discharge status: Home / Self Care | Payer: BLUE CROSS/BLUE SHIELD | Attending: Emergency Medicine | Admitting: Emergency Medicine

## 2017-02-25 ENCOUNTER — Encounter (HOSPITAL_COMMUNITY): Payer: Self-pay | Admitting: Emergency Medicine

## 2017-02-25 DIAGNOSIS — Z5321 Procedure and treatment not carried out due to patient leaving prior to being seen by health care provider: Secondary | ICD-10-CM | POA: Diagnosis not present

## 2017-02-25 DIAGNOSIS — R109 Unspecified abdominal pain: Secondary | ICD-10-CM | POA: Insufficient documentation

## 2017-02-25 NOTE — ED Notes (Signed)
Called for treatment room without response in lobby

## 2017-02-25 NOTE — ED Triage Notes (Signed)
No response in lobby for treatment room
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
## 2017-02-25 NOTE — ED Notes (Signed)
No response in lobby for treatment room

## 2017-02-25 NOTE — ED Provider Notes (Signed)
Called by Dr. Landry Mellow by phone from [facility]. She would like to be contacted by the EDP when seen. [phone].  Nanda Quinton, MD    Margette Fast, MD 02/25/17 2013

## 2017-04-26 ENCOUNTER — Telehealth: Payer: Self-pay | Admitting: Obstetrics and Gynecology

## 2017-04-26 NOTE — Telephone Encounter (Signed)
Spoke with patient. Patient states that she had surgery on 02/15/2018 with Dr.Ashburn and Dr.Parker-Autry for RECTOPEXY SACRAL COLPOPEXY POSTERIOR COLPORRHAPHY MID URETHRAL SLING WITH CYSTO. Was referred by our office as were we unable to perform all aspects of the surgery the patient needed. States post operatively she had a problem with her wound and was hospitalized for infection. Patient currently has a wound vac. Was seen for follow up with Dr.Parker-Autry and not satisfied with care. Is seeing Dr.Ashburn for wound care and continued surgery follow up. States that she was advised by Dr.Parker-Autry after surgery she was not able to complete everything that was needed. "She said she could not attach the part of the body where the bladder separates uterus and rectumor soemthing. I am still using the bathroom every 3-5 minutes and I do not feel comfortable returning to her for further surgery." Asking if there is anything our office can do to assist. Advised will review with covering MD and return call.

## 2017-04-26 NOTE — Telephone Encounter (Signed)
Reviewed with Dr Sabra Heck, MD on call.  Return call to patient. States voiding has been like this since surgery and increased this week. Requesting to see uro-gynecologist. Advised Dr Quincy Simmonds on LOA with uncertain return date.  With current active surgical issues unresolved with other physicians, we would not recommend intervening in her care. Patient insists she has been released from Dr Landry Mellow and wants new uro-gynecologist.  Advised again, Dr Quincy Simmonds out of office and uncertain when she will return.    Discussed Dr Matilde Sprang as urology option. Recommended ED if symptoms persist. Denies fever.  Patient frustrated that we were unable to see her and provide care.  Routing to Dr Sabra Heck for final; review.

## 2017-04-26 NOTE — Telephone Encounter (Signed)
Patient would like to speak with nurse about a problem she's having and possible surgery.

## 2017-06-17 ENCOUNTER — Emergency Department (HOSPITAL_COMMUNITY)
Admission: EM | Admit: 2017-06-17 | Discharge: 2017-06-17 | Disposition: A | Discharge status: Home / Self Care | Payer: BLUE CROSS/BLUE SHIELD | Attending: Emergency Medicine | Admitting: Emergency Medicine

## 2017-06-17 ENCOUNTER — Encounter (HOSPITAL_COMMUNITY): Payer: Self-pay | Admitting: Emergency Medicine

## 2017-06-17 DIAGNOSIS — F419 Anxiety disorder, unspecified: Secondary | ICD-10-CM | POA: Insufficient documentation

## 2017-06-17 DIAGNOSIS — Z79899 Other long term (current) drug therapy: Secondary | ICD-10-CM | POA: Diagnosis not present

## 2017-06-17 DIAGNOSIS — F17210 Nicotine dependence, cigarettes, uncomplicated: Secondary | ICD-10-CM | POA: Insufficient documentation

## 2017-06-17 DIAGNOSIS — T782XXA Anaphylactic shock, unspecified, initial encounter: Secondary | ICD-10-CM

## 2017-06-17 DIAGNOSIS — R451 Restlessness and agitation: Secondary | ICD-10-CM | POA: Insufficient documentation

## 2017-06-17 DIAGNOSIS — T7840XA Allergy, unspecified, initial encounter: Secondary | ICD-10-CM | POA: Insufficient documentation

## 2017-06-17 LAB — I-STAT BETA HCG BLOOD, ED (MC, WL, AP ONLY)

## 2017-06-17 MED ORDER — METHYLPREDNISOLONE SODIUM SUCC 125 MG IJ SOLR
125.0000 mg | Freq: Once | INTRAMUSCULAR | Status: AC
  Administered 2017-06-17: 125 mg via INTRAVENOUS
  Filled 2017-06-17: qty 2

## 2017-06-17 MED ORDER — SODIUM CHLORIDE 0.9 % IV BOLUS (SEPSIS)
1000.0000 mL | Freq: Once | INTRAVENOUS | Status: AC
Start: 2017-06-17 — End: 2017-06-17
  Administered 2017-06-17: 1000 mL via INTRAVENOUS

## 2017-06-17 MED ORDER — SODIUM CHLORIDE 0.9 % IV SOLN: INTRAVENOUS | Status: DC

## 2017-06-17 MED ORDER — PREDNISONE 50 MG PO TABS: 50.0000 mg | ORAL_TABLET | Freq: Every day | ORAL | 0 refills | Status: AC

## 2017-06-17 MED ORDER — EPINEPHRINE 0.3 MG/0.3ML IJ SOAJ: 0.3000 mg | Freq: Once | INTRAMUSCULAR | 0 refills | Status: AC

## 2017-06-17 MED ORDER — FAMOTIDINE IN NACL 20-0.9 MG/50ML-% IV SOLN
20.0000 mg | Freq: Once | INTRAVENOUS | Status: AC
  Administered 2017-06-17: 20 mg via INTRAVENOUS
  Filled 2017-06-17: qty 50

## 2017-06-17 MED ORDER — LORAZEPAM 2 MG/ML IJ SOLN
1.0000 mg | Freq: Once | INTRAMUSCULAR | Status: AC
  Administered 2017-06-17: 1 mg via INTRAVENOUS
  Filled 2017-06-17: qty 1

## 2017-06-17 NOTE — ED Provider Notes (Signed)
[facility] EMERGENCY DEPARTMENT Provider Note   CSN: [id number] Arrival date & time: 06/17/17  7902     History   Chief Complaint Chief Complaint  Patient presents with  . Allergic Reaction    HPI Stephanie Gregory is a 52 y.o. female.  The history is provided by the patient, a significant other and medical records.  Allergic Reaction  Presenting symptoms: no difficulty breathing, no difficulty swallowing, no itching, no rash, no swelling and no wheezing   Severity:  Mild Duration:  6 hours Prior allergic episodes:  No prior episodes Context: medications (melatonin)   Relieved by:  Epinephrine and antihistamines Worsened by:  Nothing Ineffective treatments:  None tried   Past Medical History:  Diagnosis Date  . Abnormal Pap smear of cervix    as a teenager--hx colpo/cryotherapy to cervix  . Anxiety   . Bunion of left foot   . Cancer ([place]) 2016   anal--Dr.Rosenbower.  Carcinomain in situ.  Biopsy prior to excision is HR HPV positive.  . Chronic pain disorder   . DDD (degenerative disc disease)    has metal plate C4-C5 diskectomy  . Depression   . Endometriosis   . GERD (gastroesophageal reflux disease)   . H/O seasonal allergies    pollen and mold  . Mental disorder    Depression / Anxiety  . Migraine   . Numbness and tingling    left 5th finger and ring finger radiates downward from neck area   . Panic attacks   . Positive result for methicillin resistant Staphylococcus aureus (MRSA) screening 2015, 2016  . Shaking    with anxiety   . Shortness of breath dyspnea    walking distances or climbing stairs none now since quit smoking (1 month)  . SVD (spontaneous vaginal delivery)    x 3  . Vaginal Pap smear, abnormal    CIN I - History    Patient Active Problem List   Diagnosis Date Noted  . Facet arthritis of cervical region ([place]) 10/27/2015  . Low back pain 09/08/2015  . Left shoulder pain 07/05/2015  . Opiate addiction ([place])  02/16/2015  . Sinusitis, chronic 11/22/2014  . Smoker 09/23/2014  . Anal cancer ([place]) 09/23/2014  . Moderate major depression ([place]) 09/23/2014  . Hormone replacement therapy, surgical menopause 09/23/2014  . Annual physical exam 09/23/2014  . Migraine headache without aura 09/23/2014  . Restless leg syndrome 09/23/2014  . Degenerative disc disease, cervical post-ACDF 02/03/2014  . S/P hysterectomy with oophorectomy 09/15/2013    Past Surgical History:  Procedure Laterality Date  . ABDOMINAL HYSTERECTOMY  10/2013   [facility]/RSO  . ANTERIOR CERVICAL DECOMP/DISCECTOMY FUSION N/A 02/03/2014   Procedure: ANTERIOR CERVICAL DECOMPRESSION/DISCECTOMY FUSION 2 LEVELS;  Surgeon: Sinclair Ship, MD;  Location: [facility];  Service: Orthopedics;  Laterality: N/A;  Anterior cervical decompression fusion, cervical 5-6, cervical 6-7 with instrumentation and allograft  . ANTERIOR CERVICAL DECOMP/DISCECTOMY FUSION N/A 06/09/2014   Procedure: ANTERIOR CERVICAL DECOMPRESSION/DISCECTOMY FUSION 1 LEVEL;  Surgeon: Sinclair Ship, MD;  Location: [facility];  Service: Orthopedics;  Laterality: N/A;  Anterior cervical decompression fusion, cervical 3-4 with instrumentation and allograft  . COLON SURGERY  2016   Anal cancer--Dr.Rosenbower.   Carcinomain situ.  . CYSTOSCOPY N/A 09/15/2013   Procedure: CYSTOSCOPY;  Surgeon: Sanjuana Kava, MD;  Location: [facility];  Service: Gynecology;  Laterality: N/A;  . DILATATION & CURRETTAGE/HYSTEROSCOPY WITH RESECTOCOPE N/A 06/16/2013   Procedure: DILATATION & CURETTAGE/HYSTEROSCOPY WITH RESECTOCOPE/CERVICAL POLYPECTOMY;  Surgeon:  Sanjuana Kava, MD;  Location: [facility];  Service: Gynecology;  Laterality: N/A;  . DILATION AND CURETTAGE OF UTERUS    . EXAMINATION UNDER ANESTHESIA N/A 10/28/2014   Procedure: EXAM UNDER ANESTHESIA, REMOVAL OF SQUAMOUS CELL CARCINOMA IN SITU OF THE ANUS;  Surgeon: Jackolyn Confer, MD;  Location: [facility];  Service: General;  Laterality: N/A;  . HARDWARE REMOVAL N/A  02/03/2014   Procedure: HARDWARE REMOVAL;  Surgeon: Sinclair Ship, MD;  Location: [facility];  Service: Orthopedics;  Laterality: N/A;  removal of hardware cervical 4-5.  Marland Kitchen LAPAROSCOPIC ASSISTED VAGINAL HYSTERECTOMY Right 09/15/2013   Procedure: LAPAROSCOPIC ASSISTED VAGINAL HYSTERECTOMY with right salpingo-oophorectomy;  Surgeon: Sanjuana Kava, MD;  Location: [facility];  Service: Gynecology;  Laterality: Right;  . LAPAROSCOPY N/A 06/16/2013   Procedure: LAPAROSCOPY OPERATIVE SALPINGO OOPHORECTOMY;  Surgeon: Sanjuana Kava, MD;  Location: [facility];  Service: Gynecology;  Laterality: N/A;  . NASAL SINUS SURGERY  2000  . NECK SURGERY  12/1999   C4-C5 diskectomy  . POSTERIOR CERVICAL FUSION/FORAMINOTOMY N/A 10/27/2015   Procedure: POSTERIOR SPINAL FUSION, CERVICAL 2-3, CERVICAL 3-4, CERVICAL 4-5 WITH INSTRUMENTATION AND ALLOGRAFT;  Surgeon: Phylliss Bob, MD;  Location: [facility];  Service: Orthopedics;  Laterality: N/A;  POSTERIOR SPINAL FUSION, CERVICAL 2-3, CERVICAL 3-4, CERVICAL 4-5 WITH INSTRUMENTATION AND ALLOGRAFT  . ROTATOR CUFF REPAIR Left   . TUBAL LIGATION  10/2000  . WISDOM TOOTH EXTRACTION      OB History    Gravida Para Term Preterm AB Living   3 3       3    SAB TAB Ectopic Multiple Live Births                   Home Medications    Prior to Admission medications   Medication Sig Start Date End Date Taking? Authorizing Provider  ALPRAZolam Duanne Moron) 1 MG tablet Take 1 tablet by mouth 3 (three) times daily.    [provider]  docusate sodium (COLACE) 100 MG capsule Take 200 mg by mouth daily.     [provider]  esomeprazole (NEXIUM) 40 MG capsule as needed. 09/21/16   [provider]  HORIZANT 600 MG TBCR Take 1 tablet by mouth 2 (two) times daily. 09/17/16   [provider]  lidocaine (LIDODERM) 5 % Place 1 patch onto the skin daily. Remove & Discard patch within 12 hours or as directed by MD    [provider]  montelukast (SINGULAIR) 10 MG tablet  Take 1 tablet (10 mg total) by mouth at bedtime. 03/09/15   Silverio Decamp, MD  MOVANTIK 25 MG TABS tablet Take 1 tablet by mouth daily. 09/17/16   [provider]  Naldemedine Tosylate (SYMPROIC) 0.2 MG TABS Take 0.2 mg by mouth daily. 10/09/16   [provider]  NARCAN 4 MG/0.1ML LIQD nasal spray kit Place into the nose as needed.  07/26/16   [provider]  nicotine (NICODERM CQ - DOSED IN MG/24 HOURS) 21 mg/24hr patch Place 21 mg onto the skin daily.    [provider]  ondansetron (ZOFRAN) 8 MG tablet Take 1 tablet by mouth as needed.    [provider]  oxyCODONE (ROXICODONE) 15 MG immediate release tablet Take 1 tablet by mouth every 4 (four) hours as needed.     [provider]  Oxycodone HCl 20 MG TABS Take 1 tablet by mouth every 4 (four) hours. 10/16/16   [provider]  PROCTOZONE-HC 2.5 % rectal cream Place 1  application rectally daily as needed for hemorrhoids or itching.  08/19/14   [provider]  rOPINIRole (REQUIP) 1 MG tablet Take 1 tablet (1 mg total) by mouth at bedtime. 09/23/14   Silverio Decamp, MD  silver sulfADIAZINE (SILVADENE) 1 % cream Apply 1 application topically 2 (two) times daily.    [provider]  SUMAtriptan (IMITREX) 100 MG tablet Take 1 tablet by mouth as needed. 09/07/16   [provider]  SUMAtriptan 6 MG/0.5ML SOAJ Inject 1 Dose into the muscle as needed.    [provider]  tiZANidine (ZANAFLEX) 4 MG tablet Take 1 tablet by mouth 2 (two) times daily. 10/08/16   [provider]  topiramate (TOPAMAX) 200 MG tablet Take 1 tablet (200 mg total) by mouth daily. 04/22/15   Silverio Decamp, MD  traZODone (DESYREL) 150 MG tablet TAKE TWO TABLETS BY MOUTH EVERY NIGHT AT BEDTIME 10/31/15   Silverio Decamp, MD  triamcinolone cream (KENALOG) 0.5 % triamcinolone acetonide 0.5 % topical cream    [provider]  venlafaxine XR (EFFEXOR  XR) 150 MG 24 hr capsule Take 1 tablet by mouth daily.    [provider]  venlafaxine XR (EFFEXOR-XR) 75 MG 24 hr capsule TAKE 1 CAPSULE BY MOUTH DAILY WITH 150MGDOSE Patient taking differently: TAKE 1 CAPSULE BY MOUTH DAILY WITH 150MGDOSE plus 75 mg to equal 235m 09/30/15   MHali Marry MD  XTAMPZA ER 27 MG C12A Take 1 tablet by mouth 2 (two) times daily. 10/16/16   [provider]    Family History Family History  Problem Relation Age of Onset  . Diabetes Father   . Diabetes Sister     Social History Social History   Tobacco Use  . Smoking status: Current Every Day Smoker    Packs/day: 1.00    Years: 31.00    Pack years: 31.00    Types: Cigarettes  . Smokeless tobacco: Never Used  Substance Use Topics  . Alcohol use: No    Comment: past hx of occas use 6-7 yrs per pt  . Drug use: No    Comment: once or twice weekly, but daily use this past weekend-stated stopped 6 weeks ago     Allergies   Bactrim [sulfamethoxazole-trimethoprim]; Cyclobenzaprine; and Methocarbamol   Review of Systems Review of Systems  Constitutional: Negative for chills, fatigue and fever.  HENT: Negative for congestion, rhinorrhea and trouble swallowing.   Respiratory: Negative for chest tightness, shortness of breath, wheezing and stridor.   Cardiovascular: Negative for chest pain.  Gastrointestinal: Negative for abdominal pain, diarrhea, nausea and vomiting.  Genitourinary: Negative for dysuria.  Musculoskeletal: Negative for back pain, neck pain and neck stiffness.  Skin: Negative for itching, rash and wound.  Neurological: Negative for dizziness, light-headedness and headaches.  Psychiatric/Behavioral: Positive for agitation.  All other systems reviewed and are negative.    Physical Exam Updated Vital Signs Ht 5' 3"  (1.6 m)   Wt 66.7 kg (147 lb)   LMP 06/09/2013   SpO2 100%   BMI 26.04 kg/m   Physical Exam  Constitutional: She is oriented to person,  place, and time. She appears well-developed and well-nourished. No distress.  HENT:  Head: Normocephalic and atraumatic.  Nose: Nose normal.  Mouth/Throat: Oropharynx is clear and moist. No oropharyngeal exudate.  Eyes: Conjunctivae and EOM are normal. Pupils are equal, round, and reactive to light.  Neck: Normal range of motion.  Cardiovascular: Normal rate and intact distal pulses.  No murmur  heard. Pulmonary/Chest: Effort normal. No stridor. No respiratory distress. She has no wheezes. She has no rales. She exhibits no tenderness.  Abdominal: She exhibits no distension. There is no tenderness.  Musculoskeletal: She exhibits no edema or tenderness.  Neurological: She is alert and oriented to person, place, and time. No sensory deficit. She exhibits normal muscle tone.  Skin: Capillary refill takes less than 2 seconds. No rash noted. She is not diaphoretic. No erythema. No pallor.  Psychiatric: She has a normal mood and affect.  Nursing note and vitals reviewed.    ED Treatments / Results  Labs (all labs ordered are listed, but only abnormal results are displayed) Labs Reviewed  I-STAT BETA HCG BLOOD, ED ([facility], [facility], [facility] ONLY)    EKG  EKG Interpretation  Date/Time:  Monday June 17 2017 07:14:21 EDT Ventricular Rate:  92 PR Interval:    QRS Duration: 92 QT Interval:  388 QTC Calculation: 480 R Axis:   -2 Text Interpretation:  Sinus rhythm Borderline short PR interval Probable left atrial enlargement RSR' in V1 or V2, right VCD or RVH Borderline T abnormalities, anterior leads When compard to prior, no signifncant changes seen.  No STEMI Confirmed by Antony Blackbird [phone]) on 06/17/2017 7:33:36 AM       Radiology No results found.  Procedures Procedures (including critical care time)  Medications Ordered in ED Medications  LORazepam (ATIVAN) injection 1 mg (1 mg Intravenous Given 06/17/17 0756)  sodium chloride 0.9 % bolus 1,000 mL (0 mLs Intravenous Stopped 06/17/17 0856)      And  sodium chloride 0.9 % bolus 1,000 mL (0 mLs Intravenous Stopped 06/17/17 0915)  methylPREDNISolone sodium succinate (SOLU-MEDROL) 125 mg/2 mL injection 125 mg (125 mg Intravenous Given 06/17/17 0756)  famotidine (PEPCID) IVPB 20 mg premix (0 mg Intravenous Stopped 06/17/17 0827)     Initial Impression / Assessment and Plan / ED Course  I have reviewed the triage vital signs and the nursing notes.  Pertinent labs & imaging results that were available during my care of the patient were reviewed by me and considered in my medical decision making (see chart for details).     Stephanie Gregory is a 52 y.o. female with a past medical history significant for migraines, GERD, anxiety, and depression who presents with allergic reaction and agitation.  Patient reports that she took melatonin last night for the first time and reports this morning she was unable to breathe.  She reports that she is having a "bumpy rash", shortness of breath, and throat closing sensation.  Patient received epinephrine IM in route to the ED and take Benadryl at home.  She reports that she is feeling better but still feeling the reaction.  She reports she is never had allergic reaction before.  She also is accompanied by her fianc who reports that they are both being transitioned "off of Xanax so they can be on opioids." Patient also reports that she had a lower abdominal surgery months ago that is being managed at [facility] by the urology and GU team.  On exam, patient is very anxious.  Patient is pulling at her EKG leads.  Patient is restless and jittery.  Patient is requesting alcohol swabs to help her skin and to "reveal the rashes".  Patient did not appear to have any rash on my initial exam.  Patient does have a wound that is covered and dressed on her abdomen which she reports she is doing wet-to-dry is on.  There is  no significant tenderness surrounding the wound.  Back nontender.  Lungs clear.  No wheezing.  No  stridor.  Oropharyngeal exam unremarkable.  Patient following all commands and moving all extremities.  Patient has pulses in all extremities.  As patient received epinephrine, patient will be observed for several hours to watch for recurrent reaction.  I feel the epinephrine is likely agitated the patient given her baseline anxiety and the possibility that she is in the process of withdrawing from benzodiazepines.  Given patient's agitation, patient will be given Ativan as well as the other antihistamine, fluids, and steroids.  Anticipate reassessment after observation and meds.   10:18 AM Patient was feeling better.  No further throat tightening or feeling anxious.  Patient was observed for a little while longer.  Anticipate discharge with prescription for EpiPen and several days of steroids.  Patient continues to feel better and was felt safe for discharge home.  Patient encouraged to stay hydrated.  Patient will follow up with her PCP for further management.  Patient had no other questions or concerns and patient was discharged in good condition with resolution of her allergic reaction.   Final Clinical Impressions(s) / ED Diagnoses   Final diagnoses:  Allergic reaction, initial encounter  Anaphylaxis, initial encounter    ED Discharge Orders        Ordered    EPINEPHrine 0.3 mg/0.3 mL IJ SOAJ injection   Once     06/17/17 1143    predniSONE (DELTASONE) 50 MG tablet  Daily     06/17/17 1143     Clinical Impression: 1. Allergic reaction, initial encounter   2. Anaphylaxis, initial encounter     Disposition: Discharge  Condition: Good  I have discussed the results, Dx and Tx plan with the pt(& family if present). He/she/they expressed understanding and agree(s) with the plan. Discharge instructions discussed at great length. Strict return precautions discussed and pt &/or family have verbalized understanding of the instructions. No further questions at time of discharge.     New Prescriptions   EPINEPHRINE 0.3 MG/0.3 ML IJ SOAJ INJECTION    Inject 0.3 mLs (0.3 mg total) into the muscle once for 1 dose.   PREDNISONE (DELTASONE) 50 MG TABLET    Take 1 tablet (50 mg total) by mouth daily for 4 days.    Follow Up: Practice, [facility] [place] [id number] [phone]     [facility] EMERGENCY DEPARTMENT [address] [phone] mc [place]         Tegeler, Gwenyth Allegra, MD 06/17/17 1721

## 2017-06-17 NOTE — Discharge Instructions (Signed)
We suspect he had allergic reaction today that the antihistamines and epinephrine helped treat.  We observed for several hours and he did not have recurrence of her reaction.  Please take the steroids for several days and you may use over-the-counter Benadryl.  Please follow-up with your primary doctor for allergy testing.  Please stay hydrated.  If any symptoms change or worsen, please return to the nearest emergency department.

## 2017-06-17 NOTE — ED Triage Notes (Addendum)
Brought by ems from home for c/o allergic reaction.  Patient reports taking melatonin last night and is sure its causing a reaction.  Reports having a rash, itching, and that her throat is closing up.  Given Epi en route.  Patient reports taking 3 benadryl pta.  Per family patient is being weaned off of xanax.

## 2017-06-18 DIAGNOSIS — F419 Anxiety disorder, unspecified: Secondary | ICD-10-CM

## 2017-06-20 MED ORDER — ALPRAZOLAM 0.5 MG PO TABS
0.50 | ORAL_TABLET | ORAL | Status: DC
Start: ? — End: 2017-06-20

## 2017-06-20 MED ORDER — HYDROXYZINE HCL 50 MG PO TABS
50.00 | ORAL_TABLET | ORAL | Status: DC
Start: ? — End: 2017-06-20

## 2017-06-20 MED ORDER — ENOXAPARIN SODIUM 40 MG/0.4ML ~~LOC~~ SOLN
40.00 | SUBCUTANEOUS | Status: DC
Start: 2017-06-21 — End: 2017-06-20

## 2017-06-20 MED ORDER — GENERIC EXTERNAL MEDICATION
Status: DC
Start: ? — End: 2017-06-20

## 2017-06-20 MED ORDER — ONDANSETRON HCL 8 MG PO TABS
8.00 | ORAL_TABLET | ORAL | Status: DC
Start: ? — End: 2017-06-20

## 2017-06-20 MED ORDER — ALPRAZOLAM 1 MG PO TABS
1.00 | ORAL_TABLET | ORAL | Status: DC
Start: 2017-06-20 — End: 2017-06-20

## 2017-06-20 MED ORDER — POTASSIUM CHLORIDE 20 MEQ PO PACK
PACK | ORAL | Status: DC
Start: 2017-06-20 — End: 2017-06-20

## 2017-06-20 MED ORDER — MUPIROCIN 2 % EX OINT
TOPICAL_OINTMENT | CUTANEOUS | Status: DC
Start: 2017-06-20 — End: 2017-06-20

## 2017-06-20 MED ORDER — ALPRAZOLAM 0.5 MG PO TABS
0.50 | ORAL_TABLET | ORAL | Status: DC
Start: 2017-06-20 — End: 2017-06-20

## 2017-06-20 MED ORDER — MOXIFLOXACIN HCL 400 MG PO TABS
400.00 | ORAL_TABLET | ORAL | Status: DC
Start: 2017-06-20 — End: 2017-06-20

## 2017-06-20 MED ORDER — LOPERAMIDE HCL 2 MG PO CAPS
2.00 | ORAL_CAPSULE | ORAL | Status: DC
Start: ? — End: 2017-06-20

## 2017-06-20 MED ORDER — GENERIC EXTERNAL MEDICATION
20.00 | Status: DC
Start: ? — End: 2017-06-20

## 2017-06-20 MED ORDER — PANTOPRAZOLE SODIUM 40 MG PO TBEC
40.00 | DELAYED_RELEASE_TABLET | ORAL | Status: DC
Start: 2017-06-21 — End: 2017-06-20

## 2017-06-20 MED ORDER — NICOTINE 21 MG/24HR TD PT24
1.00 | MEDICATED_PATCH | TRANSDERMAL | Status: DC
Start: 2017-06-21 — End: 2017-06-20

## 2017-06-20 MED ORDER — GENERIC EXTERNAL MEDICATION
150.00 | Status: DC
Start: ? — End: 2017-06-20

## 2017-06-20 MED ORDER — VENLAFAXINE HCL ER 75 MG PO CP24
75.00 | ORAL_CAPSULE | ORAL | Status: DC
Start: 2017-06-21 — End: 2017-06-20

## 2017-06-20 MED ORDER — TOPIRAMATE 100 MG PO TABS
200.00 | ORAL_TABLET | ORAL | Status: DC
Start: 2017-06-21 — End: 2017-06-20

## 2017-08-09 DIAGNOSIS — Z79891 Long term (current) use of opiate analgesic: Secondary | ICD-10-CM | POA: Diagnosis not present

## 2017-08-09 DIAGNOSIS — M542 Cervicalgia: Secondary | ICD-10-CM | POA: Diagnosis not present

## 2017-08-09 DIAGNOSIS — M545 Low back pain: Secondary | ICD-10-CM | POA: Diagnosis not present

## 2017-08-09 DIAGNOSIS — G894 Chronic pain syndrome: Secondary | ICD-10-CM | POA: Diagnosis not present

## 2017-08-28 DIAGNOSIS — J302 Other seasonal allergic rhinitis: Secondary | ICD-10-CM | POA: Diagnosis not present

## 2017-08-28 DIAGNOSIS — R5383 Other fatigue: Secondary | ICD-10-CM | POA: Diagnosis not present

## 2017-08-28 DIAGNOSIS — L299 Pruritus, unspecified: Secondary | ICD-10-CM | POA: Diagnosis not present

## 2017-08-28 DIAGNOSIS — L409 Psoriasis, unspecified: Secondary | ICD-10-CM | POA: Diagnosis not present

## 2017-09-06 DIAGNOSIS — M542 Cervicalgia: Secondary | ICD-10-CM | POA: Diagnosis not present

## 2017-09-06 DIAGNOSIS — G894 Chronic pain syndrome: Secondary | ICD-10-CM | POA: Diagnosis not present

## 2017-09-06 DIAGNOSIS — Z79891 Long term (current) use of opiate analgesic: Secondary | ICD-10-CM | POA: Diagnosis not present

## 2017-09-06 DIAGNOSIS — M545 Low back pain: Secondary | ICD-10-CM | POA: Diagnosis not present

## 2017-09-17 DIAGNOSIS — Z01419 Encounter for gynecological examination (general) (routine) without abnormal findings: Secondary | ICD-10-CM | POA: Diagnosis not present

## 2017-09-17 DIAGNOSIS — Z6823 Body mass index (BMI) 23.0-23.9, adult: Secondary | ICD-10-CM | POA: Diagnosis not present

## 2017-09-17 DIAGNOSIS — Z1231 Encounter for screening mammogram for malignant neoplasm of breast: Secondary | ICD-10-CM | POA: Diagnosis not present

## 2017-09-17 DIAGNOSIS — R350 Frequency of micturition: Secondary | ICD-10-CM | POA: Diagnosis not present

## 2017-10-01 DIAGNOSIS — G4700 Insomnia, unspecified: Secondary | ICD-10-CM | POA: Diagnosis not present

## 2017-10-01 DIAGNOSIS — L409 Psoriasis, unspecified: Secondary | ICD-10-CM | POA: Diagnosis not present

## 2017-10-01 DIAGNOSIS — Z79899 Other long term (current) drug therapy: Secondary | ICD-10-CM | POA: Diagnosis not present

## 2017-10-03 DIAGNOSIS — M545 Low back pain: Secondary | ICD-10-CM | POA: Diagnosis not present

## 2017-10-03 DIAGNOSIS — M542 Cervicalgia: Secondary | ICD-10-CM | POA: Diagnosis not present

## 2017-10-03 DIAGNOSIS — Z79891 Long term (current) use of opiate analgesic: Secondary | ICD-10-CM | POA: Diagnosis not present

## 2017-10-03 DIAGNOSIS — G894 Chronic pain syndrome: Secondary | ICD-10-CM | POA: Diagnosis not present

## 2018-11-21 ENCOUNTER — Other Ambulatory Visit: Payer: Self-pay

## 2018-11-21 ENCOUNTER — Emergency Department (HOSPITAL_COMMUNITY): Payer: Medicare Other

## 2018-11-21 ENCOUNTER — Emergency Department (HOSPITAL_COMMUNITY)
Admission: EM | Admit: 2018-11-21 | Discharge: 2018-11-21 | Disposition: A | Discharge status: Home / Self Care | Payer: Medicare Other | Attending: Emergency Medicine | Admitting: Emergency Medicine

## 2018-11-21 ENCOUNTER — Encounter (HOSPITAL_COMMUNITY): Payer: Self-pay | Admitting: Emergency Medicine

## 2018-11-21 DIAGNOSIS — Y92410 Unspecified street and highway as the place of occurrence of the external cause: Secondary | ICD-10-CM | POA: Diagnosis not present

## 2018-11-21 DIAGNOSIS — Y999 Unspecified external cause status: Secondary | ICD-10-CM | POA: Insufficient documentation

## 2018-11-21 DIAGNOSIS — S5011XA Contusion of right forearm, initial encounter: Secondary | ICD-10-CM | POA: Diagnosis not present

## 2018-11-21 DIAGNOSIS — S20212A Contusion of left front wall of thorax, initial encounter: Secondary | ICD-10-CM | POA: Insufficient documentation

## 2018-11-21 DIAGNOSIS — S301XXA Contusion of abdominal wall, initial encounter: Secondary | ICD-10-CM | POA: Diagnosis not present

## 2018-11-21 DIAGNOSIS — Y93I9 Activity, other involving external motion: Secondary | ICD-10-CM | POA: Diagnosis not present

## 2018-11-21 DIAGNOSIS — R4182 Altered mental status, unspecified: Secondary | ICD-10-CM | POA: Insufficient documentation

## 2018-11-21 LAB — I-STAT BETA HCG BLOOD, ED (MC, WL, AP ONLY): I-stat hCG, quantitative: <5 m[IU]/mL (ref <5)

## 2018-11-21 LAB — COMPREHENSIVE METABOLIC PANEL
ALT: 13 U/L (ref 0–44)
AST: 21 U/L (ref 15–41)
Albumin: 3.3 g/dL — ABNORMAL LOW (ref 3.5–5.0)
Alkaline Phosphatase: 63 U/L (ref 38–126)
Anion gap: 7 (ref 5–15)
BUN: 13 mg/dL (ref 6–20)
CO2: 22 mmol/L (ref 22–32)
Calcium: 8.3 mg/dL — ABNORMAL LOW (ref 8.9–10.3)
Chloride: 109 mmol/L (ref 98–111)
Creatinine, Ser: 0.99 mg/dL (ref 0.44–1.00)
GFR calc Af Amer: >60 mL/min (ref >60)
GFR calc non Af Amer: >60 mL/min (ref >60)
Glucose, Bld: 103 mg/dL — ABNORMAL HIGH (ref 70–99)
Potassium: 4.4 mmol/L (ref 3.5–5.1)
Sodium: 138 mmol/L (ref 135–145)
Total Bilirubin: 0.3 mg/dL (ref 0.3–1.2)
Total Protein: 5.9 g/dL — ABNORMAL LOW (ref 6.5–8.1)

## 2018-11-21 LAB — CBC
HCT: 43.5 % (ref 36.0–46.0)
Hemoglobin: 13.7 g/dL (ref 12.0–15.0)
MCH: 29.6 pg (ref 26.0–34.0)
MCHC: 31.5 g/dL (ref 30.0–36.0)
MCV: 94 fL (ref 80.0–100.0)
Platelets: 228 10*3/uL (ref 150–400)
RBC: 4.63 MIL/uL (ref 3.87–5.11)
RDW: 14.3 % (ref 11.5–15.5)
WBC: 8.9 10*3/uL (ref 4.0–10.5)
nRBC: 0 % (ref 0.0–0.2)

## 2018-11-21 LAB — I-STAT CHEM 8, ED
BUN: 15 mg/dL (ref 6–20)
Calcium, Ion: 1.12 mmol/L — ABNORMAL LOW (ref 1.15–1.40)
Chloride: 110 mmol/L (ref 98–111)
Creatinine, Ser: 0.9 mg/dL (ref 0.44–1.00)
Glucose, Bld: 99 mg/dL (ref 70–99)
HCT: 43 % (ref 36.0–46.0)
Hemoglobin: 14.6 g/dL (ref 12.0–15.0)
Potassium: 4.4 mmol/L (ref 3.5–5.1)
Sodium: 140 mmol/L (ref 135–145)
TCO2: 22 mmol/L (ref 22–32)

## 2018-11-21 LAB — ETHANOL: Alcohol, Ethyl (B): <10 mg/dL (ref <10)

## 2018-11-21 LAB — LACTIC ACID, PLASMA: Lactic Acid, Venous: 0.9 mmol/L (ref 0.5–1.9)

## 2018-11-21 LAB — PROTIME-INR
INR: 1 (ref 0.8–1.2)
Prothrombin Time: 12.7 seconds (ref 11.4–15.2)

## 2018-11-21 LAB — SAMPLE TO BLOOD BANK

## 2018-11-21 LAB — CDS SEROLOGY

## 2018-11-21 MED ORDER — IOHEXOL 300 MG/ML  SOLN
100.0000 mL | Freq: Once | INTRAMUSCULAR | Status: AC | PRN
  Administered 2018-11-21: 15:00:00 100 mL via INTRAVENOUS

## 2018-11-21 NOTE — ED Notes (Signed)
CT called. Will take pt to CT as soon as ready

## 2018-11-21 NOTE — ED Notes (Signed)
Pt verbalized understanding of d/c instructions. [employer] called for pt. Pt still sleepy but able to ambulate, eat and drink without difficulty. This RN walked with pt to bench in front of ER to wait for taxi. VSS. NAD

## 2018-11-21 NOTE — ED Notes (Signed)
Patient transported to CT 
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
## 2018-11-21 NOTE — ED Notes (Signed)
Pt returned from CT °
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
## 2018-11-21 NOTE — ED Notes (Signed)
Portable in room.  

## 2018-11-21 NOTE — Progress Notes (Signed)
Orthopedic Tech Progress Note Patient Details:  [name] 14-Feb-1966 [id number]  Patient ID: Stephanie Gregory, female   DOB: 09/04/1965, 53 y.o.   MRN: [id number]   [place] Pink 11/21/2018, 3:06 PMLevel 2 Trauma

## 2018-11-21 NOTE — Discharge Instructions (Addendum)
Continue taking your medications as needed for pain.  Apply ice to affected areas for 20 minutes every 2 hours while awake for the next 2 days.  Follow-up with your primary doctor if symptoms not improving in the next few days.
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
## 2018-11-21 NOTE — ED Notes (Signed)
CT called and told pt needs to go to CT asap

## 2018-11-22 ENCOUNTER — Emergency Department (HOSPITAL_COMMUNITY): Payer: Medicare Other

## 2018-11-22 ENCOUNTER — Emergency Department (HOSPITAL_COMMUNITY)
Admission: EM | Admit: 2018-11-22 | Discharge: 2018-11-22 | Disposition: A | Discharge status: Home / Self Care | Payer: Medicare Other | Attending: Emergency Medicine | Admitting: Emergency Medicine

## 2018-11-22 ENCOUNTER — Other Ambulatory Visit: Payer: Self-pay

## 2018-11-22 ENCOUNTER — Encounter (HOSPITAL_COMMUNITY): Payer: Self-pay | Admitting: *Deleted

## 2018-11-22 DIAGNOSIS — R4182 Altered mental status, unspecified: Secondary | ICD-10-CM | POA: Diagnosis present

## 2018-11-22 DIAGNOSIS — F17200 Nicotine dependence, unspecified, uncomplicated: Secondary | ICD-10-CM | POA: Diagnosis not present

## 2018-11-22 DIAGNOSIS — R400 Somnolence: Secondary | ICD-10-CM | POA: Insufficient documentation

## 2018-11-22 DIAGNOSIS — Z79899 Other long term (current) drug therapy: Secondary | ICD-10-CM | POA: Insufficient documentation

## 2018-11-22 LAB — COMPREHENSIVE METABOLIC PANEL
ALT: 18 U/L (ref 0–44)
AST: 33 U/L (ref 15–41)
Albumin: 3.7 g/dL (ref 3.5–5.0)
Alkaline Phosphatase: 69 U/L (ref 38–126)
Anion gap: 9 (ref 5–15)
BUN: 10 mg/dL (ref 6–20)
CO2: 23 mmol/L (ref 22–32)
Calcium: 8.5 mg/dL — ABNORMAL LOW (ref 8.9–10.3)
Chloride: 107 mmol/L (ref 98–111)
Creatinine, Ser: 1 mg/dL (ref 0.44–1.00)
GFR calc Af Amer: >60 mL/min (ref >60)
GFR calc non Af Amer: >60 mL/min (ref >60)
Glucose, Bld: 94 mg/dL (ref 70–99)
Potassium: 4 mmol/L (ref 3.5–5.1)
Sodium: 139 mmol/L (ref 135–145)
Total Bilirubin: 0.5 mg/dL (ref 0.3–1.2)
Total Protein: 6.6 g/dL (ref 6.5–8.1)

## 2018-11-22 LAB — URINALYSIS, ROUTINE W REFLEX MICROSCOPIC
Bilirubin Urine: NEGATIVE
Glucose, UA: NEGATIVE mg/dL
Hgb urine dipstick: NEGATIVE
Ketones, ur: NEGATIVE mg/dL
Leukocytes,Ua: NEGATIVE
Nitrite: NEGATIVE
Protein, ur: NEGATIVE mg/dL
Specific Gravity, Urine: 1.018 (ref 1.005–1.030)
pH: 6 (ref 5.0–8.0)

## 2018-11-22 LAB — ETHANOL: Alcohol, Ethyl (B): <10 mg/dL (ref <10)

## 2018-11-22 LAB — CBC WITH DIFFERENTIAL/PLATELET
Abs Immature Granulocytes: 0.06 10*3/uL (ref 0.00–0.07)
Basophils Absolute: 0.1 10*3/uL (ref 0.0–0.1)
Basophils Relative: 1 %
Eosinophils Absolute: 0.2 10*3/uL (ref 0.0–0.5)
Eosinophils Relative: 2 %
HCT: 45.8 % (ref 36.0–46.0)
Hemoglobin: 14.1 g/dL (ref 12.0–15.0)
Immature Granulocytes: 1 %
Lymphocytes Relative: 16 %
Lymphs Abs: 1.6 10*3/uL (ref 0.7–4.0)
MCH: 29.4 pg (ref 26.0–34.0)
MCHC: 30.8 g/dL (ref 30.0–36.0)
MCV: 95.4 fL (ref 80.0–100.0)
Monocytes Absolute: 0.8 10*3/uL (ref 0.1–1.0)
Monocytes Relative: 8 %
Neutro Abs: 7.6 10*3/uL (ref 1.7–7.7)
Neutrophils Relative %: 72 %
Platelets: 216 10*3/uL (ref 150–400)
RBC: 4.8 MIL/uL (ref 3.87–5.11)
RDW: 14.4 % (ref 11.5–15.5)
WBC: 10.4 10*3/uL (ref 4.0–10.5)
nRBC: 0 % (ref 0.0–0.2)

## 2018-11-22 LAB — RAPID URINE DRUG SCREEN, HOSP PERFORMED
Amphetamines: NOT DETECTED
Barbiturates: NOT DETECTED
Benzodiazepines: POSITIVE — AB
Cocaine: NOT DETECTED
Opiates: POSITIVE — AB
Tetrahydrocannabinol: NOT DETECTED

## 2018-11-22 LAB — CBG MONITORING, ED: Glucose-Capillary: 85 mg/dL (ref 70–99)

## 2018-11-22 LAB — SALICYLATE LEVEL: Salicylate Lvl: <7 mg/dL (ref 2.8–30.0)

## 2018-11-22 LAB — ACETAMINOPHEN LEVEL: Acetaminophen (Tylenol), Serum: <10 ug/mL — ABNORMAL LOW (ref 10–30)

## 2018-11-22 MED ORDER — NALOXONE HCL 2 MG/2ML IJ SOSY
1.0000 mg | PREFILLED_SYRINGE | Freq: Once | INTRAMUSCULAR | Status: AC
  Administered 2018-11-22: 1 mg via INTRAVENOUS
  Filled 2018-11-22: qty 2

## 2018-11-22 MED ORDER — SODIUM CHLORIDE 0.9 % IV BOLUS (SEPSIS)
1000.0000 mL | Freq: Once | INTRAVENOUS | Status: AC
  Administered 2018-11-22: 01:00:00 1000 mL via INTRAVENOUS

## 2018-11-22 MED ORDER — MORPHINE SULFATE (PF) 4 MG/ML IV SOLN: 4.0000 mg | Freq: Once | INTRAVENOUS | Status: DC

## 2018-11-22 MED ORDER — KETOROLAC TROMETHAMINE 30 MG/ML IJ SOLN
30.0000 mg | Freq: Once | INTRAMUSCULAR | Status: AC
  Administered 2018-11-22: 04:00:00 30 mg via INTRAVENOUS
  Filled 2018-11-22: qty 1
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
## 2018-11-22 NOTE — ED Notes (Signed)
Pt  More alert after narcan  Given iv I asked if she had taken any pain meds  She replied that she had taken oxy and roxi  Pt c//o a headache and some chest pain.  Bruises across her chest

## 2018-11-22 NOTE — Discharge Instructions (Signed)
You came in unresponsive because you took too much Xanax and opiates.  I recommended you follow-up with your doctor to adjust his medications.  You should not drive while taking his medications as you are a danger to yourself and others on the road.

## 2018-11-22 NOTE — ED Notes (Signed)
To ct

## 2018-11-22 NOTE — ED Notes (Signed)
Patient transported to CT 

## 2018-11-22 NOTE — ED Notes (Signed)
Pt sleeping soundly.
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
## 2018-11-22 NOTE — ED Notes (Signed)
Pt became more responsive after the narcan iv  C/o a sudden headache

## 2018-11-24 ENCOUNTER — Encounter (HOSPITAL_COMMUNITY): Payer: Self-pay | Admitting: Emergency Medicine

## 2019-11-16 ENCOUNTER — Telehealth: Payer: Self-pay | Admitting: Gastroenterology

## 2019-11-16 NOTE — Telephone Encounter (Signed)
Hey Dr Lyndel Safe, this pt is requesting you to take them on as a new pt, pt had a colonoscopy done at [facility] 07/2018. Pt is experiencing stomach issues which he would like to establish with [place].

## 2019-11-18 NOTE — Telephone Encounter (Signed)
No problems Can schedule with me or Colleen, whichever is faster Thx  RG

## 2019-11-27 NOTE — Telephone Encounter (Signed)
Attempted to contact pt with no success, phone rings and goes straight to busy tone.
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
## 2020-08-09 ENCOUNTER — Encounter: Payer: Self-pay | Admitting: Gastroenterology

## 2020-09-08 ENCOUNTER — Ambulatory Visit: Payer: BLUE CROSS/BLUE SHIELD | Admitting: Gastroenterology

## 2021-11-30 ENCOUNTER — Emergency Department (HOSPITAL_COMMUNITY)
Admission: EM | Admit: 2021-11-30 | Discharge: 2021-11-30 | Disposition: A | Discharge status: Home / Self Care | Payer: Medicare HMO | Attending: Emergency Medicine | Admitting: Emergency Medicine

## 2021-11-30 ENCOUNTER — Encounter (HOSPITAL_COMMUNITY): Payer: Self-pay

## 2021-11-30 ENCOUNTER — Other Ambulatory Visit: Payer: Self-pay

## 2021-11-30 DIAGNOSIS — T43651A Poisoning by methamphetamines accidental (unintentional), initial encounter: Secondary | ICD-10-CM | POA: Insufficient documentation

## 2021-11-30 DIAGNOSIS — Z85048 Personal history of other malignant neoplasm of rectum, rectosigmoid junction, and anus: Secondary | ICD-10-CM | POA: Diagnosis not present

## 2021-11-30 DIAGNOSIS — F419 Anxiety disorder, unspecified: Secondary | ICD-10-CM | POA: Insufficient documentation

## 2021-11-30 DIAGNOSIS — F1910 Other psychoactive substance abuse, uncomplicated: Secondary | ICD-10-CM

## 2021-11-30 LAB — RAPID URINE DRUG SCREEN, HOSP PERFORMED
Amphetamines: POSITIVE — AB
Barbiturates: NOT DETECTED
Benzodiazepines: POSITIVE — AB
Cocaine: NOT DETECTED
Opiates: NOT DETECTED
Tetrahydrocannabinol: NOT DETECTED

## 2021-11-30 LAB — CBC WITH DIFFERENTIAL/PLATELET
Abs Immature Granulocytes: 0.06 10*3/uL (ref 0.00–0.07)
Basophils Absolute: 0.1 10*3/uL (ref 0.0–0.1)
Basophils Relative: 1 %
Eosinophils Absolute: 0 10*3/uL (ref 0.0–0.5)
Eosinophils Relative: 0 %
HCT: 36.4 % (ref 36.0–46.0)
Hemoglobin: 12.4 g/dL (ref 12.0–15.0)
Immature Granulocytes: 1 %
Lymphocytes Relative: 9 %
Lymphs Abs: 0.9 10*3/uL (ref 0.7–4.0)
MCH: 29.2 pg (ref 26.0–34.0)
MCHC: 34.1 g/dL (ref 30.0–36.0)
MCV: 85.6 fL (ref 80.0–100.0)
Monocytes Absolute: 0.6 10*3/uL (ref 0.1–1.0)
Monocytes Relative: 6 %
Neutro Abs: 8.3 10*3/uL — ABNORMAL HIGH (ref 1.7–7.7)
Neutrophils Relative %: 83 %
Platelets: 188 10*3/uL (ref 150–400)
RBC: 4.25 MIL/uL (ref 3.87–5.11)
RDW: 13.5 % (ref 11.5–15.5)
WBC: 10 10*3/uL (ref 4.0–10.5)
nRBC: 0 % (ref 0.0–0.2)

## 2021-11-30 LAB — COMPREHENSIVE METABOLIC PANEL
ALT: 15 U/L (ref 0–44)
AST: 33 U/L (ref 15–41)
Albumin: 3.9 g/dL (ref 3.5–5.0)
Alkaline Phosphatase: 64 U/L (ref 38–126)
Anion gap: 9 (ref 5–15)
BUN: 16 mg/dL (ref 6–20)
CO2: 21 mmol/L — ABNORMAL LOW (ref 22–32)
Calcium: 8.5 mg/dL — ABNORMAL LOW (ref 8.9–10.3)
Chloride: 105 mmol/L (ref 98–111)
Creatinine, Ser: 1.06 mg/dL — ABNORMAL HIGH (ref 0.44–1.00)
GFR, Estimated: >60 mL/min (ref >60)
Glucose, Bld: 104 mg/dL — ABNORMAL HIGH (ref 70–99)
Potassium: 3 mmol/L — ABNORMAL LOW (ref 3.5–5.1)
Sodium: 135 mmol/L (ref 135–145)
Total Bilirubin: 0.6 mg/dL (ref 0.3–1.2)
Total Protein: 6.5 g/dL (ref 6.5–8.1)

## 2021-11-30 LAB — ETHANOL: Alcohol, Ethyl (B): <10 mg/dL (ref <10)

## 2021-11-30 MED ORDER — DIPHENHYDRAMINE HCL 50 MG/ML IJ SOLN
12.5000 mg | Freq: Once | INTRAMUSCULAR | Status: AC
  Administered 2021-11-30: 12.5 mg via INTRAVENOUS
  Filled 2021-11-30: qty 1

## 2021-11-30 MED ORDER — LORAZEPAM 2 MG/ML IJ SOLN: 1.0000 mg | Freq: Once | INTRAMUSCULAR | Status: DC

## 2021-11-30 MED ORDER — DROPERIDOL 2.5 MG/ML IJ SOLN
2.5000 mg | Freq: Once | INTRAMUSCULAR | Status: AC
Start: 2021-11-30 — End: 2021-11-30
  Administered 2021-11-30: 2.5 mg via INTRAMUSCULAR
  Filled 2021-11-30: qty 2

## 2021-11-30 MED ORDER — SODIUM CHLORIDE 0.9 % IV BOLUS
500.0000 mL | Freq: Once | INTRAVENOUS | Status: AC
  Administered 2021-11-30: 500 mL via INTRAVENOUS

## 2021-11-30 MED ORDER — SODIUM CHLORIDE 0.9 % IV BOLUS
1000.0000 mL | Freq: Once | INTRAVENOUS | Status: AC
  Administered 2021-11-30: 1000 mL via INTRAVENOUS

## 2021-11-30 MED ORDER — DIPHENHYDRAMINE HCL 50 MG/ML IJ SOLN
12.5000 mg | Freq: Once | INTRAMUSCULAR | Status: AC
Start: 2021-11-30 — End: 2021-11-30
  Administered 2021-11-30: 12.5 mg via INTRAVENOUS
  Filled 2021-11-30: qty 1

## 2021-11-30 MED ORDER — HALOPERIDOL LACTATE 5 MG/ML IJ SOLN
5.0000 mg | Freq: Once | INTRAMUSCULAR | Status: AC
  Administered 2021-11-30: 5 mg via INTRAMUSCULAR
  Filled 2021-11-30: qty 1

## 2021-11-30 MED ORDER — POTASSIUM CHLORIDE CRYS ER 20 MEQ PO TBCR
40.0000 meq | EXTENDED_RELEASE_TABLET | Freq: Once | ORAL | Status: AC
  Administered 2021-11-30: 40 meq via ORAL
  Filled 2021-11-30: qty 2

## 2021-11-30 MED ORDER — LORAZEPAM 1 MG PO TABS
1.0000 mg | ORAL_TABLET | Freq: Once | ORAL | Status: AC
  Administered 2021-11-30: 1 mg via ORAL
  Filled 2021-11-30: qty 1

## 2021-11-30 MED ORDER — LORAZEPAM 2 MG/ML IJ SOLN
1.0000 mg | Freq: Once | INTRAMUSCULAR | Status: AC
Start: 2021-11-30 — End: 2021-11-30
  Administered 2021-11-30: 1 mg via INTRAVENOUS
  Filled 2021-11-30: qty 1

## 2021-11-30 NOTE — ED Notes (Signed)
Pt will not remain in her room. She was given a sandwhich and drink by me

## 2021-11-30 NOTE — ED Notes (Signed)
Tried multiple times, pt will not sit still for BP

## 2021-11-30 NOTE — ED Notes (Signed)
Medication not scanned and bracelet not scanned due to pt not being still and computer in room not working.

## 2021-11-30 NOTE — ED Notes (Signed)
Pt will not stay still for any vital signs

## 2021-11-30 NOTE — ED Provider Notes (Signed)
  Physical Exam  BP (!) 140/81 (BP Location: Left Arm)   Pulse 68   Temp 100 F (37.8 C) (Oral)   Resp 20   LMP 06/09/2013   SpO2 98%   Physical Exam  Procedures  Procedures  ED Course / MDM    Medical Decision Making Amount and/or Complexity of Data Reviewed Labs: ordered.  Risk Prescription drug management.   Patient signed out to me at shift change pending reevaluation.  Please see HPI for further details.  In short, this is a 56 year old female with a history of anxiety, GERD, migraines, degenerative disc disease, panic attacks, anal cancer, endometriosis, polysubstance abuse who presents to the ED complaining of anxiety after ingesting drugs.  Patient reports just getting out of rehab, states that she took a pill from someone that told her it was for alcohol withdrawal.  Patient states after ingesting this pill she became increasingly agitated and cannot calm himself down, sit still.  Previous provider reports that the patient received 4 mg of Versed prior to the ER arrival which did not alleviate her restlessness.  On my initial examination, patient was still complaining of what appeared to be asked that she is, stating she could not sit still.  Patient was contracting her hands, flexing and extending her extremities stating that she was uncomfortable.  1 mg IV Ativan was provided at this time as well as 12.5 mg of Benadryl.  After these medications were given, the patient was showing the ability to sleep calmly in the ED.  The patient is no longer complaining of restlessness.  The patient is extremely lethargic so we will allow her to rest in the department before discharging her.    Update: Patient now alert and oriented.  Patient has sister at bedside who will be driving the patient home.  Patient requesting discharge.  The patient will be discharged home and advised to follow-up with PCP.  The patient was offered drug abuse counseling resources which she deferred.  The  patient was advised to not take medications that she has not been prescribed in the future.  The patient voiced understanding of my instructions.  The patient was given return precautions and she voiced understanding of these.  The patient is stable for discharge at this time.       Azucena Cecil, PA-C 11/30/21 1340    Lajean Saver, MD 11/30/21 1446

## 2021-11-30 NOTE — ED Notes (Signed)
Pt has removed the sheetz from the mattress several times and I have corrected it several times but she will not be still and removes it again and again

## 2021-11-30 NOTE — ED Notes (Signed)
Pt finally put clothes back on

## 2021-11-30 NOTE — ED Notes (Addendum)
Pt has now taken off her clothes and refuses to put them back on

## 2021-11-30 NOTE — ED Notes (Signed)
Pt is asking for a "shot of ativan" and I advised we would need to wait on the doctor to see her

## 2021-11-30 NOTE — ED Provider Notes (Signed)
[facility] DEPT Provider Note   CSN: [id number] Arrival date & time: 11/30/21  3545     History  Chief Complaint  Patient presents with   Drug Problem    Stephanie Gregory is a 56 y.o. female with history of anxiety, GERD, migraines, degenerative disc disease, panic attacks, anal cancer, endometriosis, polysubstance abuse who presents the emergency department complaining of anxiety after drug ingestion.  Patient states that she just got out of rehab, and had taken a pill from someone that told her it was for alcohol withdrawal.  States that the pill was yellow.  After ingesting it patient became increasingly agitated, and could not calm herself down.  EMS reports giving her 4 mg of Versed prior to ER arrival.  Patient states that "everything hurts", but cannot localize pain to any specific place.  She denies SI/HI, AVH.   Drug Problem Pertinent negatives include no chest pain and no shortness of breath.       Home Medications Prior to Admission medications   Medication Sig Start Date End Date Taking? Authorizing Provider  ALPRAZolam Duanne Moron) 1 MG tablet Take 1 tablet by mouth 3 (three) times daily.    [provider]  docusate sodium (COLACE) 100 MG capsule Take 200 mg by mouth daily.     [provider]  esomeprazole (NEXIUM) 40 MG capsule as needed. 09/21/16   [provider]  estradiol (ESTRACE) 2 MG tablet Take 2 mg by mouth daily. 10/05/21   [provider]  fluticasone (FLONASE) 50 MCG/ACT nasal spray Place 2 sprays into both nostrils daily as needed.    [provider]  gabapentin (NEURONTIN) 600 MG tablet Take 600 mg by mouth at bedtime.    [provider]  HORIZANT 600 MG TBCR Take 1 tablet by mouth 2 (two) times daily. 09/17/16   [provider]  lidocaine (LIDODERM) 5 % Place 1 patch onto the skin daily. Remove & Discard patch within 12 hours or as directed by MD    [provider]  Melatonin 1 MG TABS Take 1 mg by mouth at bedtime.    [provider]  montelukast (SINGULAIR) 10 MG tablet Take 1 tablet (10 mg total) by mouth at bedtime. 03/09/15   Silverio Decamp, MD  MOVANTIK 25 MG TABS tablet Take 1 tablet by mouth daily. 09/17/16   [provider]  Naldemedine Tosylate (SYMPROIC) 0.2 MG TABS Take 0.2 mg by mouth daily. 10/09/16   [provider]  NARCAN 4 MG/0.1ML LIQD nasal spray kit Place into the nose as needed.  07/26/16   [provider]  ondansetron (ZOFRAN) 8 MG tablet Take 8 mg by mouth as needed for nausea.     [provider]  ondansetron (ZOFRAN-ODT) 8 MG disintegrating tablet Take 8 mg by mouth every 8 (eight) hours as needed. 11/09/21   [provider]  oxycodone (ROXICODONE) 30 MG immediate release tablet Take 30 mg by mouth every 4 (four) hours as needed for pain.    [provider]  PROCTOZONE-HC 2.5 % rectal cream Place 1 application rectally daily as needed for hemorrhoids or itching.  08/19/14   [provider]  rOPINIRole (REQUIP) 1 MG tablet Take 1 tablet (1 mg total) by mouth at bedtime. 09/23/14   Silverio Decamp, MD  silver sulfADIAZINE (SILVADENE) 1 % cream Apply 1 application topically 2 (two) times daily.    [provider]  SUMAtriptan (IMITREX) 100 MG tablet Take 100  mg by mouth as needed.  09/07/16   [provider]  SUMAtriptan 6 MG/0.5ML SOAJ Inject 1 Dose into the muscle as needed.    [provider]  topiramate (TOPAMAX) 200 MG tablet Take 1 tablet (200 mg total) by mouth daily. 04/22/15   Silverio Decamp, MD  traZODone (DESYREL) 150 MG tablet TAKE TWO TABLETS BY MOUTH EVERY NIGHT AT BEDTIME Patient taking differently: TAKE (231m) BY MOUTH EVERY NIGHT AT BEDTIME 10/31/15   TSilverio Decamp MD  triamcinolone cream (KENALOG) 0.5 % triamcinolone acetonide 0.5 % topical cream    [provider]   venlafaxine XR (EFFEXOR XR) 150 MG 24 hr capsule Take 1 tablet by mouth daily.    [provider]  venlafaxine XR (EFFEXOR-XR) 75 MG 24 hr capsule TAKE 1 CAPSULE BY MOUTH DAILY WITH 150MGDOSE Patient taking differently: TAKE 1 CAPSULE BY MOUTH DAILY WITH 150MGDOSE plus 75 mg to equal 2244m6/23/17   MeHali MarryMD  XTAMPZA ER 27 MG C12A Take 1 tablet by mouth 2 (two) times daily. 10/16/16   [provider]      Allergies    Bactrim [sulfamethoxazole-trimethoprim], Cyclobenzaprine, Methocarbamol, Bactrim [sulfamethoxazole-trimethoprim], and Klonopin [clonazepam]    Review of Systems   Review of Systems  Respiratory:  Negative for shortness of breath.   Cardiovascular:  Negative for chest pain.  Gastrointestinal:  Negative for nausea and vomiting.  Musculoskeletal:  Positive for myalgias.  Psychiatric/Behavioral:  Positive for agitation. Negative for hallucinations and suicidal ideas. The patient is nervous/anxious.   All other systems reviewed and are negative.   Physical Exam Updated Vital Signs BP (!) 145/133 (BP Location: Right Arm)   Pulse 89   Temp 98.7 F (37.1 C) (Oral)   Resp 20   LMP 06/09/2013   SpO2 96%  Physical Exam Vitals and nursing note reviewed.  Constitutional:      Comments: Disheveled appearance.  Patient having difficulty sitting still.  Consistently walking around the exam room.  Repeatedly taking her clothes off and sitting/rolling on the floor.  HENT:     Head: Normocephalic and atraumatic.  Eyes:     Conjunctiva/sclera: Conjunctivae normal.  Pulmonary:     Effort: Pulmonary effort is normal. No respiratory distress.  Skin:    General: Skin is warm and dry.  Neurological:     Mental Status: She is alert.  Psychiatric:        Attention and Perception: She does not perceive auditory or visual hallucinations.        Mood and Affect: Mood normal. Affect is tearful.        Speech: Speech is rapid and pressured.         Behavior: Behavior is agitated and hyperactive.        Thought Content: Thought content does not include homicidal or suicidal ideation.     ED Results / Procedures / Treatments   Labs (all labs ordered are listed, but only abnormal results are displayed) Labs Reviewed  RAPID URINE DRUG SCREEN, HOSP PERFORMED - Abnormal; Notable for the following components:      Result Value   Benzodiazepines POSITIVE (*)    Amphetamines POSITIVE (*)    All other components within normal limits  COMPREHENSIVE METABOLIC PANEL - Abnormal; Notable for the following components:   Potassium 3.0 (*)    CO2 21 (*)    Glucose, Bld 104 (*)    Creatinine, Ser 1.06 (*)    Calcium 8.5 (*)  All other components within normal limits  CBC WITH DIFFERENTIAL/PLATELET - Abnormal; Notable for the following components:   Neutro Abs 8.3 (*)    All other components within normal limits  ETHANOL    EKG None  Radiology No results found.  Procedures Procedures    Medications Ordered in ED Medications  LORazepam (ATIVAN) tablet 1 mg (has no administration in time range)  droperidol (INAPSINE) 2.5 MG/ML injection 2.5 mg (2.5 mg Intramuscular Given 11/30/21 0238)  diphenhydrAMINE (BENADRYL) injection 12.5 mg (12.5 mg Intravenous Given 11/30/21 0507)  haloperidol lactate (HALDOL) injection 5 mg (5 mg Intramuscular Given 11/30/21 0506)  potassium chloride SA (KLOR-CON M) CR tablet 40 mEq (40 mEq Oral Given 11/30/21 0505)  sodium chloride 0.9 % bolus 1,000 mL (1,000 mLs Intravenous New Bag/Given 11/30/21 9718)    ED Course/ Medical Decision Making/ A&P                           Medical Decision Making Amount and/or Complexity of Data Reviewed Labs: ordered.  Risk Prescription drug management.  Patient is a 56 year old female with history of anxiety, GERD, migraines, degenerative disc disease, panic attacks, anal cancer, endometriosis, polysubstance abuse who presents the emergency department complaining of  anxiety after drug ingestion.  Thought that she was taking something to help with "alcohol withdrawal", but began getting increasingly agitated and feeling very anxious.  On exam patient initially tachycardic to 104.  Having difficulty sitting still, and very agitated.  Appears clinically intoxicated.  Able to answer very basic questions, but easily distractible. Denies SI, HI, AVH.   CBC unremarkable.  Potassium 3.0.  Ethanol negative.  Drug screen positive for benzodiazepines and methamphetamines.  Patient given droperidol without improvement.  Patient given Haldol and Benadryl with some improvement, but still complains of feeling uncomfortable.  Given IV fluids and potassium replacement.  Given oral Ativan.  Patient discussed and care transferred to [facility] at shift change. Please see his/her note for further details regarding further ED course and disposition. Plan at time of handoff is reassess patient after finishing IV fluids. Prior to handoff patient still appeared agitated, just received PO ativan. Likely d/c to home with substance abuse resources if patient clinically sobers up. Patient should be able to eat and ambulate without difficulty before d/c.  Final Clinical Impression(s) / ED Diagnoses Final diagnoses:  Substance abuse ([place])  Poisoning by methamphetamines accidental (unintentional), initial encounter [facility])    Rx / DC Orders ED Discharge Orders     None      Portions of this report may have been transcribed using voice recognition software. Every effort was made to ensure accuracy; however, inadvertent computerized transcription errors may be present.    Estill Cotta 11/30/21 2099    Quintella Reichert, MD 12/08/21 [phone]

## 2021-11-30 NOTE — Discharge Instructions (Addendum)
You were seen in the emergency department after drug ingestion.  Your potassium level was slightly low, and we gave you some replacement. Otherwise your blood work was normal. Your drug screen was positive for methamphetamines and benzodiazepines.   I have attached some resources for substance abuse/rehabilitation in the area.   Continue to monitor how you're doing and return to the ER for new or worsening symptoms.

## 2021-11-30 NOTE — ED Triage Notes (Addendum)
Pt BIB EMS with reports of drug use. Pt is moving her arms and legs strange and was just checked out of rehab yesterday. Pt is in the triage room flailing her arms and legs and picking things up off of the floor.
